# Patient Record
Sex: Male | Born: 1960
Health system: Southern US, Community
[De-identification: ages and names within clinical notes are randomized; demographics above are authoritative.]

## PROBLEM LIST (undated history)

## (undated) DIAGNOSIS — F32A Depression, unspecified: Secondary | ICD-10-CM

## (undated) DIAGNOSIS — R000 Tachycardia, unspecified: Secondary | ICD-10-CM

## (undated) DIAGNOSIS — I219 Acute myocardial infarction, unspecified: Secondary | ICD-10-CM

## (undated) DIAGNOSIS — G4730 Sleep apnea, unspecified: Secondary | ICD-10-CM

## (undated) DIAGNOSIS — I10 Essential (primary) hypertension: Secondary | ICD-10-CM

## (undated) DIAGNOSIS — I509 Heart failure, unspecified: Secondary | ICD-10-CM

## (undated) HISTORY — PX: OTHER SURGICAL HISTORY: SHX169

## (undated) HISTORY — PX: CHOLECYSTECTOMY: SHX55

## (undated) HISTORY — DX: Depression, unspecified: F32.A

## (undated) HISTORY — DX: Acute myocardial infarction, unspecified: I21.9

---

## 1998-09-17 ENCOUNTER — Emergency Department (HOSPITAL_COMMUNITY): Admission: EM | Admit: 1998-09-17 | Discharge: 1998-09-17 | Payer: Self-pay | Admitting: Emergency Medicine

## 2000-02-09 ENCOUNTER — Encounter: Payer: Self-pay | Admitting: Emergency Medicine

## 2000-02-09 ENCOUNTER — Emergency Department (HOSPITAL_COMMUNITY): Admission: EM | Admit: 2000-02-09 | Discharge: 2000-02-09 | Payer: Self-pay | Admitting: Emergency Medicine

## 2000-02-26 ENCOUNTER — Emergency Department (HOSPITAL_COMMUNITY): Admission: EM | Admit: 2000-02-26 | Discharge: 2000-02-27 | Payer: Self-pay | Admitting: Emergency Medicine

## 2000-02-27 ENCOUNTER — Emergency Department (HOSPITAL_COMMUNITY): Admission: EM | Admit: 2000-02-27 | Discharge: 2000-02-27 | Payer: Self-pay | Admitting: Emergency Medicine

## 2000-05-21 ENCOUNTER — Emergency Department (HOSPITAL_COMMUNITY): Admission: EM | Admit: 2000-05-21 | Discharge: 2000-05-21 | Payer: Self-pay

## 2001-05-23 ENCOUNTER — Encounter: Payer: Self-pay | Admitting: Emergency Medicine

## 2001-05-23 ENCOUNTER — Emergency Department (HOSPITAL_COMMUNITY): Admission: EM | Admit: 2001-05-23 | Discharge: 2001-05-23 | Payer: Self-pay | Admitting: Emergency Medicine

## 2002-11-27 ENCOUNTER — Emergency Department (HOSPITAL_COMMUNITY): Admission: EM | Admit: 2002-11-27 | Discharge: 2002-11-27 | Payer: Self-pay | Admitting: Emergency Medicine

## 2002-11-28 ENCOUNTER — Emergency Department (HOSPITAL_COMMUNITY): Admission: EM | Admit: 2002-11-28 | Discharge: 2002-11-28 | Payer: Self-pay | Admitting: Emergency Medicine

## 2003-05-03 ENCOUNTER — Inpatient Hospital Stay (HOSPITAL_COMMUNITY): Admission: EM | Admit: 2003-05-03 | Discharge: 2003-05-03 | Payer: Self-pay | Admitting: Emergency Medicine

## 2003-06-17 ENCOUNTER — Encounter: Admission: RE | Admit: 2003-06-17 | Discharge: 2003-06-17 | Payer: Self-pay | Admitting: Internal Medicine

## 2005-08-05 ENCOUNTER — Emergency Department (HOSPITAL_COMMUNITY): Admission: EM | Admit: 2005-08-05 | Discharge: 2005-08-06 | Payer: Self-pay | Admitting: Emergency Medicine

## 2006-01-10 ENCOUNTER — Ambulatory Visit: Payer: Self-pay | Admitting: Family Medicine

## 2006-01-24 ENCOUNTER — Ambulatory Visit: Payer: Self-pay | Admitting: *Deleted

## 2006-10-17 ENCOUNTER — Encounter (INDEPENDENT_AMBULATORY_CARE_PROVIDER_SITE_OTHER): Payer: Self-pay | Admitting: *Deleted

## 2006-11-15 ENCOUNTER — Emergency Department (HOSPITAL_COMMUNITY): Admission: EM | Admit: 2006-11-15 | Discharge: 2006-11-15 | Payer: Self-pay | Admitting: Emergency Medicine

## 2007-01-25 ENCOUNTER — Inpatient Hospital Stay (HOSPITAL_COMMUNITY): Admission: EM | Admit: 2007-01-25 | Discharge: 2007-01-29 | Payer: Self-pay | Admitting: Emergency Medicine

## 2007-01-25 ENCOUNTER — Encounter (INDEPENDENT_AMBULATORY_CARE_PROVIDER_SITE_OTHER): Payer: Self-pay | Admitting: Surgery

## 2008-10-11 ENCOUNTER — Emergency Department (HOSPITAL_COMMUNITY): Admission: EM | Admit: 2008-10-11 | Discharge: 2008-10-11 | Payer: Self-pay | Admitting: Emergency Medicine

## 2008-10-11 ENCOUNTER — Observation Stay (HOSPITAL_COMMUNITY): Admission: EM | Admit: 2008-10-11 | Discharge: 2008-10-12 | Payer: Self-pay | Admitting: Emergency Medicine

## 2008-10-13 ENCOUNTER — Emergency Department (HOSPITAL_COMMUNITY): Admission: EM | Admit: 2008-10-13 | Discharge: 2008-10-14 | Payer: Self-pay | Admitting: Emergency Medicine

## 2008-10-13 ENCOUNTER — Emergency Department (HOSPITAL_COMMUNITY): Admission: EM | Admit: 2008-10-13 | Discharge: 2008-10-13 | Payer: Self-pay | Admitting: Emergency Medicine

## 2008-10-16 ENCOUNTER — Ambulatory Visit: Payer: Self-pay | Admitting: Family Medicine

## 2008-10-16 ENCOUNTER — Encounter: Payer: Self-pay | Admitting: Family Medicine

## 2008-10-16 DIAGNOSIS — I10 Essential (primary) hypertension: Secondary | ICD-10-CM

## 2008-10-16 DIAGNOSIS — E785 Hyperlipidemia, unspecified: Secondary | ICD-10-CM

## 2008-10-16 DIAGNOSIS — E119 Type 2 diabetes mellitus without complications: Secondary | ICD-10-CM | POA: Insufficient documentation

## 2008-10-16 LAB — CONVERTED CEMR LAB
ALT: 26 units/L (ref 0–53)
AST: 25 units/L (ref 0–37)
Albumin: 4.2 g/dL (ref 3.5–5.2)
Alkaline Phosphatase: 61 units/L (ref 39–117)
BUN: 13 mg/dL (ref 6–23)
CO2: 23 meq/L (ref 19–32)
Calcium: 9.7 mg/dL (ref 8.4–10.5)
Chloride: 99 meq/L (ref 96–112)
Creatinine, Ser: 0.82 mg/dL (ref 0.40–1.50)
Direct LDL: 97 mg/dL
Glucose, Bld: 207 mg/dL — ABNORMAL HIGH (ref 70–99)
Hgb A1c MFr Bld: >14 %
Potassium: 4 meq/L (ref 3.5–5.3)
Sodium: 136 meq/L (ref 135–145)
Total Bilirubin: 0.4 mg/dL (ref 0.3–1.2)
Total Protein: 7.8 g/dL (ref 6.0–8.3)

## 2008-10-30 ENCOUNTER — Encounter: Payer: Self-pay | Admitting: Family Medicine

## 2008-10-30 ENCOUNTER — Ambulatory Visit: Payer: Self-pay | Admitting: Family Medicine

## 2008-10-30 DIAGNOSIS — M79609 Pain in unspecified limb: Secondary | ICD-10-CM | POA: Insufficient documentation

## 2008-10-30 DIAGNOSIS — M25569 Pain in unspecified knee: Secondary | ICD-10-CM | POA: Insufficient documentation

## 2008-12-16 ENCOUNTER — Telehealth: Payer: Self-pay | Admitting: Family Medicine

## 2008-12-17 ENCOUNTER — Telehealth: Payer: Self-pay | Admitting: *Deleted

## 2009-06-17 ENCOUNTER — Encounter: Payer: Self-pay | Admitting: *Deleted

## 2009-06-17 ENCOUNTER — Telehealth (INDEPENDENT_AMBULATORY_CARE_PROVIDER_SITE_OTHER): Payer: Self-pay | Admitting: *Deleted

## 2009-06-23 ENCOUNTER — Telehealth: Payer: Self-pay | Admitting: Family Medicine

## 2009-07-01 ENCOUNTER — Encounter: Payer: Self-pay | Admitting: *Deleted

## 2009-07-23 ENCOUNTER — Encounter: Admission: RE | Admit: 2009-07-23 | Discharge: 2009-07-23 | Payer: Self-pay | Admitting: Family Medicine

## 2009-08-18 ENCOUNTER — Emergency Department (HOSPITAL_COMMUNITY): Admission: EM | Admit: 2009-08-18 | Discharge: 2009-08-18 | Payer: Self-pay | Admitting: Emergency Medicine

## 2009-08-18 ENCOUNTER — Ambulatory Visit (HOSPITAL_COMMUNITY): Admission: RE | Admit: 2009-08-18 | Discharge: 2009-08-18 | Payer: Self-pay | Admitting: Interventional Cardiology

## 2009-08-18 ENCOUNTER — Encounter (INDEPENDENT_AMBULATORY_CARE_PROVIDER_SITE_OTHER): Payer: Self-pay | Admitting: Interventional Cardiology

## 2009-10-25 ENCOUNTER — Telehealth: Payer: Self-pay | Admitting: *Deleted

## 2009-11-03 ENCOUNTER — Ambulatory Visit (HOSPITAL_COMMUNITY): Admission: RE | Admit: 2009-11-03 | Discharge: 2009-11-03 | Payer: Self-pay | Admitting: Interventional Cardiology

## 2009-11-03 ENCOUNTER — Encounter (INDEPENDENT_AMBULATORY_CARE_PROVIDER_SITE_OTHER): Payer: Self-pay | Admitting: Interventional Cardiology

## 2009-11-04 ENCOUNTER — Encounter: Payer: Self-pay | Admitting: Family Medicine

## 2009-11-29 ENCOUNTER — Ambulatory Visit: Payer: Self-pay | Admitting: Family Medicine

## 2009-11-29 DIAGNOSIS — M549 Dorsalgia, unspecified: Secondary | ICD-10-CM | POA: Insufficient documentation

## 2009-12-06 ENCOUNTER — Telehealth: Payer: Self-pay | Admitting: Pharmacist

## 2010-02-09 ENCOUNTER — Telehealth: Payer: Self-pay | Admitting: Family Medicine

## 2010-02-20 ENCOUNTER — Encounter: Payer: Self-pay | Admitting: Internal Medicine

## 2010-03-03 NOTE — Progress Notes (Signed)
Summary: triage   Phone Note Call from Patient Call back at [phone]   Caller: Patient Summary of Call: Pt needs a couple of lantus pens due to he can not afford to get them or even come in for an appointment. Initial call taken by: Clydell Hakim,  Jun 23, 2009 11:01 AM  Follow-up for Phone Call        reviewed the conversations we had about this before. states he will bring $25 to appt Fri. will see Dr. Lanier Prude.   plz discuss dnkas & completing the [facility] application Follow-up by: Golden Circle RN,  Jun 23, 2009 11:08 AM

## 2010-03-03 NOTE — Letter (Signed)
Summary: Probation Letter  [facility]  [address]   [place], [place] [address]   Phone: [phone]  Fax: [phone]    07/01/2009  Cody Nolan [address] [place], [place]  [address]  Dear Cody Nolan,  With the goal of better serving all our patients the [facility] is following each patient's missed appointments.  You have missed at least 3 appointments with our practice.If you cannot keep your appointment, we expect you to call at least 24 hours before your appointment time.  Missing appointments prevents other patients from seeing [place] and makes it difficult to provide you with the best possible medical care.      1.   If you miss one more appointment, we will only give you limited medical services. This means we will not call in medication refills, complete a form, or make a referral for you except when you are here for a scheduled office visit.    2.   If you miss 2 or more appointments in the next year, we will dismiss you from our practice.    Our office staff can be reached at [phone] Monday through Friday from 8:30 a.m.-5:00 p.m. and will be glad to schedule your appointment as necessary.    Thank you.   [facility]  Appended Document: Probation Letter Letter returned unable to forward.

## 2010-03-03 NOTE — Progress Notes (Signed)
Summary: triage   Phone Note Call from Patient Call back at Home Phone [phone]   Caller: Patient Summary of Call: Wondering if he can get some insulin samples. Initial call taken by: Clydell Hakim,  October 25, 2009 8:42 AM  Follow-up for Phone Call        he will pick samples up tomorrow Follow-up by: Golden Circle RN,  October 25, 2009 8:56 AM

## 2010-03-03 NOTE — Progress Notes (Signed)
Summary: triage   Phone Note Call from Patient Call back at [phone]   Caller: Patient Summary of Call: Pt wondering if we have any Lantus pens. He is out and was told to call us if he ran out. Initial call taken by: Clydell Hakim,  Jun 17, 2009 11:02 AM  Follow-up for Phone Call        he has not been here since 10/10. tried to get Jaynee Eagles but is missing the notarized statement that he is living with grandmom.  But, he proceeded to say she was in [place] with his aunt. the grandma does not walk & no one will come the the [place] to [place]arize it. he does not have a plan to resolve this.  told him I will check with pcp & will call him back with her response.   states he has been without his insulin for months but feels it is up now. send to ED? Follow-up by: Golden Circle RN,  Jun 17, 2009 11:16 AM  Additional Follow-up for Phone Call Additional follow up Details #1::        He needs to have an office visit to discuss this.  when I check the appt tab, it shows that he has no showed for 3 appts; so I am unwilling to provide any services over the phone.  he must come in for follow up.  thanks Additional Follow-up by: Asher Muir MD,  Jun 17, 2009 11:49 AM    Additional Follow-up for Phone Call Additional follow up Details #2::    told him he must be seen. cannot come today . wanted am appt with pcp next week. told him he really needed to be seen or do to ED today or tomorrow. he is helping someone m,ove tomorrow & refused an appt. appt for monday at 8:30. aware he will not be seeing his pcp. told him to go to ED if feeling bad  he needs to get the [facility] requirements DONE.   multiple DNKAs. to Dennison Nancy, RN, Chiropodist  spoke with Abundio Miu. He will need to bring at least $25 with him to the appt. I called him right back buit had to leave a VM asking him to call me Follow-up by: Golden Circle RN,  Jun 17, 2009 12:03 PM   Appended Document:  triage told him to bring $25 to visit next week. he said he would work on getting this & hung up

## 2010-03-03 NOTE — Miscellaneous (Signed)
Summary: needs insulin   Clinical Lists Changes states he only got one pen last time he requested them. told him we are dependant on how many samples we get & how many are given away by other mds in clinic. apparantley we only had one to give him. asked if he had another plan for getting his insulin. states he does but needs more samples. told him about the [facility] & probation staus. offered him an appt tomorrow. says his back hurts so bad he cannot get out of bed. took appt next wk with his pcp.Golden Circle RN  November 04, 2009 3:32 PM

## 2010-03-03 NOTE — Progress Notes (Signed)
Summary: needs sample   Phone call to patient. Discussed need to find a way to get a notorized living situaton document for MAP.   Called and provided one pen for next week - until his appt with Dr. Ellin Mayhew.   Phone Note Call from Patient Call back at Home Phone [phone]   Caller: Patient Summary of Call: needs another samples of Lantus of 260 units Initial call taken by: De Nurse,  December 06, 2009 9:05 AM  Follow-up for Phone Call        I discussed with patient at his last appointment that we do not have samples available for him for chronic use.  He has medicaid which covers  it.  Please ask him to pickup his refill from the pharmacy and if he woudl like for me to prescribe the vials instead of the lantus pen I would be glad to do so. Follow-up by: Delbert Harness MD,  December 07, 2009 10:01 AM  Additional Follow-up for Phone Call Additional follow up Details #1::        Spoke with pt and he states that because he doesn't have the [place] access medicaid they will only pay for insulin when rx'ed during cpe visit. He states that medicaid will not cover his pen and that even if he is rx'ed vials it will not make a difference. He also states that he is out of insulin and is in need of badly Additional Follow-up by: Jimmy Footman, CMA,  December 07, 2009 10:44 AM    Additional Follow-up for Phone Call Additional follow up Details #2::    Please let patient know we do not have samples to give him.  Ask him how much his copay is for lantus.  Tell him info to contact Rudell Cobb to discuss if he qualifies for any other assistance.  Let him know if he cannot afford lantus, that we can discuss other insulins if they are less expensive for the patient (office appt). thanks  Additional Follow-up for Phone Call Additional follow up Details #3:: Details for Additional Follow-up Action Taken: spoke with pt and gave him phone number for [facility], he will contact her but he informed me that  he spoke with pete and he told the pt that he should never be without insulin and if he needed some to ask him,. i will send a flag to pete. also he has an appt for the 22nd i think Additional Follow-up by: Loralee Pacas CMA,  December 08, 2009 3:37 PM

## 2010-03-03 NOTE — Miscellaneous (Signed)
Summary: he cancelled the appt   Clinical Lists Changes he called back & said to cancel the appt . he has no money. urged him to try to get with [facility] earlier to get that. told him there are other clinics he can try that may have a sliding scale. he said he would be back here when he got the money.Golden Circle RN  Jun 17, 2009 4:46 PM

## 2010-03-03 NOTE — Assessment & Plan Note (Signed)
Summary: pain & get insulin/Quin[place]/briscoe   Vital Signs:  Patient profile:   50 year old male Weight:      266 pounds Temp:     98.9 degrees F oral Pulse rate:   99 / minute Pulse rhythm:   regular BP sitting:   108 / 81  (right arm) Cuff size:   large  Vitals Entered By: Loralee Pacas CMA (November 29, 2009 9:53 AM) CC: refill meds Is Patient Diabetic? Yes Did you bring your meter with you today? No   Primary Care Shaira Sova:  Cody Harness MD  CC:  refill meds.  History of Present Illness: 50 yo here to meet new MD.  Has not been seen in over a year.  DIABETES Meds:Metformin 500 two times a day, if takes more, gets GI upset, Lantus, glimepiride Taking and tolerating? yes Blood sugars:not checking regularly Hypoglycemic symptoms: no Visual problems: no Monitoring feet: some Numbness/Tingling: no Last eye exam: overdue Last A1c: > 14 a year ago Flu/PNA vaccines? deferred  HYPERTENSION Meds: Taking and tolerating? yes Home BP's: no Chest Pain: no Dyspnea: no  HYPERLIPIDEMIA Meds: simva 40 Taking and tolerating? 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  No LE weakness, radiculpathy, bowel or bladder changes,  numbness tingling, loss of sensation.  managing with tylenol.       Habits & Providers  Alcohol-Tobacco-Diet     Tobacco Status: current     Tobacco Counseling: to quit use of tobacco products     Cigarette Packs/Day: 1.5 per week  Exercise-Depression-Behavior     Does Patient Exercise: no     Exercise Counseling: to improve exercise regimen     Have you felt down or hopeless? no     Have you felt little pleasure in things? no     Depression Counseling: not indicated; screening negative for depression     Seat Belt Use: always  Current Medications (verified): 1)  Metformin Hcl 1000 Mg Tabs (Metformin Hcl) .... Take One Half Tablet Twice A Day 2)   Metoprolol Tartrate 25 Mg Tabs (Metoprolol Tartrate) .Marland Kitchen.. 1 Tab By Mouth Two Times A Day 3)  Simvastatin 40 Mg Tabs (Simvastatin) .Marland Kitchen.. 1 Tab By Mouth At Bedtime For Cholesterol 4)  Ramipril 2.5 Mg Caps (Ramipril) .... Take One Tablet Daily 5)  Aspirin 81 Mg Chew (Aspirin) .Marland Kitchen.. 1 Tab By Mouth Daily 6)  Lantus Solostar 100 Unit/ml Soln (Insulin Glargine) .... 30 Units Subcutaneously in The Morning.  Dispense Qs One Month 7)  Relion Confirm Glucose Monitor W/device Kit (Blood Glucose Monitoring Suppl) 8)  Relion Blood Glucose Test  Strp (Glucose Blood) .... Test Blood Sugar As Directed 9)  Glimepiride 4 Mg Tabs (Glimepiride) .Marland Kitchen.. 1 Tab By Mouth Daily For Diabetes 10)  Diclofenac Potassium 50 Mg Tabs (Diclofenac Potassium) .... Take One Tablet Three Times A Day As Needed Pain 11)  Amitriptyline Hcl 50 Mg Tabs (Amitriptyline Hcl) .... 2 Tabs As Needed Bedtime  Past History:  Past Medical History: diabetes mellitus MI at in 2003; no stent allergic rhinitis htn fracture of L5 in his 33s.  no surgery.  had a TENS unit hld some type of arrhythmia  Social History: Smoking Status:  current Packs/Day:  1.5 per week Does Patient Exercise:  no Seat Belt Use:  always  Review of Systems      See HPI  Physical Exam  General:  Well-developed,overweight,  pleasant and cooperative Neck:  No deformities, masses, or tenderness noted. Lungs:  Normal respiratory effort, chest expands symmetrically. Lungs are clear to auscultation, no crackles or wheezes. Heart:  Normal rate and regular rhythm. S1 and S2 normal without gallop, murmur, click, rub or other extra sounds. Abdomen:  Bowel sounds positive,abdomen soft and non-tender without masses, organomegaly or hernias noted. Msk:  no TTP over spine Extremities:  no edema Neurologic:  alert & oriented X3, cranial nerves II-XII intact, and DTRs symmetrical and normal.   Psych:  Oriented X3, memory intact for recent and remote, and normally interactive.       Impression & Recommendations:  Problem # 1:  DM (ICD-250.00) History of being poorly controlled.  He states he has been out  meds recently due to being out of meds and having difficulty financially.  Discussed with patient that medicaid will cover his medications.  I gave him one Lantus pen, he prefers this to the syringes.  Will refill meds. Patient feels like he woul dbenefit from nutritional education.  He has been to diabetes educationi in the past.  He is educated and would likely gain a great deal from nutrition consult.  Will refer to Dr. Gerilyn Pilgrim.  His updated medication list for this problem includes:    Metformin Hcl 1000 Mg Tabs (Metformin hcl) .Marland Kitchen... Take one half tablet twice a day    Ramipril 2.5 Mg Caps (Ramipril) .Marland Kitchen... Take one tablet daily    Aspirin 81 Mg Chew (Aspirin) .Marland Kitchen... 1 tab by mouth daily    Lantus Solostar 100 Unit/ml Soln (Insulin glargine) .Marland KitchenMarland KitchenMarland KitchenMarland Kitchen 30 units subcutaneously in the morning.  dispense qs one month    Glimepiride 4 Mg Tabs (Glimepiride) .Marland Kitchen... 1 tab by mouth daily for diabetes  Orders: [facility]- Est  Level 4 (99214)Future Orders: A1C-[facility] ([address]) ... 11/11/2010 CBC-[facility] (60454) ... 11/11/2010 Comp Met-[facility] ([id number]) ... 11/26/2010  Labs Reviewed: Creat: 0.82 (10/16/2008)    Reviewed HgBA1c results: >14.0  (10/16/2008)  Problem # 2:  ESSENTIAL HYPERTENSION, BENIGN (ICD-401.1) BP is on the low side but would like to keep low level of ACE-I due to DM and CAD.  He thought he did not tolerate lisinopril well but I'm not sure it was an ACE-I cough.  he would be willing to try another ACE-I so will switch to ramipril before diagnosing ACE-I intolerance.   His updated medication list for this problem includes:    Metoprolol Tartrate 25 Mg Tabs (Metoprolol tartrate) .Marland Kitchen... 1 tab by mouth two times a day    Ramipril 2.5 Mg Caps (Ramipril) .Marland Kitchen... Take one tablet daily  Orders: [facility]- Est  Level 4 (99214)Future Orders: CBC-[facility] ([address]) ... 11/11/2010 Lipid-[facility]  ([id number]) ... 11/11/2010 Comp Met-[facility] ([id number]) ... 11/26/2010  BP today: 108/81 Prior BP: 110/76 (10/30/2008)  Labs Reviewed: K+: 4.0 (10/16/2008) Creat: : 0.82 (10/16/2008)     Problem # 3:  HYPERLIPIDEMIA (ICD-272.4) Will return for fasting lipids.  His updated medication list for this problem includes:    Simvastatin 40 Mg Tabs (Simvastatin) .Marland Kitchen... 1 tab by mouth at bedtime for cholesterol  Orders: [facility]- Est  Level 4 (99214)Future Orders: CBC-[facility] (24401) ... 11/11/2010 Lipid-[facility] ([id number]) ... 11/11/2010 Comp Met-[facility] ([id number]) ... 11/26/2010  Problem # 4:  BACK PAIN (ICD-724.5)  chronic- he asks about referral to a pain clinic- possibly for back injections.  I asked patient to obtain records or some imaging from prior physician and we will discuss referral at the next visit.  His updated medication list for this  problem includes:    Aspirin 81 Mg Chew (Aspirin) .Marland Kitchen... 1 tab by mouth daily    Diclofenac Potassium 50 Mg Tabs (Diclofenac potassium) .Marland Kitchen... Take one tablet three times a day as needed pain  Orders: [facility]- Est  Level 4 (16109)  Complete Medication List: 1)  Metformin Hcl 1000 Mg Tabs (Metformin hcl) .... Take one half tablet twice a day 2)  Metoprolol Tartrate 25 Mg Tabs (Metoprolol tartrate) .Marland Kitchen.. 1 tab by mouth two times a day 3)  Simvastatin 40 Mg Tabs (Simvastatin) .Marland Kitchen.. 1 tab by mouth at bedtime for cholesterol 4)  Ramipril 2.5 Mg Caps (Ramipril) .... Take one tablet daily 5)  Aspirin 81 Mg Chew (Aspirin) .Marland Kitchen.. 1 tab by mouth daily 6)  Lantus Solostar 100 Unit/ml Soln (Insulin glargine) .... 30 units subcutaneously in the morning.  dispense qs one month 7)  Relion Confirm Glucose Monitor W/device Kit (Blood glucose monitoring suppl) 8)  Relion Blood Glucose Test Strp (Glucose blood) .... Test blood sugar as directed 9)  Glimepiride 4 Mg Tabs (Glimepiride) .Marland Kitchen.. 1 tab by mouth daily for diabetes 10)  Diclofenac Potassium 50 Mg Tabs (Diclofenac  potassium) .... Take one tablet three times a day as needed pain 11)  Amitriptyline Hcl 50 Mg Tabs (Amitriptyline hcl) .... 2 tabs as needed bedtime  Patient Instructions: 1)  Nice to meet you 2)  Your medicaid should cover lantus solostar pen.  I will send refil to your pharmacy.  if it does not cover, let me know and I wil prescribe the vials. 3)  Make appt up front for Dr. Gerilyn Pilgrim for nutrition consultation 4)  obtain records of any back imaging and records from your last doctor and bring to next appointment 5)  make appt for fasting labs this week.  We will discuss the results at your next appointment 6)  follow-up in 3-4 weeks Prescriptions: AMITRIPTYLINE HCL 50 MG TABS (AMITRIPTYLINE HCL) 2 tabs as needed bedtime  #60 x 5   Entered and Authorized by:   Cody Harness MD   Signed by:   Cody Harness MD on 12/02/2009   Method used:   Electronically to        [facility] [address] (retail)       [phone]       [place], [place]  [address]       Ph: [phone]       Fax: [phone]   RxID:   [id number] LANTUS SOLOSTAR 100 UNIT/ML SOLN (INSULIN GLARGINE) 30 units subcutaneously in the morning.  Dispense QS one month  #1 x 5   Entered and Authorized by:   Cody Harness MD   Signed by:   Cody Harness MD on 12/02/2009   Method used:   Electronically to        [facility] [address] (retail)       [phone]       [place], [place]  [address]       Ph: [phone]       Fax: [phone]   RxID:   [id number] GLIMEPIRIDE 4 MG TABS (GLIMEPIRIDE) 1 tab by mouth daily for diabetes  #30 x 5   Entered and Authorized by:   Cody Harness MD   Signed by:   Cody Harness MD on 12/02/2009   Method used:   Electronically to        [facility] [address] (retail)       [phone]       [place], [place]  [address]       Ph: [phone]       Fax: [phone]   RxID:    [id number] SIMVASTATIN 40 MG TABS (SIMVASTATIN) 1 tab by mouth at bedtime for cholesterol  #30 x 1   Entered and Authorized by:   Cody Harness MD   Signed by:   Cody Harness MD on 12/02/2009   Method used:   Electronically to        [facility] [address] (retail)       [phone]       [place], [place]  [address]       Ph: [phone]       Fax: [phone]   RxID:   [id number] METOPROLOL TARTRATE 25 MG TABS (METOPROLOL TARTRATE) 1 tab by mouth two times a day  #60 x 1   Entered and Authorized by:   Cody Harness MD   Signed by:   Cody Harness MD on 12/02/2009   Method used:   Electronically to        [facility] [address] (retail)       [phone]       [place], [place]  [address]       Ph: [phone]       Fax: [phone]   RxID:   [id number] METFORMIN HCL 1000 MG TABS (METFORMIN HCL) take one half tablet twice a day  #30 x 5   Entered and Authorized by:   Cody Harness MD   Signed by:   Cody Harness MD on 12/02/2009   Method used:   Electronically to        [facility] [address] (retail)       [phone]       [place], [place]  [address]       Ph: [phone]       Fax: [phone]   RxID:   [id number] DICLOFENAC POTASSIUM 50 MG TABS (DICLOFENAC POTASSIUM) take one tablet three times a day as needed pain  #90 x 2   Entered and Authorized by:   Cody Harness MD   Signed by:   Cody Harness MD on 12/02/2009   Method used:   Electronically to        [facility] [address] (retail)       [phone]       [place], [place]  [address]       Ph: [phone]       Fax: [phone]   RxID:   [id number] RAMIPRIL 2.5 MG CAPS (RAMIPRIL) take one tablet daily  #30 x 5   Entered and Authorized by:   Cody Harness MD   Signed by:   Cody Harness MD on 12/02/2009   Method used:   Electronically to        [facility]  [address] (retail)       [phone]       [place], [place]  [address]       Ph: [phone]       Fax: [phone]   RxID:  [id number]    Orders Added: 1)  A1C-[facility] [83036] 2)  CBC-[facility] [85027] 3)  Lipid-[facility] [80061-22930] 4)  Comp Met-[facility] [80053-22900] 5)  [facility]- Est  Level 4 [30865]     Prevention & Chronic Care Immunizations   Influenza vaccine: Not documented    Tetanus booster: Not documented    Pneumococcal vaccine: Not documented  Other Screening   PSA: Not documented   Smoking status: current  (11/29/2009)  Diabetes Mellitus   HgbA1C: >14.0   (10/16/2008)    Eye exam: Not documented    Foot exam: yes  (10/30/2008)   High risk foot: Not documented   Foot care education: Not documented    Urine microalbumin/creatinine ratio: Not documented  Lipids   Total Cholesterol: Not documented   LDL: Not documented   LDL Direct: 97  (10/16/2008)   HDL: Not documented   Triglycerides: Not documented    SGOT (AST): 25  (10/16/2008)   SGPT (ALT): 26  (10/16/2008) CMP ordered    Alkaline phosphatase: 61  (10/16/2008)   Total bilirubin: 0.4  (10/16/2008)  Hypertension   Last Blood Pressure: 108 / 81  (11/29/2009)   Serum creatinine: 0.82  (10/16/2008)   Serum potassium 4.0  (10/16/2008) CMP ordered   Self-Management Support :    Diabetes self-management support: Not documented    Hypertension self-management support: Not documented    Lipid self-management support: Not documented

## 2010-03-03 NOTE — Progress Notes (Signed)
Summary: REFILL  Medications Added METFORMIN HCL 1000 MG TABS (METFORMIN HCL) one tablet twice a day       Phone Note Refill Request Call back at Home Phone [phone] Message from:  Patient  Refills Requested: Medication #1:  METFORMIN HCL 1000 MG TABS take one half tablet twice a day   Notes: IS NOW TAKING 2 PER DAY Initial call taken by: De Nurse,  February 09, 2010 8:34 AM    New/Updated Medications: METFORMIN HCL 1000 MG TABS (METFORMIN HCL) one tablet twice a day Prescriptions: METFORMIN HCL 1000 MG TABS (METFORMIN HCL) one tablet twice a day  #60 x 5   Entered and Authorized by:   Delbert Harness MD   Signed by:   Delbert Harness MD on 02/09/2010   Method used:   Electronically to        [facility] [address] (retail)       [phone]       [place], [place]  [address]       Ph: [phone]       Fax: [phone]   RxID:   [id number]

## 2010-04-14 ENCOUNTER — Telehealth: Payer: Self-pay | Admitting: Family Medicine

## 2010-04-14 NOTE — Telephone Encounter (Signed)
Called pt but line was busy. Wanted to find out which Rx's he was speaking of so that we can be sure to call in the correct ones. I will try again tomorrow.Loralee Pacas [place]

## 2010-04-14 NOTE — Telephone Encounter (Signed)
Pt asking to speak with RN about getting 2 of his meds on the [facility] $4 list, pt cannot afford the ones prescribed.

## 2010-04-15 NOTE — Telephone Encounter (Signed)
Yes, we can go over all of his medications and choose ones that are affordable for him.  Please have him schedule appointment to discuss.  He did not keep last appointment.

## 2010-04-15 NOTE — Telephone Encounter (Signed)
Cody Nolan, Patient is wanting to know if there is any medicines on the [facility] $4 that be be prescribed in place of Ramipril 2.5 and Simvastain 40.

## 2010-04-15 NOTE — Telephone Encounter (Signed)
LVM for patient to call back. ?

## 2010-04-18 NOTE — Telephone Encounter (Signed)
LVM for patient to call back. ?

## 2010-04-18 NOTE — Telephone Encounter (Signed)
Due to numerous phone calls made to patient and no response I have mailed out a letter to him

## 2010-05-06 LAB — POCT I-STAT, CHEM 8
BUN: 12 mg/dL (ref 6–23)
BUN: 8 mg/dL (ref 6–23)
Calcium, Ion: 1.11 mmol/L — ABNORMAL LOW (ref 1.12–1.32)
Calcium, Ion: 1.18 mmol/L (ref 1.12–1.32)
Chloride: 101 mEq/L (ref 96–112)
Chloride: 99 mEq/L (ref 96–112)
Creatinine, Ser: 0.6 mg/dL (ref 0.4–1.5)
Glucose, Bld: 436 mg/dL — ABNORMAL HIGH (ref 70–99)
Glucose, Bld: 602 mg/dL (ref 70–99)
HCT: 43 % (ref 39.0–52.0)
HCT: 46 % (ref 39.0–52.0)
HCT: 47 % (ref 39.0–52.0)
Hemoglobin: 14.6 g/dL (ref 13.0–17.0)
Hemoglobin: 16 g/dL (ref 13.0–17.0)
Potassium: 3.9 mEq/L (ref 3.5–5.1)
Potassium: 4 mEq/L (ref 3.5–5.1)
Potassium: 4.6 mEq/L (ref 3.5–5.1)
Sodium: 126 mEq/L — ABNORMAL LOW (ref 135–145)
Sodium: 133 mEq/L — ABNORMAL LOW (ref 135–145)
TCO2: 23 mmol/L (ref 0–100)
TCO2: 28 mmol/L (ref 0–100)

## 2010-05-06 LAB — DIFFERENTIAL
Basophils Absolute: 0 10*3/uL (ref 0.0–0.1)
Basophils Relative: 0 % (ref 0–1)
Lymphocytes Relative: 21 % (ref 12–46)
Lymphocytes Relative: 27 % (ref 12–46)
Lymphs Abs: 2.3 10*3/uL (ref 0.7–4.0)
Monocytes Absolute: 0.9 10*3/uL (ref 0.1–1.0)
Neutro Abs: 5.6 10*3/uL (ref 1.7–7.7)
Neutro Abs: 7.5 10*3/uL (ref 1.7–7.7)
Neutrophils Relative %: 59 % (ref 43–77)
Neutrophils Relative %: 68 % (ref 43–77)

## 2010-05-06 LAB — TSH: TSH: 1.934 u[IU]/mL (ref 0.350–4.500)

## 2010-05-06 LAB — CBC
Hemoglobin: 14.1 g/dL (ref 13.0–17.0)
Hemoglobin: 14.2 g/dL (ref 13.0–17.0)
MCHC: 34.1 g/dL (ref 30.0–36.0)
Platelets: 208 10*3/uL (ref 150–400)
RBC: 4.71 MIL/uL (ref 4.22–5.81)
RDW: 13.7 % (ref 11.5–15.5)
RDW: 14.1 % (ref 11.5–15.5)

## 2010-05-06 LAB — BLOOD GAS, ARTERIAL
Acid-Base Excess: 0.6 mmol/L (ref 0.0–2.0)
O2 Saturation: 93.9 %
Patient temperature: 98.6
pCO2 arterial: 41.5 mmHg (ref 35.0–45.0)

## 2010-05-06 LAB — GLUCOSE, CAPILLARY
Glucose-Capillary: 163 mg/dL — ABNORMAL HIGH (ref 70–99)
Glucose-Capillary: 200 mg/dL — ABNORMAL HIGH (ref 70–99)
Glucose-Capillary: 230 mg/dL — ABNORMAL HIGH (ref 70–99)
Glucose-Capillary: 248 mg/dL — ABNORMAL HIGH (ref 70–99)
Glucose-Capillary: 251 mg/dL — ABNORMAL HIGH (ref 70–99)
Glucose-Capillary: 254 mg/dL — ABNORMAL HIGH (ref 70–99)
Glucose-Capillary: 291 mg/dL — ABNORMAL HIGH (ref 70–99)
Glucose-Capillary: 310 mg/dL — ABNORMAL HIGH (ref 70–99)
Glucose-Capillary: 417 mg/dL — ABNORMAL HIGH (ref 70–99)
Glucose-Capillary: 438 mg/dL — ABNORMAL HIGH (ref 70–99)
Glucose-Capillary: 480 mg/dL — ABNORMAL HIGH (ref 70–99)

## 2010-05-06 LAB — URINALYSIS, ROUTINE W REFLEX MICROSCOPIC
Hgb urine dipstick: NEGATIVE
Leukocytes, UA: NEGATIVE
Nitrite: NEGATIVE
Protein, ur: NEGATIVE mg/dL
Specific Gravity, Urine: 1.037 — ABNORMAL HIGH (ref 1.005–1.030)
Urobilinogen, UA: 0.2 mg/dL (ref 0.0–1.0)

## 2010-05-06 LAB — POCT CARDIAC MARKERS
CKMB, poc: 2.2 ng/mL (ref 1.0–8.0)
Myoglobin, poc: 69.2 ng/mL (ref 12–200)
Troponin i, poc: <0.05 ng/mL (ref 0.00–0.09)

## 2010-05-06 LAB — URINE MICROSCOPIC-ADD ON

## 2010-05-06 LAB — CK TOTAL AND CKMB (NOT AT ARMC): Relative Index: 1.9 (ref 0.0–2.5)

## 2010-05-06 LAB — T4, FREE: Free T4: 1.15 ng/dL (ref 0.80–1.80)

## 2010-05-06 LAB — TROPONIN I: Troponin I: 0.01 ng/mL (ref 0.00–0.06)

## 2010-06-14 NOTE — H&P (Signed)
NAME:  Cody Nolan, Cody Nolan NO.:  [id number]   MEDICAL RECORD NO.:  [id number]          PATIENT TYPE:  EMS   LOCATION:  ED                           FACILITY:  [facility]   PHYSICIAN:  Clovis Pu. Cornett, M.D.DATE OF BIRTH:  [birth year]   DATE OF ADMISSION:  01/25/2007  DATE OF DISCHARGE:                              HISTORY & PHYSICAL   CHIEF COMPLAINT:  Right upper quadrant pain.   HISTORY OF PRESENT ILLNESS:  The patient is a 50 year old male with a 12-  hour history of severe sharp right upper quadrant pain.  The pain  started suddenly at 3:00 a.m. this morning and awoke him from sleep.  The pain is sharp in nature at 10/10, radiates to his back and then  towards his right lower quadrant.  Also has pain in his epigastric.  Nothing seems to make the pain go away.  It is associated nausea,  vomiting and chills.  I was asked to see him at the request has  emergency room because of these findings and because his ultrasound  showed gallstones and thickened gallbladder wall worrisome for acute  cholecystitis.  He has a history of hypertension essential and  hypercholesterolemia and questionable history of coronary artery  disease.   PAST SURGICAL HISTORY:  Right knee arthroscopy.   FAMILY HISTORY:  Noncontributory.   PAST MEDICAL HISTORY:  1. Questionable coronary artery disease.  2. Essentially hypertension.  3. Hypercholesterolemia.   ALLERGIES:  PENICILLIN, SULFA, EGG WHITES AND EGGS.   SOCIAL HISTORY:  Denies tobacco or alcohol use.  He is accompanied by  his wife.   MEDICATIONS:  Norvasc and Toprol XL, but he is not currently taking this  medication.   REVIEW OF SYSTEMS:  Negative times 15 except for that as stated above.   PHYSICAL EXAMINATION:  VITAL SIGNS:  Temperature 97, pulse 101, blood  pressure 117/63.  GENERAL APPEARANCE:  Pleasant male in no apparent distress with chills  underneath his coat and blanket.  HEENT:  No evidence of scleral icterus.   Oropharynx is moist.  NECK:  Supple, nontender.  CHEST:  Clear to auscultation.  Chest wall motion is normal.  CARDIOVASCULAR:  Regular rate and rhythm without murmur or gallop.  EXTREMITIES:  Warm, well-perfused.  ABDOMEN:  He is tender to upper quadrant with positive Murphy's sign.  No mass.  No hernia.  EXTREMITIES:  Muscle tone normal range of motion normal.  NEUROLOGICAL:  Glasgow coma scale is 15.  Motor and sensory function are  grossly intact.   DIAGNOSTIC STUDIES:  Reviewed his ultrasound which shows gallstones and  thickened gallbladder wall.  He has a white count 10,900, hemoglobin  13.8, platelet count 274,000.  There is no left shift.  Urinalysis is  unremarkable.  Sodium 139, potassium 3.9, chloride 106, CO2 24, BUN 7,  creatinine 0.29, glucose 135, lipase is 23.  His AST is 19, ALT 20, alk  phos normal at 59.   IMPRESSION:  Acute cholecystitis with past medical history of  hypertension, questionable heart disease and hypercholesterolemia.   PLAN:  Medicine has seen him and cleared him  for surgery.  Will go ahead  and get him scheduled for a laparoscopic cholecystectomy with  cholangiogram for acute cholecystitis.  Risk of bleeding, infection,  common duct injury, injury to other organs to include stomach, small-  bowel, liver were all discussed with the patient today as well as the  need for conversion to an open procedure and the potential need for ERCP  and other procedures on the common bile duct if he has common duct  stones.  He understands and agrees to proceed.      Thomas A. Cornett, M.D.  Electronically Signed     TAC/MEDQ  D:  01/25/2007  T:  01/25/2007  Job:  [id number]

## 2010-06-14 NOTE — Op Note (Signed)
NAME:  Cody Nolan, CARD NO.:  [id number]   MEDICAL RECORD NO.:  [id number]          PATIENT TYPE:  INP   LOCATION:  1317                         FACILITY:  [facility]   PHYSICIAN:  Clovis Pu. Cornett, M.D.DATE OF BIRTH:  Mar 02, 1960   DATE OF PROCEDURE:  01/25/2007  DATE OF DISCHARGE:                               OPERATIVE REPORT   PREOPERATIVE DIAGNOSIS:  Acute cholecystitis.   POSTOPERATIVE DIAGNOSIS:  Acute gangrenous cholecystitis.   PROCEDURE:  Laparoscopic cholecystectomy with intraoperative  cholangiogram.   SURGEON:  Thomas A. Cornett, M.D.   ASSISTANT:  OR staff.   ANESTHESIA:  General endotracheal anesthesia with 0.25% Sensorcaine  local.   ESTIMATED BLOOD LOSS:  50 mL.   DRAINS:  None.   SPECIMEN:  Gallbladder with large gallstones to pathology.   INDICATIONS FOR PROCEDURE:  The patient is a 50 year old male with a one  week history of abdominal pain, nausea, and vomiting.  He was found to  have acute cholecystitis upon examination and ultrasound workup.  Laparoscopic cholecystectomy is recommended for acute cholecystitis.  He  received medical clearance, given his history of hypertension and  hypercholesterolemia.  He was given a moderate risks of adverse cardiac  events given his comorbidities.  Complications of bleeding, infection,  common duct injury, conversion to an open procedure were all discussed  with the patient.  He voiced understanding and agreed to proceed.   DESCRIPTION OF PROCEDURE:  The patient was brought to the operating room  and placed supine.  After induction of general endotracheal anesthesia,  his abdomen was prepped and draped in a sterile fashion.  A 1 cm  supraumbilical incision was made.  Dissection was carried down to his  fascia. His fascia was grasped with Kochers and a small incision was  made in the fascia using a scalpel.  I used my finger to push through  the peritoneal lining in the abdominal cavity and swept  around and found  no intra-abdominal adhesions.  A pursestring suture of 0 Vicryl was  placed, a 12 mm Hassan cannula was placed under direct vision.  Pneumoperitoneum was created to 15 mmHg of CO2 and the laparoscope was  placed.  The gallbladder was acutely inflamed.  A subxiphoid port  consisting of an 11 mL port was placed under direct vision.  Two 5 mm  ports were placed in the right upper quadrant, both under direct vision.   The gallbladder was decompressed with a decompression needle.  I then  grasped the dome and pushed it toward the patient's right shoulder.  A  second grasper was used to grasp the infundibulum of the gallbladder and  pull it toward the right lower quadrant.  The patient was placed in  reversed Trendelenburg and rolled to his left.  Of note, he did have  some transient hypotension during the case which responded to a very low  dose of Neo-Synephrine and IV fluids.  The gallbladder was edematous and  acutely inflamed.  I was able to push the fat away from the neck of the  gallbladder to identify the cystic duct and then dissect  the cystic duct  circumferentially as the only tubular structure entering the  gallbladder.  I was able to make a small incision in the cystic duct  after achieving a critical view.  After opening the cystic duct  partially, through a separate stab wound, a Cook cholangiogram catheter  was introduced and held in place in the cystic duct via clip.  Intraoperative cholangiogram using one 1/2 strength Hypaque dye was  used.  There was free flow of contrast down a tortuous cystic duct into  the common duct with immediate flushing into the duodenum.  Contrast  went up the common hepatic duct and into the bifurcation of right and  left hepatic duct and ductules.  No signs of extravasation injury or a  stone that I could see.  There was minimal dilatation.   At this point, I removed the catheter.  I placed four clips on the  cystic duct stump  and divided it.  The cystic artery identified, it was  controlled between clips and divided.  There was a posterior branch, as  well, which we controlled with clips.  Cautery was used to dissect the  gallbladder from the gallbladder fossa.  This was done all the way until  we encountered the dome.  This side of the gallbladder appeared  gangrenous to me.  There was no evidence of perforation of the  gallbladder, otherwise, or spillage of stones.  The gallbladder was then  placed in an EndoCatch bag.  I inspected the gallbladder bed and the  clips on the cystic duct and cystic artery and branches respectively.  These were hemostatic.  There was some oozing and I placed a small  amount of Surgicel in the gallbladder bed for this.  Hemostasis,  otherwise, was excellent.  Irrigation was used and suctioned out.  I  extracted the gallbladder from the umbilicus in an EndoCatch bag and  passed it off the field.   We then inspected the abdominal cavity and found no injury to bowel or  bladder or other solid organ.  We then extracted our instruments,  allowed the CO2 to escape and closed the port site under direct vision,  prior to doing this, with the pursestring suture of 0 Vicryl that was  already placed.  A 4-0 Monocryl was used to close all skin incisions.  Dermabond was applied as a dressing.  All final counts of sponge, needle  and instruments were found to be correct at this portion of the case.  The patient was then awakened and taken to recovery in satisfactory  condition.      Thomas A. Cornett, M.D.  Electronically Signed     TAC/MEDQ  D:  01/25/2007  T:  01/25/2007  Job:  [id number]

## 2010-06-14 NOTE — Discharge Summary (Signed)
NAME:  TALIS, IWAN NO.:  [id number]   MEDICAL RECORD NO.:  [id number]          PATIENT TYPE:  INP   LOCATION:  1317                         FACILITY:  [facility]   PHYSICIAN:  Clovis Pu. Cornett, M.D.DATE OF BIRTH:  29-Oct-1960   DATE OF ADMISSION:  01/25/2007  DATE OF DISCHARGE:  01/29/2007                               DISCHARGE SUMMARY   ADMITTING DIAGNOSIS:  Acute cholecystitis with gangrenous gallbladder  postop.   DISCHARGE DIAGNOSIS:  Acute cholecystitis with gangrenous gallbladder  postop.   PROCEDURES PERFORMED:  Laparoscopic cholecystectomy with cholangiogram.   BRIEF HISTORY:  The patient is a 50 year old male seen on January 25, 2007 with acute cholecystitis.  He was taken to the operating room for  laparoscopic cholecystectomy.   POSTOP COURSE:  His postop course was complicated by fever and elevated  white count.  This slowly came down on __________  with IV Cipro.  He  had some constipation issues; this resolved by discharge.  Once his  fever was gone and his white count normalized, his diet was advanced.  He was ambulating well.  His pain was controlled with p.o. analgesics.  He was discharged home on January 29, 2007 in improved condition.   DISCHARGE INSTRUCTIONS:  He will follow up in 2-3 weeks.  He will  refrain from lifting and driving for 1-2 weeks.  He will be given a  script for Vicodin ES for pain, 1-2 tablets q.4 p.r.n. pain, Cipro 500  mg p.o. b.i.d. for 7 more days, and MiraLax for constipation issues  which he has.  He will shower and resume a diet as tolerated.   CONDITION AT DISCHARGE:  Improved.      Thomas A. Cornett, M.D.  Electronically Signed     TAC/MEDQ  D:  01/29/2007  T:  01/29/2007  Job:  [id number]

## 2010-06-17 NOTE — Discharge Summary (Signed)
NAME:  Cody Nolan, Cody Nolan                        ACCOUNT NO.:  [id number]   MEDICAL RECORD NO.:  [id number]                   PATIENT TYPE:  INP   LOCATION:  0345                                 FACILITY:  [facility]   PHYSICIAN:  Isla Pence, M.D.             DATE OF BIRTH:  10-May-1960   DATE OF ADMISSION:  05/02/2003  DATE OF DISCHARGE:  05/03/2003                                 DISCHARGE SUMMARY   DISCHARGE DIAGNOSES:  1. Chest pain, atypical in nature and some suggestion of typical.  Ruled out     for myocardial infarction by serial CPK's.  2. Question of sleep apnea.  3. Question of hypothyroidism.  4. History of insomnia.  5. History of gastroesophageal reflux disease.  6. Newly diagnosed hypertension based on his blood pressure readings here     and also EKG showing left ventricular hypertrophy pattern with left     anterior descending.  7. History of left rotator cuff tear versus injury that was being followed     by ortho, but the patient has been lost to followup to them secondary to     cost issues.   DISCHARGE MEDICATIONS:  1. Norvasc 5 mg p.o. daily.  2. Metoprolol or Toprol XL 50 mg p.o. daily.  3. Trazodone 25 mg p.o. q.h.s. p.r.n. sleep.  4. Vicodin 5/500 one to two tabs p.o. q.4-6h. p.r.n. for left shoulder pain.  5. Axid 150 mg p.o. b.i.d. for reflux.  6. Ecotrin 325 mg p.o. daily.  The patient will only be given about 30 tablets on his Vicodin, the rest is  to be obtained by whoever becomes his primary care physician or if he goes  to see an orthopaedic doctor.   ACTIVITY:  As tolerated.   DIET:  Low fat, low salt.   FOLLOWUP:  1. The patient will be having an outpatient Cardiolite to be precepted by     [facility].  I spoke to Dr. Orvan Falconer regarding this patient     since he was on call for unassigned cardiology.  2. The patient will need a primary care physician, and he can either get     this through [facility] or the patient has been  given [facility] Internal     Medicine's main number if he chooses to use [place] as his primary care     physicians.   HOSPITAL COURSE:  This 50 year old African-American gentleman with no  previous past medical history until further questioning was admitted with  severe chest pain while walking.  He described the pain as sharp in nature,  but it was associated with a little bit of shortness of breath, but no  diaphoresis.  Denied nausea or vomiting with the pain.  The patient in  addition, says that it also felt a little later like an elephant sitting on  his chest.  He mostly points to his left lateral wall chest rather  than  substernal.  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  Of significance also, is the fact that he does have  gastroesophageal reflux disease for which he used to take Axid that was  controlling his symptoms, but he is no longer on Axid secondary to cost  issues.  The patient denied any orthopnea, PND, or lower extremity edema.  He does also report some left shoulder rotator cuff problems since an injury  about three or four months ago.  The cardiac risk factors:  He has never  been told of hypertension, but he has gained weight of about 65 pounds over  the past year.  No known hyperlipidemia.  He is, however, a smoker.  In the  initial history, family history of heart disease actually ended up being  negative since it was actually a sister who died at the age of 72 from a  congenital heart disease.  There is no family history otherwise of coronary  artery disease.  There is a history of stroke in the family.  The patient is  not a diabetic.  The patient was brought in.  His initial CPK was negative.  His EKG showed LVH, normal sinus rhythm, with LVH and LAD, but no otherwise  acute MI findings.  All of his pre-cardiac enzymes have been negative.  In  fact,  the maximum was only 186, which was the first CPK.  All of his  troponins have been less than 0.01.  I spoke to Dr. Elsie Lincoln from [facility]  [facility]iology who was on call for unassigned cardiology about getting a  Cardiolite stress test on this gentleman for further risk stratification,  and he recommended the Cardiolite as an outpatient done at [facility] to be  precepted by [facility].   The patient during his stay also gave the history of a possible problem with  obstructive sleep disorder.  He described hypersomnolence, snoring, and  apneic spells that his wife describes he does get.  These symptoms have  actually been going on for the past four or five years.  In addition, the  patient has also noted about a 65 pound weight gain over the past one year.  The patient has been advised that this will need to be worked up with a  sleep study, however, he will need to get a primary care physician to  initiate some of these things.  The patient understands this and states he  is very reliable.   He was diagnosed with hypertension here by Dr. ______________based on his  blood pressure that he has had here and also based on the EKG findings that  were consistent with LVH.  He was started on Toprol XL and Norvasc.  The  patient will be given prescriptions for these.   He also described problems with the weight gain as mentioned earlier, and he  notes that there is an extensive family history of hypothyroidism.  Therefore, a TSH level was also ordered and it is pending at the time of  this dictation.   The patient also describes insomnia which I told him it could be related to  if he has sleep apnea.  Therefore, at this time I am just giving him a  prescription for trazodone.   The patient also describes a long-standing history of gastroesophageal  reflux disease for which  he used to take Axid that was controlling his symptoms.  He asked if I could prescribe him this Axid again.   Therefore,  the patient will be sent out with this.   He notes that he has had problems with his left rotator cuff, and he was  supposed to have gone back to seeing orthopaedics, however, due to financial  reasons he has not done so, but does note that Vicodin by Dr.  ______________had put him on here was helping him.  I will go ahead and give  him 30 tablets of these Vicodin, but the rest will have to come through  whoever becomes his primary care physician, and I have explained this to the  patient.  The patient is very reliable and is agreeable with this plan.   As part of his risk stratification for his chest complaints, the patient did  have fasting lipid panel done and this is also pending at the time of  discharge and this dictation.  Whoever becomes his primary care physician  will need to follow up on all of these labs that are still pending which is  primarily the TSH and the fasting lipid panel.  His initial BMP at the time  of admission showed a sodium of 137, potassium of  3.6, chloride of 104, CO2 of 28, glucose of 101.  This was done late at  night.  BUN and creatinine were 7 and 0.8.  LFTs were normal.  His white  count was essentially normal with a total white count of 7.8, hemoglobin of  14.1, and hematocrit of 42.1, platelet count normal at 320,000.  His  differential was normal.  His chest x-ray on admission showed shallow  inflation without evidence for acute pulmonary abnormalities.   The patient is being discharged to home in stable condition.  He and his  wife are comfortable with that approach.  In fact, he was relieved to find  out that he did not have to stay any further.  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  The patient will be given the information on his Cardiolite  stress test date.  I have also asked the case manger to come and see him  prior to discharge to see if any offer of help for medications can be given,  and also for the case  manager to give him information on [facility].                                               Isla Pence, M.D.    RRV/MEDQ  D:  05/03/2003  T:  05/04/2003  Job:  [id number]   cc:   Madaline Savage, M.D.  [address], [address]  [place]  [place] [address]  Fax: [phone]

## 2010-06-17 NOTE — Discharge Summary (Signed)
NAME:  Cody Nolan, Cody Nolan                        ACCOUNT NO.:  [id number]   MEDICAL RECORD NO.:  [id number]                   PATIENT TYPE:  INP   LOCATION:  0345                                 FACILITY:  [facility]   PHYSICIAN:  Isla Pence, M.D.             DATE OF BIRTH:  12/05/1960   DATE OF ADMISSION:  05/02/2003  DATE OF DISCHARGE:  05/03/2003                                 DISCHARGE SUMMARY   DISCHARGE DIAGNOSES:  1. Atypical chest pain with some typical nature to it.  The patient ruled     out for myocardial infarction by serial CPK's.  2. Hypertension diagnosed this admission based on blood pressure, but also     EKG findings suggestive of left ventricular hypertrophy with left     anterior descending.  3. History of left rotator cuff tear or injury who was being followed by     ortho, but the patient has been lost to followup to them secondary to him     losing insurance.  4. History of gastroesophageal reflux.  5. Insomnia.  6. Question of hypothyroidism.  7. Question of sleep apnea.  8. 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   DISCHARGE MEDICATIONS:  1. Norvasc 5 mg p.o. daily.  2. Toprol XL 50 mg p.o. daily.  3. Trazodone 25 mg p.o. q.h.s.  4. Vicodin 5/500 one to two tablets p.o. q.4-6h. p.r.n. for his shoulder     pain.  5. Axid 150 mg p.o. b.i.d.  6. Aspirin/Ecotrin 325 mg p.o. daily.   ACTIVITY:  As tolerated.   DIET:  Low fat, low salt.   FOLLOWUP:  1. He is to have an outpatient Cardiolite at [facility] that will be precepted by     [facility].  I have spoken to Dr. Orvan Falconer who was on call     for unassigned cardiology today.  2. The patient will also need a primary care physician.  Case manager will     be seeing him regarding information on [facility] and to help him with     some medications that we can give him today.  3. In addition, the patient will be given the number for [facility]     Medicine.  The main  number for him to try and see if he would want to see     one of our physicians.                                               Isla Pence, M.D.    RRV/MEDQ  D:  05/03/2003  T:  05/04/2003  Job:  [id number]

## 2010-10-18 ENCOUNTER — Emergency Department (HOSPITAL_COMMUNITY)
Admission: EM | Admit: 2010-10-18 | Discharge: 2010-10-18 | Disposition: A | Discharge status: Home / Self Care | Payer: Self-pay | Attending: Emergency Medicine | Admitting: Emergency Medicine

## 2010-10-18 DIAGNOSIS — M549 Dorsalgia, unspecified: Secondary | ICD-10-CM | POA: Insufficient documentation

## 2010-10-18 DIAGNOSIS — R300 Dysuria: Secondary | ICD-10-CM | POA: Insufficient documentation

## 2010-10-18 DIAGNOSIS — Z794 Long term (current) use of insulin: Secondary | ICD-10-CM | POA: Insufficient documentation

## 2010-10-18 DIAGNOSIS — E119 Type 2 diabetes mellitus without complications: Secondary | ICD-10-CM | POA: Insufficient documentation

## 2010-10-18 DIAGNOSIS — I10 Essential (primary) hypertension: Secondary | ICD-10-CM | POA: Insufficient documentation

## 2010-10-18 DIAGNOSIS — N390 Urinary tract infection, site not specified: Secondary | ICD-10-CM | POA: Insufficient documentation

## 2010-10-18 LAB — URINALYSIS, ROUTINE W REFLEX MICROSCOPIC
Bilirubin Urine: NEGATIVE
Protein, ur: NEGATIVE mg/dL
Urobilinogen, UA: 1 mg/dL (ref 0.0–1.0)

## 2010-10-18 LAB — URINE MICROSCOPIC-ADD ON

## 2010-10-18 LAB — GLUCOSE, CAPILLARY: Glucose-Capillary: 111 mg/dL — ABNORMAL HIGH (ref 70–99)

## 2010-10-19 LAB — URINE CULTURE
Colony Count: NO GROWTH
Culture  Setup Time: 201209181431
Culture: NO GROWTH

## 2010-11-04 LAB — CBC
HCT: 35.1 — ABNORMAL LOW
HCT: 35.4 — ABNORMAL LOW
HCT: 36.7 — ABNORMAL LOW
HCT: 40.7
Hemoglobin: 11.8 — ABNORMAL LOW
Hemoglobin: 12 — ABNORMAL LOW
Hemoglobin: 12.3 — ABNORMAL LOW
Hemoglobin: 13.8
Hemoglobin: 14
MCHC: 33.6
MCHC: 33.6
MCHC: 34
MCV: 86.7
MCV: 88.1
MCV: 88.4
Platelets: 214
Platelets: 219
RBC: 3.97 — ABNORMAL LOW
RBC: 4.67
RBC: 4.67
RDW: 13.8
RDW: 14
WBC: 10.9 — ABNORMAL HIGH
WBC: 15.6 — ABNORMAL HIGH
WBC: 9.3

## 2010-11-04 LAB — COMPREHENSIVE METABOLIC PANEL
ALT: 20
ALT: 25
AST: 18
Albumin: 3 — ABNORMAL LOW
Alkaline Phosphatase: 59
BUN: 3 — ABNORMAL LOW
BUN: 4 — ABNORMAL LOW
CO2: 24
CO2: 30
CO2: 32
Calcium: 8 — ABNORMAL LOW
Calcium: 8.5
Calcium: 9.2
Chloride: 102
Chloride: 106
Creatinine, Ser: 0.83
Creatinine, Ser: 0.9
Creatinine, Ser: 0.95
GFR calc Af Amer: >60
GFR calc non Af Amer: >60
GFR calc non Af Amer: >60
GFR calc non Af Amer: >60
Glucose, Bld: 115 — ABNORMAL HIGH
Glucose, Bld: 118 — ABNORMAL HIGH
Glucose, Bld: 123 — ABNORMAL HIGH
Glucose, Bld: 135 — ABNORMAL HIGH
Potassium: 3.4 — ABNORMAL LOW
Potassium: 3.6
Sodium: 139
Sodium: 140
Total Bilirubin: 0.7
Total Bilirubin: 0.8
Total Bilirubin: 0.9
Total Protein: 6.8

## 2010-11-04 LAB — URINALYSIS, ROUTINE W REFLEX MICROSCOPIC
Ketones, ur: NEGATIVE
Leukocytes, UA: NEGATIVE
Nitrite: NEGATIVE
Protein, ur: 30 — AB
Urobilinogen, UA: 1

## 2010-11-04 LAB — PHOSPHORUS: Phosphorus: 3.6

## 2010-11-04 LAB — LIPASE, BLOOD: Lipase: 23

## 2010-11-04 LAB — DIFFERENTIAL
Basophils Absolute: 0.1
Eosinophils Absolute: 0.4
Lymphs Abs: 3.3
Neutrophils Relative %: 53

## 2010-11-04 LAB — MAGNESIUM: Magnesium: 2.1

## 2010-11-04 LAB — POCT CARDIAC MARKERS
Operator id: 4708
Troponin i, poc: <0.05

## 2010-11-09 LAB — URINE MICROSCOPIC-ADD ON

## 2010-11-09 LAB — URINALYSIS, ROUTINE W REFLEX MICROSCOPIC
Bilirubin Urine: NEGATIVE
Glucose, UA: NEGATIVE
Ketones, ur: NEGATIVE
Leukocytes, UA: NEGATIVE
Nitrite: NEGATIVE
Protein, ur: NEGATIVE
Specific Gravity, Urine: 1.03
Urobilinogen, UA: 1
pH: 6.5

## 2011-03-07 ENCOUNTER — Other Ambulatory Visit: Payer: Self-pay

## 2011-03-07 ENCOUNTER — Emergency Department (HOSPITAL_COMMUNITY): Payer: Medicaid Other

## 2011-03-07 ENCOUNTER — Encounter (HOSPITAL_COMMUNITY): Payer: Self-pay | Admitting: *Deleted

## 2011-03-07 ENCOUNTER — Emergency Department (HOSPITAL_COMMUNITY)
Admission: EM | Admit: 2011-03-07 | Discharge: 2011-03-07 | Disposition: A | Discharge status: Home / Self Care | Payer: Medicaid Other | Attending: Emergency Medicine | Admitting: Emergency Medicine

## 2011-03-07 DIAGNOSIS — R05 Cough: Secondary | ICD-10-CM | POA: Insufficient documentation

## 2011-03-07 DIAGNOSIS — E119 Type 2 diabetes mellitus without complications: Secondary | ICD-10-CM | POA: Insufficient documentation

## 2011-03-07 DIAGNOSIS — Z794 Long term (current) use of insulin: Secondary | ICD-10-CM | POA: Insufficient documentation

## 2011-03-07 DIAGNOSIS — R5381 Other malaise: Secondary | ICD-10-CM | POA: Insufficient documentation

## 2011-03-07 DIAGNOSIS — IMO0001 Reserved for inherently not codable concepts without codable children: Secondary | ICD-10-CM | POA: Insufficient documentation

## 2011-03-07 DIAGNOSIS — Z79899 Other long term (current) drug therapy: Secondary | ICD-10-CM | POA: Insufficient documentation

## 2011-03-07 DIAGNOSIS — R070 Pain in throat: Secondary | ICD-10-CM | POA: Insufficient documentation

## 2011-03-07 DIAGNOSIS — R059 Cough, unspecified: Secondary | ICD-10-CM | POA: Insufficient documentation

## 2011-03-07 DIAGNOSIS — R6889 Other general symptoms and signs: Secondary | ICD-10-CM | POA: Insufficient documentation

## 2011-03-07 DIAGNOSIS — J3489 Other specified disorders of nose and nasal sinuses: Secondary | ICD-10-CM | POA: Insufficient documentation

## 2011-03-07 DIAGNOSIS — R110 Nausea: Secondary | ICD-10-CM | POA: Insufficient documentation

## 2011-03-07 DIAGNOSIS — R197 Diarrhea, unspecified: Secondary | ICD-10-CM | POA: Insufficient documentation

## 2011-03-07 DIAGNOSIS — R509 Fever, unspecified: Secondary | ICD-10-CM | POA: Insufficient documentation

## 2011-03-07 DIAGNOSIS — I10 Essential (primary) hypertension: Secondary | ICD-10-CM | POA: Insufficient documentation

## 2011-03-07 HISTORY — DX: Essential (primary) hypertension: I10

## 2011-03-07 LAB — POCT I-STAT, CHEM 8
Calcium, Ion: 1.12 mmol/L (ref 1.12–1.32)
HCT: 42 % (ref 39.0–52.0)
TCO2: 28 mmol/L (ref 0–100)

## 2011-03-07 LAB — GLUCOSE, CAPILLARY: Glucose-Capillary: 170 mg/dL — ABNORMAL HIGH (ref 70–99)

## 2011-03-07 MED ORDER — SODIUM CHLORIDE 0.9 % IV BOLUS (SEPSIS)
1000.0000 mL | Freq: Once | INTRAVENOUS | Status: AC
  Administered 2011-03-07: 1000 mL via INTRAVENOUS

## 2011-03-07 MED ORDER — ONDANSETRON HCL 4 MG/2ML IJ SOLN: 4.0000 mg | Freq: Once | INTRAMUSCULAR | Status: DC

## 2011-03-07 MED ORDER — IBUPROFEN 800 MG PO TABS: 800.0000 mg | ORAL_TABLET | Freq: Three times a day (TID) | ORAL | Status: DC

## 2011-03-07 MED ORDER — ONDANSETRON 8 MG PO TBDP: 8.0000 mg | ORAL_TABLET | Freq: Three times a day (TID) | ORAL | Status: AC | PRN

## 2011-03-07 MED ORDER — ALBUTEROL SULFATE HFA 108 (90 BASE) MCG/ACT IN AERS
2.0000 | INHALATION_SPRAY | Freq: Once | RESPIRATORY_TRACT | Status: AC
  Administered 2011-03-07: 2 via RESPIRATORY_TRACT
  Filled 2011-03-07: qty 6.7

## 2011-03-07 NOTE — ED Notes (Signed)
Patient is resting comfortably. 

## 2011-03-07 NOTE — ED Notes (Signed)
Oral temp retaken 99.6---C/o generalized achiness, sore throat, head congestion, cough for the past 3-4 days.  States he normally has tachycardia with HR around 110 but, has been unable to take any of his meds for the past 3-4 days--Now has developed diarrhea--numerous times since yesterday

## 2011-03-07 NOTE — ED Notes (Signed)
Patient denies pain and is resting comfortably.  

## 2011-03-07 NOTE — ED Provider Notes (Signed)
History     CSN: [id number]  Arrival date & time 03/07/11  0530   First MD Initiated Contact with Patient 03/07/11 0557     7:05 AM HPI Patient reports 5 days ago began having influenza-like symptoms. Reports symptoms include nasal congestion, sore throat, cough, fever or, myalgias. States he was waiting for symptoms to improve and suddenly began having a associated nausea, and severe diarrhea. Reports he is a diabetic and he is concerned that he is lost to manage fluids. States he has multiple episodes daily and has lost count. Reports feeling increased fatigue. Denies abdominal pain, chest pain, shortness of breath, urinary symptoms, back pain. Patient is a 51 y.o. male presenting with flu symptoms. The history is provided by the patient.  Influenza This is a new problem. The current episode started in the past 7 days. The problem occurs constantly. The problem has been unchanged. Associated symptoms include chills, congestion, coughing, fatigue, a fever, myalgias, nausea and a sore throat. Pertinent negatives include no abdominal pain, chest pain, headaches, joint swelling, numbness, rash, urinary symptoms, vomiting or weakness. He has tried nothing for the symptoms.    Past Medical History  Diagnosis Date  . Diabetes mellitus   . Hypertension     Past Surgical History  Procedure Date  . Cholecystectomy   . Knee 3 knee surgeries    History reviewed. No pertinent family history.  History  Substance Use Topics  . Smoking status: Current Everyday Smoker -- 0.7 packs/day for 10 years    Types: Cigarettes  . Smokeless tobacco: Never Used  . Alcohol Use: Yes     2 cans of beer a month      Review of Systems  Constitutional: Positive for fever, chills and fatigue.  HENT: Positive for congestion, sore throat and rhinorrhea. Negative for sinus pressure.   Respiratory: Positive for cough. Negative for shortness of breath.   Cardiovascular: Negative for chest pain.    Gastrointestinal: Positive for nausea and diarrhea. Negative for vomiting, abdominal pain, constipation, blood in stool and rectal pain.  Genitourinary: Negative for dysuria, hematuria, flank pain, discharge, penile pain and testicular pain.  Musculoskeletal: Positive for myalgias. Negative for back pain and joint swelling.  Skin: Negative for rash.  Neurological: Negative for dizziness, weakness, numbness and headaches.  All other systems reviewed and are negative.    Allergies  Review of patient's allergies indicates no known allergies.  Home Medications   Current Outpatient Rx  Name Route Sig Dispense Refill  . GLIMEPIRIDE 4 MG PO TABS Oral Take 4 mg by mouth daily.      . INSULIN GLARGINE 100 UNIT/ML [place] SOLN Subcutaneous Inject 30 Units into the skin every morning.      Marland Kitchen METFORMIN HCL 1000 MG PO TABS Oral Take 1,000 mg by mouth 2 (two) times daily with meals.      Marland Kitchen METOPROLOL TARTRATE 25 MG PO TABS Oral Take 25 mg by mouth 2 (two) times daily.      Marland Kitchen AMITRIPTYLINE HCL 50 MG PO TABS Oral Take 100 mg by mouth at bedtime as needed.      . ASPIRIN 81 MG PO CHEW Oral Chew 81 mg by mouth daily.      Marland Kitchen RAMIPRIL 2.5 MG PO CAPS Oral Take 2.5 mg by mouth daily.      Marland Kitchen SIMVASTATIN 40 MG PO TABS Oral Take 40 mg by mouth at bedtime.        BP 104/74  Pulse 103  Temp(Src)  98 F (36.7 C) (Oral)  Resp 18  Ht 5' 7.5" (1.715 m)  Wt 250 lb (113.399 kg)  BMI 38.58 kg/m2  SpO2 94%  Physical Exam  Vitals reviewed. Constitutional: He is oriented to person, place, and time. He appears well-developed and well-nourished. No distress.  HENT:  Head: Normocephalic and atraumatic.  Right Ear: External ear normal.  Left Ear: External ear normal.  Nose: Nose normal.  Mouth/Throat: Oropharynx is clear and moist. No oropharyngeal exudate.  Eyes: Conjunctivae are normal. Pupils are equal, round, and reactive to light.  Neck: Normal range of motion. Neck supple.  Cardiovascular: Normal rate,  regular rhythm and normal heart sounds.  Exam reveals no gallop and no friction rub.   No murmur heard. Pulmonary/Chest: Effort normal and breath sounds normal. No respiratory distress. He has no wheezes. He has no rales. He exhibits no tenderness.  Abdominal: Soft. Bowel sounds are normal. He exhibits no distension and no mass. There is no tenderness. There is no rebound and no guarding.  Lymphadenopathy:    He has no cervical adenopathy.  Neurological: He is alert and oriented to person, place, and time.  Skin: Skin is warm and dry. No rash noted. No erythema. No pallor.  Psychiatric: He has a normal mood and affect. His behavior is normal.    ED Course  Procedures  Results for orders placed during the hospital encounter of 03/07/11  GLUCOSE, CAPILLARY      Component Value Range   Glucose-Capillary 170 (*) 70 - 99 (mg/dL)  POCT I-STAT, CHEM 8      Component Value Range   Sodium 144  135 - 145 (mEq/L)   Potassium 3.3 (*) 3.5 - 5.1 (mEq/L)   Chloride 106  96 - 112 (mEq/L)   BUN 11  6 - 23 (mg/dL)   Creatinine, Ser 0.98  0.50 - 1.35 (mg/dL)   Glucose, Bld 119 (*) 70 - 99 (mg/dL)   Calcium, Ion 1.47  8.29 - 1.32 (mmol/L)   TCO2 28  0 - 100 (mmol/L)   Hemoglobin 14.3  13.0 - 17.0 (g/dL)   HCT 56.2  13.0 - 86.5 (%)   Dg Chest 2 View  03/07/2011  *RADIOLOGY REPORT*  Clinical Data: Cough and congestion  CHEST - 2 VIEW  Comparison: Chest radiograph 07/23/2009  Findings: Normal mediastinum and heart silhouette.  There are bronchitic markings centrally which are slightly increased.  No focal consolidation.  No pneumothorax. No acute osseous abnormality.  IMPRESSION: Increased bronchitic markings centrally could represent bronchiolitis or bronchitis.  Original Report Authenticated By: Genevive Bi, M.D.     MDM   Patient reports significant improvement after fluids. Will discharge home with antiemetics and advised anti-inflammatory medication to help relieve body aches. Advised followup  with primary care physician for worsening symptoms. BUN and creatinine within normal limits no significant signs on i-STAT for dehydration chest x-ray shows mild bronchitic changes will discharge home with albuterol inhaler.     Thomasene Lot, PA-C 03/07/11 7846   ED ECG REPORT   Date: 03/07/2011  EKG Time: 9:19 AM  Rate: 98  Rhythm: Sinus rhythm  Axis: nml  Intervals:none  ST&T Change: T-wave inversions  Narrative Interpretation: T-wave inversions are a new finding since 10/11/2008 EKG. Patient is not currently complaining of chest pain or shortness of breath. Symptoms are consistent with a viral infection.             Thomasene Lot, PA-C 03/07/11 [phone]

## 2011-03-07 NOTE — ED Notes (Signed)
Pt states that he started developing a fever this past Friday, sore throat, generalized body ache. States that he developed diarrhea yesterday in addition to the flu like symptoms. Pt took OTC meds with no relief. Pt has type 2 diabetes and has not taken his Glucophage because of the diarrhea.

## 2011-03-07 NOTE — ED Provider Notes (Signed)
Medical screening examination/treatment/procedure(s) were performed by non-physician practitioner and as supervising physician I was immediately available for consultation/collaboration.   Lyanne Co, MD 03/07/11 1011

## 2011-03-07 NOTE — ED Notes (Signed)
Vital signs stable. 

## 2011-03-16 ENCOUNTER — Emergency Department (HOSPITAL_COMMUNITY): Payer: Medicaid Other

## 2011-03-16 ENCOUNTER — Encounter (HOSPITAL_COMMUNITY): Payer: Self-pay | Admitting: *Deleted

## 2011-03-16 ENCOUNTER — Inpatient Hospital Stay (HOSPITAL_COMMUNITY)
Admission: EM | Admit: 2011-03-16 | Discharge: 2011-03-19 | DRG: 195 | Disposition: A | Discharge status: Home / Self Care | Payer: Medicaid Other | Attending: Internal Medicine | Admitting: Internal Medicine

## 2011-03-16 ENCOUNTER — Other Ambulatory Visit: Payer: Self-pay

## 2011-03-16 DIAGNOSIS — I10 Essential (primary) hypertension: Secondary | ICD-10-CM

## 2011-03-16 DIAGNOSIS — M25569 Pain in unspecified knee: Secondary | ICD-10-CM

## 2011-03-16 DIAGNOSIS — G4730 Sleep apnea, unspecified: Secondary | ICD-10-CM | POA: Diagnosis present

## 2011-03-16 DIAGNOSIS — R079 Chest pain, unspecified: Secondary | ICD-10-CM | POA: Diagnosis present

## 2011-03-16 DIAGNOSIS — Z91199 Patient's noncompliance with other medical treatment and regimen due to unspecified reason: Secondary | ICD-10-CM

## 2011-03-16 DIAGNOSIS — F17200 Nicotine dependence, unspecified, uncomplicated: Secondary | ICD-10-CM | POA: Diagnosis present

## 2011-03-16 DIAGNOSIS — R9431 Abnormal electrocardiogram [ECG] [EKG]: Secondary | ICD-10-CM

## 2011-03-16 DIAGNOSIS — R062 Wheezing: Secondary | ICD-10-CM | POA: Diagnosis present

## 2011-03-16 DIAGNOSIS — M79609 Pain in unspecified limb: Secondary | ICD-10-CM

## 2011-03-16 DIAGNOSIS — R0602 Shortness of breath: Secondary | ICD-10-CM | POA: Diagnosis present

## 2011-03-16 DIAGNOSIS — R739 Hyperglycemia, unspecified: Secondary | ICD-10-CM

## 2011-03-16 DIAGNOSIS — Z9119 Patient's noncompliance with other medical treatment and regimen: Secondary | ICD-10-CM

## 2011-03-16 DIAGNOSIS — M549 Dorsalgia, unspecified: Secondary | ICD-10-CM

## 2011-03-16 DIAGNOSIS — E785 Hyperlipidemia, unspecified: Secondary | ICD-10-CM

## 2011-03-16 DIAGNOSIS — R5381 Other malaise: Secondary | ICD-10-CM | POA: Diagnosis present

## 2011-03-16 DIAGNOSIS — J189 Pneumonia, unspecified organism: Principal | ICD-10-CM

## 2011-03-16 DIAGNOSIS — E119 Type 2 diabetes mellitus without complications: Secondary | ICD-10-CM

## 2011-03-16 HISTORY — DX: Sleep apnea, unspecified: G47.30

## 2011-03-16 HISTORY — DX: Tachycardia, unspecified: R00.0

## 2011-03-16 LAB — CBC
MCH: 29.1 pg (ref 26.0–34.0)
MCV: 88.4 fL (ref 78.0–100.0)
Platelets: 303 10*3/uL (ref 150–400)
RDW: 14.4 % (ref 11.5–15.5)

## 2011-03-16 LAB — COMPREHENSIVE METABOLIC PANEL
ALT: 18 U/L (ref 0–53)
Calcium: 9.8 mg/dL (ref 8.4–10.5)
Creatinine, Ser: 0.79 mg/dL (ref 0.50–1.35)
GFR calc Af Amer: >90 mL/min (ref >90)
Glucose, Bld: 174 mg/dL — ABNORMAL HIGH (ref 70–99)
Sodium: 139 mEq/L (ref 135–145)
Total Protein: 8.7 g/dL — ABNORMAL HIGH (ref 6.0–8.3)

## 2011-03-16 LAB — URINALYSIS, ROUTINE W REFLEX MICROSCOPIC
Bilirubin Urine: NEGATIVE
Hgb urine dipstick: NEGATIVE
Nitrite: NEGATIVE
Protein, ur: 30 mg/dL — AB
Specific Gravity, Urine: 1.038 — ABNORMAL HIGH (ref 1.005–1.030)
Urobilinogen, UA: 0.2 mg/dL (ref 0.0–1.0)

## 2011-03-16 LAB — DIFFERENTIAL
Eosinophils Absolute: 0.3 10*3/uL (ref 0.0–0.7)
Eosinophils Relative: 2 % (ref 0–5)
Lymphs Abs: 3.3 10*3/uL (ref 0.7–4.0)
Monocytes Absolute: 1.3 10*3/uL — ABNORMAL HIGH (ref 0.1–1.0)

## 2011-03-16 LAB — TROPONIN I: Troponin I: <0.3 ng/mL (ref <0.30)

## 2011-03-16 LAB — CK TOTAL AND CKMB (NOT AT ARMC)
CK, MB: 2.5 ng/mL (ref 0.3–4.0)
Relative Index: 2.3 (ref 0.0–2.5)
Total CK: 110 U/L (ref 7–232)

## 2011-03-16 LAB — URINE MICROSCOPIC-ADD ON

## 2011-03-16 LAB — GLUCOSE, CAPILLARY: Glucose-Capillary: 207 mg/dL — ABNORMAL HIGH (ref 70–99)

## 2011-03-16 MED ORDER — INSULIN GLARGINE 100 UNIT/ML ~~LOC~~ SOLN
30.0000 [IU] | SUBCUTANEOUS | Status: DC
  Administered 2011-03-17 – 2011-03-19 (×3): 30 [IU] via SUBCUTANEOUS
  Filled 2011-03-16: qty 3

## 2011-03-16 MED ORDER — METOPROLOL TARTRATE 25 MG PO TABS
25.0000 mg | ORAL_TABLET | Freq: Two times a day (BID) | ORAL | Status: DC
  Administered 2011-03-17 – 2011-03-18 (×5): 25 mg via ORAL
  Filled 2011-03-16 (×7): qty 1

## 2011-03-16 MED ORDER — METFORMIN HCL 500 MG PO TABS
500.0000 mg | ORAL_TABLET | Freq: Two times a day (BID) | ORAL | Status: DC
  Administered 2011-03-17 – 2011-03-18 (×3): 500 mg via ORAL
  Filled 2011-03-16 (×6): qty 1

## 2011-03-16 MED ORDER — SODIUM CHLORIDE 0.9 % IV BOLUS (SEPSIS)
1000.0000 mL | Freq: Once | INTRAVENOUS | Status: AC
  Administered 2011-03-16: 1000 mL via INTRAVENOUS

## 2011-03-16 MED ORDER — DEXTROSE 5 % IV SOLN
1.0000 g | Freq: Once | INTRAVENOUS | Status: AC
  Administered 2011-03-16: 1 g via INTRAVENOUS
  Filled 2011-03-16: qty 10

## 2011-03-16 MED ORDER — GLIMEPIRIDE 4 MG PO TABS
4.0000 mg | ORAL_TABLET | Freq: Every day | ORAL | Status: DC
  Administered 2011-03-17 – 2011-03-18 (×2): 4 mg via ORAL
  Filled 2011-03-16 (×3): qty 1

## 2011-03-16 MED ORDER — DEXTROSE 5 % IV SOLN
500.0000 mg | Freq: Once | INTRAVENOUS | Status: AC
  Administered 2011-03-16: 500 mg via INTRAVENOUS
  Filled 2011-03-16: qty 500

## 2011-03-16 NOTE — ED Provider Notes (Signed)
History     CSN: [id number]  Arrival date & time 03/16/11  1638   First MD Initiated Contact with Patient 03/16/11 1844      Chief Complaint  Patient presents with  . Weakness  . Fatigue    (Consider location/radiation/quality/duration/timing/severity/associated sxs/prior treatment) HPI Patient with history of stomach virus.  Felt better and went back to work Monday but very fatigued and tired.  Patient states that he keeps falling asleep at his desk at home. He has not been taking his medications during the past week. He has continued to have a cough. He denies any chest pain or shortness of breath. He is lightheaded when he stands up. Past Medical History  Diagnosis Date  . Diabetes mellitus   . Hypertension   . Tachycardia     Past Surgical History  Procedure Date  . Cholecystectomy   . Knee 3 knee surgeries    No family history on file.  History  Substance Use Topics  . Smoking status: Current Everyday Smoker -- 0.7 packs/day for 10 years    Types: Cigarettes  . Smokeless tobacco: Never Used  . Alcohol Use: Yes     2 cans of beer a month      Review of Systems  All other systems reviewed and are negative.    Allergies  Eggs or egg-derived products and Penicillins  Home Medications   Current Outpatient Rx  Name Route Sig Dispense Refill  . AMITRIPTYLINE HCL 50 MG PO TABS Oral Take 100 mg by mouth at bedtime as needed.      . ASPIRIN 81 MG PO CHEW Oral Chew 81 mg by mouth daily.      Marland Kitchen GLIMEPIRIDE 4 MG PO TABS Oral Take 4 mg by mouth daily.      . IBUPROFEN 800 MG PO TABS Oral Take 1 tablet (800 mg total) by mouth 3 (three) times daily. 21 tablet 0  . INSULIN GLARGINE 100 UNIT/ML [place] SOLN Subcutaneous Inject 30 Units into the skin every morning.      Marland Kitchen METFORMIN HCL 1000 MG PO TABS Oral Take 1,000 mg by mouth 2 (two) times daily with meals.      Marland Kitchen METOPROLOL TARTRATE 25 MG PO TABS Oral Take 25 mg by mouth 2 (two) times daily.      Marland Kitchen RAMIPRIL 2.5 MG PO  CAPS Oral Take 2.5 mg by mouth daily.      Marland Kitchen SIMVASTATIN 40 MG PO TABS Oral Take 40 mg by mouth at bedtime.        BP 124/74  Pulse 123  Temp(Src) 99.6 F (37.6 C) (Oral)  Resp 18  SpO2 98%  Physical Exam  Nursing note and vitals reviewed. Constitutional: He is oriented to person, place, and time. He appears well-developed and well-nourished.       Morbidly obese  HENT:  Head: Normocephalic and atraumatic.  Right Ear: External ear normal.  Left Ear: External ear normal.  Mouth/Throat: Oropharynx is clear and moist.  Eyes: Pupils are equal, round, and reactive to light.  Neck: Normal range of motion.  Cardiovascular: Tachycardia present.   Pulmonary/Chest: Effort normal and breath sounds normal.  Abdominal: Soft. Bowel sounds are normal.  Musculoskeletal: Normal range of motion.  Neurological: He is alert and oriented to person, place, and time.  Skin: Skin is warm and dry.  Psychiatric: He has a normal mood and affect.    ED Course  Procedures (including critical care time)  Labs Reviewed  GLUCOSE, CAPILLARY -  Abnormal; Notable for the following:    Glucose-Capillary 207 (*)    All other components within normal limits   No results found.   No diagnosis found. Results for orders placed during the hospital encounter of 03/16/11  GLUCOSE, CAPILLARY      Component Value Range   Glucose-Capillary 207 (*) 70 - 99 (mg/dL)   Comment 1 Notify RN     Comment 2 Documented in Chart    CBC      Component Value Range   WBC 15.7 (*) 4.0 - 10.5 (K/uL)   RBC 5.16  4.22 - 5.81 (MIL/uL)   Hemoglobin 15.0  13.0 - 17.0 (g/dL)   HCT 65.7  84.6 - 96.2 (%)   MCV 88.4  78.0 - 100.0 (fL)   MCH 29.1  26.0 - 34.0 (pg)   MCHC 32.9  30.0 - 36.0 (g/dL)   RDW 95.2  84.1 - 32.4 (%)   Platelets 303  150 - 400 (K/uL)  DIFFERENTIAL      Component Value Range   Neutrophils Relative 68  43 - 77 (%)   Neutro Abs 10.8 (*) 1.7 - 7.7 (K/uL)   Lymphocytes Relative 21  12 - 46 (%)   Lymphs Abs  3.3  0.7 - 4.0 (K/uL)   Monocytes Relative 9  3 - 12 (%)   Monocytes Absolute 1.3 (*) 0.1 - 1.0 (K/uL)   Eosinophils Relative 2  0 - 5 (%)   Eosinophils Absolute 0.3  0.0 - 0.7 (K/uL)   Basophils Relative 0  0 - 1 (%)   Basophils Absolute 0.1  0.0 - 0.1 (K/uL)  COMPREHENSIVE METABOLIC PANEL      Component Value Range   Sodium 139  135 - 145 (mEq/L)   Potassium 3.9  3.5 - 5.1 (mEq/L)   Chloride 99  96 - 112 (mEq/L)   CO2 28  19 - 32 (mEq/L)   Glucose, Bld 174 (*) 70 - 99 (mg/dL)   BUN 7  6 - 23 (mg/dL)   Creatinine, Ser 4.01  0.50 - 1.35 (mg/dL)   Calcium 9.8  8.4 - 02.7 (mg/dL)   Total Protein 8.7 (*) 6.0 - 8.3 (g/dL)   Albumin 3.4 (*) 3.5 - 5.2 (g/dL)   AST 19  0 - 37 (U/L)   ALT 18  0 - 53 (U/L)   Alkaline Phosphatase 74  39 - 117 (U/L)   Total Bilirubin 0.3  0.3 - 1.2 (mg/dL)   GFR calc non Af Amer >90  >90 (mL/min)   GFR calc Af Amer >90  >90 (mL/min)  CK TOTAL AND CKMB      Component Value Range   Total CK 110  7 - 232 (U/L)   CK, MB 2.5  0.3 - 4.0 (ng/mL)   Relative Index 2.3  0.0 - 2.5   TROPONIN I      Component Value Range   Troponin I <0.30  <0.30 (ng/mL)  URINALYSIS, ROUTINE W REFLEX MICROSCOPIC      Component Value Range   Color, Urine AMBER (*) YELLOW    APPearance CLEAR  CLEAR    Specific Gravity, Urine 1.038 (*) 1.005 - 1.030    pH 6.0  5.0 - 8.0    Glucose, UA NEGATIVE  NEGATIVE (mg/dL)   Hgb urine dipstick NEGATIVE  NEGATIVE    Bilirubin Urine NEGATIVE  NEGATIVE    Ketones, ur TRACE (*) NEGATIVE (mg/dL)   Protein, ur 30 (*) NEGATIVE (mg/dL)   Urobilinogen, UA 0.2  0.0 - 1.0 (mg/dL)   Nitrite NEGATIVE  NEGATIVE    Leukocytes, UA NEGATIVE  NEGATIVE   URINE MICROSCOPIC-ADD ON      Component Value Range   Squamous Epithelial / LPF RARE  RARE    WBC, UA 3-6  <3 (WBC/hpf)   RBC / HPF 0-2  <3 (RBC/hpf)   Bacteria, UA MANY (*) RARE    Urine-Other MUCOUS PRESENT       Date: 03/16/2011  Rate: 113  Rhythm: sinus tachycardia  QRS Axis: normal   Intervals: normal  ST/T Wave abnormalities: diffuse st depression and t wave inversion  Conduction Disutrbances:none  Narrative Interpretation:   Old EKG Reviewed: none available   MDM   Patient with community-acquired pneumonia. He is to Rocephin and Zithromax. He is tachycardic and has a leukocytosis. He does have diabetes with elevated blood sugar. He also has an abnormal EKG with diffuse ST depression and T-wave inversion. I discussed with Dr. Joneen Roach and he will be admitted to their service.       Hilario Quarry, MD 03/16/11 [phone]

## 2011-03-16 NOTE — H&P (Signed)
Cody Nolan is an 51 y.o. male.   Chief Complaint: Weakness , fatigue and productive cough  HPI: Last week 03/07/11 he came to [facility] room for flu like s/s v/s stomach virus unable to take po intake for 3-4 days. Reports  Fever, chills nasal conjestion and myalgias at that time treated and released. Sunday he became progreesively weaker still tried to go to work on Monday  employeer sent him  Home after that he has been in bed ever since. Today came to ED brought in by car by wife for increase fatigue weakness and genralized not feeling well. Patient reports having a  Productive cough with red/yellow sputum sore throat fever chills and myalgia .  Past Medical History  Diagnosis Date  . Diabetes mellitus   . Hypertension   . Tachycardia   . Sleep apnea     Past Surgical History  Procedure Date  . Cholecystectomy   . Knee 3 knee surgeries    History reviewed. No pertinent family history. Social History:  reports that he has been smoking Cigarettes.  He has a 7.5 pack-year smoking history. He has never used smokeless tobacco. He reports that he drinks alcohol. He reports that he does not use illicit drugs.  Allergies:  Allergies  Allergen Reactions  . Eggs Or Egg-Derived Products   . Penicillins     Unknown reaction-childhood  . Sulfa Antibiotics     Medications Prior to Admission  Medication Dose Route Frequency Provider Last Rate Last Dose  . azithromycin (ZITHROMAX) 500 mg in dextrose 5 % 250 mL IVPB  500 mg Intravenous Once Hilario Quarry, MD   500 mg at 03/16/11 2250  . cefTRIAXone (ROCEPHIN) 1 g in dextrose 5 % 50 mL IVPB  1 g Intravenous Once Hilario Quarry, MD   1 g at 03/16/11 2249  . glimepiride (AMARYL) tablet 4 mg  4 mg Oral Daily Hilario Quarry, MD      . insulin glargine (LANTUS) injection 30 Units  30 Units Subcutaneous [id number] Hilario Quarry, MD      . metFORMIN (GLUCOPHAGE) tablet 500 mg  500 mg Oral BID Hilario Quarry, MD      . metoprolol tartrate (LOPRESSOR)  tablet 25 mg  25 mg Oral BID Hilario Quarry, MD      . sodium chloride 0.9 % bolus 1,000 mL  1,000 mL Intravenous Once Hilario Quarry, MD   1,000 mL at 03/16/11 1935  . sodium chloride 0.9 % bolus 1,000 mL  1,000 mL Intravenous Once Hilario Quarry, MD   1,000 mL at 03/16/11 2240   Medications Prior to Admission  Medication Sig Dispense Refill  . glimepiride (AMARYL) 4 MG tablet Take 4 mg by mouth daily.       . insulin glargine (LANTUS SOLOSTAR) 100 UNIT/ML injection Inject 30 Units into the skin every morning.       . metoprolol (LOPRESSOR) 25 MG tablet Take 25 mg by mouth 2 (two) times daily.        . metFORMIN (GLUCOPHAGE) 1000 MG tablet Take 1,000 mg by mouth daily.         Results for orders placed during the hospital encounter of 03/16/11 (from the past 48 hour(s))  GLUCOSE, CAPILLARY     Status: Abnormal   Collection Time   03/16/11  6:30 PM      Component Value Range Comment   Glucose-Capillary 207 (*) 70 - 99 (mg/dL)    Comment 1  Notify RN      Comment 2 Documented in Chart     CBC     Status: Abnormal   Collection Time   03/16/11  7:55 PM      Component Value Range Comment   WBC 15.7 (*) 4.0 - 10.5 (K/uL)    RBC 5.16  4.22 - 5.81 (MIL/uL)    Hemoglobin 15.0  13.0 - 17.0 (g/dL)    HCT 40.9  81.1 - 91.4 (%)    MCV 88.4  78.0 - 100.0 (fL)    MCH 29.1  26.0 - 34.0 (pg)    MCHC 32.9  30.0 - 36.0 (g/dL)    RDW 78.2  95.6 - 21.3 (%)    Platelets 303  150 - 400 (K/uL)   DIFFERENTIAL     Status: Abnormal   Collection Time   03/16/11  7:55 PM      Component Value Range Comment   Neutrophils Relative 68  43 - 77 (%)    Neutro Abs 10.8 (*) 1.7 - 7.7 (K/uL)    Lymphocytes Relative 21  12 - 46 (%)    Lymphs Abs 3.3  0.7 - 4.0 (K/uL)    Monocytes Relative 9  3 - 12 (%)    Monocytes Absolute 1.3 (*) 0.1 - 1.0 (K/uL)    Eosinophils Relative 2  0 - 5 (%)    Eosinophils Absolute 0.3  0.0 - 0.7 (K/uL)    Basophils Relative 0  0 - 1 (%)    Basophils Absolute 0.1  0.0 - 0.1 (K/uL)     COMPREHENSIVE METABOLIC PANEL     Status: Abnormal   Collection Time   03/16/11  7:55 PM      Component Value Range Comment   Sodium 139  135 - 145 (mEq/L)    Potassium 3.9  3.5 - 5.1 (mEq/L)    Chloride 99  96 - 112 (mEq/L)    CO2 28  19 - 32 (mEq/L)    Glucose, Bld 174 (*) 70 - 99 (mg/dL)    BUN 7  6 - 23 (mg/dL)    Creatinine, Ser 0.86  0.50 - 1.35 (mg/dL)    Calcium 9.8  8.4 - 10.5 (mg/dL)    Total Protein 8.7 (*) 6.0 - 8.3 (g/dL)    Albumin 3.4 (*) 3.5 - 5.2 (g/dL)    AST 19  0 - 37 (U/L)    ALT 18  0 - 53 (U/L)    Alkaline Phosphatase 74  39 - 117 (U/L)    Total Bilirubin 0.3  0.3 - 1.2 (mg/dL)    GFR calc non Af Amer >90  >90 (mL/min)    GFR calc Af Amer >90  >90 (mL/min)   CK TOTAL AND CKMB     Status: Normal   Collection Time   03/16/11  7:55 PM      Component Value Range Comment   Total CK 110  7 - 232 (U/L)    CK, MB 2.5  0.3 - 4.0 (ng/mL)    Relative Index 2.3  0.0 - 2.5    TROPONIN I     Status: Normal   Collection Time   03/16/11  7:55 PM      Component Value Range Comment   Troponin I <0.30  <0.30 (ng/mL)   URINALYSIS, ROUTINE W REFLEX MICROSCOPIC     Status: Abnormal   Collection Time   03/16/11  9:32 PM      Component Value Range Comment  Color, Urine AMBER (*) YELLOW  BIOCHEMICALS MAY BE AFFECTED BY COLOR   APPearance CLEAR  CLEAR     Specific Gravity, Urine 1.038 (*) 1.005 - 1.030     pH 6.0  5.0 - 8.0     Glucose, UA NEGATIVE  NEGATIVE (mg/dL)    Hgb urine dipstick NEGATIVE  NEGATIVE     Bilirubin Urine NEGATIVE  NEGATIVE     Ketones, ur TRACE (*) NEGATIVE (mg/dL)    Protein, ur 30 (*) NEGATIVE (mg/dL)    Urobilinogen, UA 0.2  0.0 - 1.0 (mg/dL)    Nitrite NEGATIVE  NEGATIVE     Leukocytes, UA NEGATIVE  NEGATIVE    URINE MICROSCOPIC-ADD ON     Status: Abnormal   Collection Time   03/16/11  9:32 PM      Component Value Range Comment   Squamous Epithelial / LPF RARE  RARE     WBC, UA 3-6  <3 (WBC/hpf)    RBC / HPF 0-2  <3 (RBC/hpf)    Bacteria, UA  MANY (*) RARE     Urine-Other MUCOUS PRESENT      Dg Chest 2 View  03/16/2011  *RADIOLOGY REPORT*  Clinical Data: Cough, weakness, and shortness of breath.  History of hypertension.  CHEST - 2 VIEW  Comparison: 03/07/2011  Findings: Shallow inspiration.  Heart size and pulmonary vascularity are normal for technique. Linear atelectasis or consolidation in the left mid lung.  No blunting of costophrenic angles.  No pneumothorax. Degenerative changes in the thoracic spine.  IMPRESSION: Linear atelectasis or consolidation in the left mid lung. Pneumonia is not excluded.  Original Report Authenticated By: Marlon Pel, M.D.    Review of Systems  Constitutional: Positive for fever, chills and malaise/fatigue.  HENT: Positive for congestion. Negative for hearing loss.   Eyes: Negative.   Respiratory: Positive for cough, sputum production, shortness of breath and wheezing.   Cardiovascular: Negative.   Gastrointestinal: Negative.   Genitourinary: Negative.   Neurological: Positive for weakness. Negative for headaches.  Endo/Heme/Allergies: Negative.   Psychiatric/Behavioral: Negative.     Blood pressure 116/61, pulse 107, temperature 97.8 F (36.6 C), temperature source Oral, resp. rate 20, SpO2 100.00%. Physical Exam  Constitutional: He is oriented to person, place, and time. He appears well-developed and well-nourished.  HENT:  Head: Normocephalic.  Eyes: Pupils are equal, round, and reactive to light.  Neck: Normal range of motion. Neck supple.  Cardiovascular: Normal rate and regular rhythm.   GI: Soft.  Musculoskeletal: Normal range of motion.  Neurological: He is alert and oriented to person, place, and time. A cranial nerve deficit is present.  Skin: Skin is warm and dry.  Psychiatric: He has a normal mood and affect. His behavior is normal.     Assessment/Plan Community Acquired Pneumonia  IVF and ABT's  Sleep apnea non compliant unable to afford cpap  Hypertension non  compliant doesn't take medications consistently Diabetes -past due for eye exam takes medication as prescribed cont home dosage  Tobacco abuse 3/4 packs a day counseled on smoking cessation  Wheezing solumedrol and breathing treatments  Yvette Roark 03/16/2011, 11:09 PM

## 2011-03-16 NOTE — ED Notes (Signed)
Pt reports being seen for flu and stomach virus last week. Has been off meds (HTN and diabetes) since being sick. C/o extreme fatigue. Falling asleep at work, and sleeping most of the time at home. Pt denies any other c/o.

## 2011-03-17 ENCOUNTER — Encounter (HOSPITAL_COMMUNITY): Payer: Self-pay | Admitting: Primary Care

## 2011-03-17 LAB — GLUCOSE, CAPILLARY
Glucose-Capillary: 159 mg/dL — ABNORMAL HIGH (ref 70–99)
Glucose-Capillary: 218 mg/dL — ABNORMAL HIGH (ref 70–99)

## 2011-03-17 LAB — CREATININE, SERUM: GFR calc Af Amer: >90 mL/min (ref >90)

## 2011-03-17 LAB — CBC
HCT: 43.7 % (ref 39.0–52.0)
Hemoglobin: 14.6 g/dL (ref 13.0–17.0)
MCH: 29.7 pg (ref 26.0–34.0)
MCHC: 33.4 g/dL (ref 30.0–36.0)
RDW: 14.2 % (ref 11.5–15.5)

## 2011-03-17 LAB — CARDIAC PANEL(CRET KIN+CKTOT+MB+TROPI)
CK, MB: 2.4 ng/mL (ref 0.3–4.0)
Relative Index: 2.4 (ref 0.0–2.5)
Total CK: 106 U/L (ref 7–232)

## 2011-03-17 LAB — BASIC METABOLIC PANEL
Calcium: 9.1 mg/dL (ref 8.4–10.5)
Creatinine, Ser: 0.64 mg/dL (ref 0.50–1.35)
GFR calc non Af Amer: >90 mL/min (ref >90)
Glucose, Bld: 214 mg/dL — ABNORMAL HIGH (ref 70–99)
Sodium: 137 mEq/L (ref 135–145)

## 2011-03-17 LAB — HEMOGLOBIN A1C
Hgb A1c MFr Bld: 7.4 % — ABNORMAL HIGH (ref <5.7)
Mean Plasma Glucose: 166 mg/dL — ABNORMAL HIGH (ref <117)

## 2011-03-17 MED ORDER — BENZONATATE 100 MG PO CAPS
100.0000 mg | ORAL_CAPSULE | Freq: Three times a day (TID) | ORAL | Status: DC | PRN
Start: 2011-03-17 — End: 2011-03-19
  Filled 2011-03-17: qty 1

## 2011-03-17 MED ORDER — IPRATROPIUM BROMIDE 0.02 % IN SOLN
0.5000 mg | RESPIRATORY_TRACT | Status: DC | PRN
  Administered 2011-03-17 – 2011-03-18 (×4): 0.5 mg via RESPIRATORY_TRACT
  Filled 2011-03-17 (×4): qty 2.5

## 2011-03-17 MED ORDER — INSULIN ASPART 100 UNIT/ML ~~LOC~~ SOLN
0.0000 [IU] | Freq: Every day | SUBCUTANEOUS | Status: DC
  Administered 2011-03-17: 2 [IU] via SUBCUTANEOUS

## 2011-03-17 MED ORDER — ONDANSETRON HCL 4 MG/2ML IJ SOLN
4.0000 mg | Freq: Four times a day (QID) | INTRAMUSCULAR | Status: DC | PRN
  Administered 2011-03-18: 4 mg via INTRAVENOUS
  Filled 2011-03-17: qty 2

## 2011-03-17 MED ORDER — ALPRAZOLAM 1 MG PO TABS
1.0000 mg | ORAL_TABLET | Freq: Every evening | ORAL | Status: DC | PRN
  Administered 2011-03-17 – 2011-03-18 (×2): 1 mg via ORAL
  Filled 2011-03-17 (×2): qty 1

## 2011-03-17 MED ORDER — ONDANSETRON HCL 4 MG PO TABS
4.0000 mg | ORAL_TABLET | Freq: Four times a day (QID) | ORAL | Status: DC | PRN
  Filled 2011-03-17: qty 1

## 2011-03-17 MED ORDER — DEXTROSE 5 % IV SOLN
500.0000 mg | INTRAVENOUS | Status: DC
  Administered 2011-03-18 – 2011-03-19 (×2): 500 mg via INTRAVENOUS
  Filled 2011-03-17 (×5): qty 500

## 2011-03-17 MED ORDER — SODIUM CHLORIDE 0.9 % IJ SOLN: 3.0000 mL | INTRAMUSCULAR | Status: DC | PRN

## 2011-03-17 MED ORDER — HYDROCODONE-ACETAMINOPHEN 10-325 MG PO TABS
1.0000 | ORAL_TABLET | Freq: Once | ORAL | Status: AC
  Administered 2011-03-17: 1 via ORAL
  Filled 2011-03-17: qty 1

## 2011-03-17 MED ORDER — ALBUTEROL SULFATE (5 MG/ML) 0.5% IN NEBU
2.5000 mg | INHALATION_SOLUTION | RESPIRATORY_TRACT | Status: DC | PRN
  Administered 2011-03-17 – 2011-03-18 (×4): 2.5 mg via RESPIRATORY_TRACT
  Filled 2011-03-17 (×4): qty 0.5

## 2011-03-17 MED ORDER — CYCLOBENZAPRINE HCL 10 MG PO TABS
10.0000 mg | ORAL_TABLET | Freq: Once | ORAL | Status: AC
  Administered 2011-03-17: 10 mg via ORAL
  Filled 2011-03-17: qty 1

## 2011-03-17 MED ORDER — ENOXAPARIN SODIUM 40 MG/0.4ML ~~LOC~~ SOLN
40.0000 mg | SUBCUTANEOUS | Status: DC
  Administered 2011-03-18: 40 mg via SUBCUTANEOUS
  Filled 2011-03-17 (×3): qty 0.4

## 2011-03-17 MED ORDER — NICOTINE 14 MG/24HR TD PT24
14.0000 mg | MEDICATED_PATCH | Freq: Every day | TRANSDERMAL | Status: DC
  Administered 2011-03-17 – 2011-03-19 (×3): 14 mg via TRANSDERMAL
  Filled 2011-03-17 (×3): qty 1

## 2011-03-17 MED ORDER — DOCUSATE SODIUM 100 MG PO CAPS
100.0000 mg | ORAL_CAPSULE | Freq: Two times a day (BID) | ORAL | Status: DC
  Administered 2011-03-18 (×2): 100 mg via ORAL
  Filled 2011-03-17 (×6): qty 1

## 2011-03-17 MED ORDER — METHYLPREDNISOLONE SODIUM SUCC 40 MG IJ SOLR
40.0000 mg | Freq: Once | INTRAMUSCULAR | Status: AC
  Administered 2011-03-17: 40 mg via INTRAVENOUS
  Filled 2011-03-17: qty 1

## 2011-03-17 MED ORDER — DEXTROSE 5 % IV SOLN
1.0000 g | INTRAVENOUS | Status: DC
  Administered 2011-03-17 – 2011-03-19 (×3): 1 g via INTRAVENOUS
  Filled 2011-03-17 (×4): qty 10

## 2011-03-17 MED ORDER — INSULIN ASPART 100 UNIT/ML ~~LOC~~ SOLN
0.0000 [IU] | Freq: Three times a day (TID) | SUBCUTANEOUS | Status: DC
  Administered 2011-03-17: 4 [IU] via SUBCUTANEOUS
  Administered 2011-03-17: 7 [IU] via SUBCUTANEOUS
  Administered 2011-03-17 – 2011-03-18 (×2): 4 [IU] via SUBCUTANEOUS
  Administered 2011-03-18: 3 [IU] via SUBCUTANEOUS
  Administered 2011-03-18: 4 [IU] via SUBCUTANEOUS
  Administered 2011-03-19: 3 [IU] via SUBCUTANEOUS
  Filled 2011-03-17: qty 3
  Filled 2011-03-17 (×2): qty 1

## 2011-03-17 MED ORDER — SODIUM CHLORIDE 0.9 % IV SOLN
INTRAVENOUS | Status: DC
  Administered 2011-03-17 – 2011-03-18 (×3): via INTRAVENOUS

## 2011-03-17 NOTE — Progress Notes (Signed)
ED CM noted pcp as [facility] and self pay listed for pt CM spoke with pt who confirms [facility] is not the pcp States he has seen Dr Britt Bottom blount but it has been months. Discussed other self pay local providers.  Pt agrees to a referral to [facility] to screen for possible [facility] or other community pcp and resources.  Referral completed

## 2011-03-17 NOTE — ED Notes (Signed)
Pt eating. Will get labs when finished.

## 2011-03-17 NOTE — ED Notes (Signed)
Pt refused cardiac panel despite explanation of purpose.  Pt also requests pain medication for back pain.  Will call MD.

## 2011-03-17 NOTE — H&P (Addendum)
Patient seen and evaluated. I agree with nurse practitioner Gwinda Passe physical examination and assessment and plan.   Patient vitals are stable General: Alert and oriented times three, obese, no acute distress  Eyes: PERRLA, pink conjunctiva, no scleral icterus  ENT: Moist oral mucosa, neck supple, no thyromegaly  Lungs: clear to ascultation, mild wheeze, no crackles, no use of accessory muscles  Cardiovascular: regular rate and rhythm, no regurgitation, no gallops, no murmurs. No carotid bruits, no JVD  Abdomen: soft, positive BS, nontender, non-distended, no organomegaly, not an acute abdomen  GU: not examined  Neuro: CN II - XII grossly intact, sensation intact  Musculoskeletal: strength 5/5 all extremities, no clubbing, cyanosis or edema  Skin: no rash, no subcutaneous crepitation, no decubitus  Psych: appropriate patient  Patient has pneumonia. Sputum cultures and blood cultures are ordered. Patient on antibiotics. Nicotine patch ordered. DuoNeb as needed and an oxygen. CPAP ordered. Patient on ADA diet and sliding scale insulin.

## 2011-03-17 NOTE — Progress Notes (Signed)
Pt seen and examined  Reviewed h and p Will r/o ACS  Cardiac enzymes RN reports dark urine  Will check CK Anticipate DC tomorrow

## 2011-03-17 NOTE — ED Notes (Signed)
Patient vitals better. Patient states he feels like he can go on.

## 2011-03-17 NOTE — ED Notes (Signed)
Informed md about the antibiotics being given a second time last night and decreased urine output.   No additional orders.  Just to follow up the am labs

## 2011-03-18 ENCOUNTER — Inpatient Hospital Stay (HOSPITAL_COMMUNITY): Payer: Medicaid Other

## 2011-03-18 LAB — BASIC METABOLIC PANEL
BUN: 9 mg/dL (ref 6–23)
Calcium: 9.2 mg/dL (ref 8.4–10.5)
GFR calc Af Amer: >90 mL/min (ref >90)
GFR calc non Af Amer: >90 mL/min (ref >90)
Glucose, Bld: 161 mg/dL — ABNORMAL HIGH (ref 70–99)
Potassium: 3.5 mEq/L (ref 3.5–5.1)
Sodium: 139 mEq/L (ref 135–145)

## 2011-03-18 LAB — CBC
Hemoglobin: 13.8 g/dL (ref 13.0–17.0)
MCH: 28.8 pg (ref 26.0–34.0)
MCHC: 32.4 g/dL (ref 30.0–36.0)
Platelets: 295 10*3/uL (ref 150–400)
RBC: 4.79 MIL/uL (ref 4.22–5.81)

## 2011-03-18 LAB — GLUCOSE, CAPILLARY
Glucose-Capillary: 149 mg/dL — ABNORMAL HIGH (ref 70–99)
Glucose-Capillary: 172 mg/dL — ABNORMAL HIGH (ref 70–99)
Glucose-Capillary: 196 mg/dL — ABNORMAL HIGH (ref 70–99)

## 2011-03-18 MED ORDER — IOHEXOL 300 MG/ML  SOLN
100.0000 mL | Freq: Once | INTRAMUSCULAR | Status: AC | PRN
  Administered 2011-03-18: 100 mL via INTRAVENOUS

## 2011-03-18 MED ORDER — CYCLOBENZAPRINE HCL 10 MG PO TABS
10.0000 mg | ORAL_TABLET | Freq: Three times a day (TID) | ORAL | Status: DC | PRN
  Administered 2011-03-18: 10 mg via ORAL
  Filled 2011-03-18 (×2): qty 1

## 2011-03-18 MED ORDER — ALBUTEROL SULFATE (5 MG/ML) 0.5% IN NEBU: 2.5000 mg | INHALATION_SOLUTION | Freq: Four times a day (QID) | RESPIRATORY_TRACT | Status: DC | PRN

## 2011-03-18 MED ORDER — IPRATROPIUM BROMIDE 0.02 % IN SOLN: 0.5000 mg | Freq: Four times a day (QID) | RESPIRATORY_TRACT | Status: DC | PRN

## 2011-03-18 MED ORDER — IPRATROPIUM-ALBUTEROL 18-103 MCG/ACT IN AERO
2.0000 | INHALATION_SPRAY | Freq: Three times a day (TID) | RESPIRATORY_TRACT | Status: DC
  Filled 2011-03-18 (×2): qty 14.7

## 2011-03-18 MED ORDER — HYDROCODONE-ACETAMINOPHEN 10-325 MG PO TABS
1.0000 | ORAL_TABLET | Freq: Three times a day (TID) | ORAL | Status: DC | PRN
  Administered 2011-03-18: 1 via ORAL
  Filled 2011-03-18: qty 1

## 2011-03-18 NOTE — Progress Notes (Signed)
Patient refuses to use CPAP tonight

## 2011-03-18 NOTE — Progress Notes (Signed)
Subjective: Complaining of persistent shortness of breath and chest pain  Objective: Vital signs in last 24 hours: Filed Vitals:   03/17/11 2229 03/18/11 0645 03/18/11 0752 03/18/11 1127  BP:  120/78    Pulse:  98    Temp:  98.6 F (37 C)    TempSrc:  Oral    Resp:  20    Height:    5\' 7"  (1.702 m)  Weight:  121.4 kg (267 lb 10.2 oz)  120.203 kg (265 lb)  SpO2: 98% 98% 99%     Intake/Output Summary (Last 24 hours) at 03/18/11 1415 Last data filed at 03/18/11 0600  Gross per 24 hour  Intake 2148.75 ml  Output      0 ml  Net 2148.75 ml    Weight change:    General: Alert and oriented times three, obese, no acute distress  Eyes: PERRLA, pink conjunctiva, no scleral icterus  ENT: Moist oral mucosa, neck supple, no thyromegaly  Lungs: clear to ascultation, mild wheeze, no crackles, no use of accessory muscles  Cardiovascular: regular rate and rhythm, no regurgitation, no gallops, no murmurs. No carotid bruits, no JVD  Abdomen: soft, positive BS, nontender, non-distended, no organomegaly, not an acute abdomen  GU: not examined  Neuro: CN II - XII grossly intact, sensation intact  Musculoskeletal: strength 5/5 all extremities, no clubbing, cyanosis or edema  Skin: no rash, no subcutaneous crepitation, no decubitus  Psych: appropriate patient  Lab Results: Results for orders placed during the hospital encounter of 03/16/11 (from the past 24 hour(s))  CBC     Status: Abnormal   Collection Time   03/17/11  3:12 PM      Component Value Range   WBC 19.9 (*) 4.0 - 10.5 (K/uL)   RBC 4.92  4.22 - 5.81 (MIL/uL)   Hemoglobin 14.6  13.0 - 17.0 (g/dL)   HCT 45.4  09.8 - 11.9 (%)   MCV 88.8  78.0 - 100.0 (fL)   MCH 29.7  26.0 - 34.0 (pg)   MCHC 33.4  30.0 - 36.0 (g/dL)   RDW 14.7  82.9 - 56.2 (%)   Platelets 324  150 - 400 (K/uL)  CREATININE, SERUM     Status: Normal   Collection Time   03/17/11  3:12 PM      Component Value Range   Creatinine, Ser 0.70  0.50 - 1.35 (mg/dL)   GFR calc non Af Amer >90  >90 (mL/min)   GFR calc Af Amer >90  >90 (mL/min)  GLUCOSE, CAPILLARY     Status: Abnormal   Collection Time   03/17/11  6:19 PM      Component Value Range   Glucose-Capillary 178 (*) 70 - 99 (mg/dL)   Comment 1 Notify RN    GLUCOSE, CAPILLARY     Status: Abnormal   Collection Time   03/17/11  9:43 PM      Component Value Range   Glucose-Capillary 218 (*) 70 - 99 (mg/dL)  GLUCOSE, CAPILLARY     Status: Abnormal   Collection Time   03/18/11  7:16 AM      Component Value Range   Glucose-Capillary 149 (*) 70 - 99 (mg/dL)  GLUCOSE, CAPILLARY     Status: Abnormal   Collection Time   03/18/11 11:09 AM      Component Value Range   Glucose-Capillary 172 (*) 70 - 99 (mg/dL)   Comment 1 Notify RN       Micro: Recent Results (from the past  240 hour(s))  CULTURE, BLOOD (ROUTINE X 2)     Status: Normal (Preliminary result)   Collection Time   03/17/11  1:00 AM      Component Value Range Status Comment   Specimen Description BLOOD LEFT HAND   Final    Special Requests BOTTLES DRAWN AEROBIC AND ANAEROBIC 3CC   Final    Culture  Setup Time [id number]   Final    Culture     Final    Value:        BLOOD CULTURE RECEIVED NO GROWTH TO DATE CULTURE WILL BE HELD FOR 5 DAYS BEFORE ISSUING A FINAL NEGATIVE REPORT   Report Status PENDING   Incomplete   CULTURE, BLOOD (ROUTINE X 2)     Status: Normal (Preliminary result)   Collection Time   03/17/11  1:03 AM      Component Value Range Status Comment   Specimen Description BLOOD LEFT ANTECUBITAL   Final    Special Requests BOTTLES DRAWN AEROBIC AND ANAEROBIC 3CC   Final    Culture  Setup Time [id number]   Final    Culture     Final    Value:        BLOOD CULTURE RECEIVED NO GROWTH TO DATE CULTURE WILL BE HELD FOR 5 DAYS BEFORE ISSUING A FINAL NEGATIVE REPORT   Report Status PENDING   Incomplete     Studies/Results: Dg Chest 2 View  03/16/2011  *RADIOLOGY REPORT*  Clinical Data: Cough, weakness, and shortness of  breath.  History of hypertension.  CHEST - 2 VIEW  Comparison: 03/07/2011  Findings: Shallow inspiration.  Heart size and pulmonary vascularity are normal for technique. Linear atelectasis or consolidation in the left mid lung.  No blunting of costophrenic angles.  No pneumothorax. Degenerative changes in the thoracic spine.  IMPRESSION: Linear atelectasis or consolidation in the left mid lung. Pneumonia is not excluded.  Original Report Authenticated By: Marlon Pel, M.D.    Medications:  Scheduled Meds:   . azithromycin  500 mg Intravenous Q24H  . cefTRIAXone (ROCEPHIN)  IV  1 g Intravenous Q24H  . cyclobenzaprine  10 mg Oral Once  . docusate sodium  100 mg Oral BID  . enoxaparin  40 mg Subcutaneous Q24H  . glimepiride  4 mg Oral Daily  . HYDROcodone-acetaminophen  1 tablet Oral Once  . insulin aspart  0-20 Units Subcutaneous TID WC  . insulin aspart  0-5 Units Subcutaneous QHS  . insulin glargine  30 Units Subcutaneous BH-q7a  . metFORMIN  500 mg Oral BID  . metoprolol tartrate  25 mg Oral BID  . nicotine  14 mg Transdermal Daily   Continuous Infusions:   . sodium chloride 75 mL/hr at 03/18/11 1255   PRN Meds:.albuterol, ALPRAZolam, benzonatate, ipratropium, ondansetron (ZOFRAN) IV, ondansetron, sodium chloride   Assessment: Active Problems: #1 DM A1c 7.4 currently stable on Lantus and sliding scale insulin DC metformin  #2 HYPERLIPIDEMIA stable  #3 Essential hypertension, benign stable #4 CAP (community acquired pneumonia) continue antibiotics because the patient is complaining of chest pain we'll do a CT scan to rule out a PE       LOS: 2 days   [facility] 03/18/2011, 2:15 PM

## 2011-03-19 MED ORDER — IPRATROPIUM BROMIDE 0.02 % IN SOLN: 0.5000 mg | Freq: Four times a day (QID) | RESPIRATORY_TRACT | Status: DC | PRN

## 2011-03-19 MED ORDER — METOPROLOL TARTRATE 12.5 MG HALF TABLET
12.5000 mg | ORAL_TABLET | Freq: Two times a day (BID) | ORAL | Status: DC
  Administered 2011-03-19: 12.5 mg via ORAL
  Filled 2011-03-19 (×2): qty 1

## 2011-03-19 MED ORDER — ALBUTEROL SULFATE (5 MG/ML) 0.5% IN NEBU: 2.5000 mg | INHALATION_SOLUTION | Freq: Four times a day (QID) | RESPIRATORY_TRACT | Status: DC | PRN

## 2011-03-19 MED ORDER — ALPRAZOLAM 1 MG PO TABS: 1.0000 mg | ORAL_TABLET | Freq: Every evening | ORAL | Status: DC | PRN

## 2011-03-19 MED ORDER — HYDROCODONE-ACETAMINOPHEN 2.5-500 MG PO TABS: 1.0000 | ORAL_TABLET | Freq: Two times a day (BID) | ORAL | Status: DC | PRN

## 2011-03-19 MED ORDER — METOPROLOL TARTRATE 25 MG PO TABS: 25.0000 mg | ORAL_TABLET | ORAL | Status: DC

## 2011-03-19 MED ORDER — MOXIFLOXACIN HCL 400 MG PO TABS: 400.0000 mg | ORAL_TABLET | Freq: Every day | ORAL | Status: AC

## 2011-03-19 NOTE — Progress Notes (Addendum)
Pt reported to have 2.18 pause. . Pt asymptomatic, no c/o distress vitals stable. MD notified.  1610 pt has 4 pauses in row : 2.20,3.09,2.26 and 2.47. Pt reports he has sleep apnea but has refused CPAP while here. Vitals stable, conversation appropriate and attending notified. Pt also reports tests done at [facility], echo and stress w/i year, with no abnormalities.

## 2011-03-19 NOTE — Discharge Summary (Signed)
Physician Discharge Summary  Cody Nolan MRN: [id number] DOB/AGE: [birth year] 50 y.o.  PCP: Lloyd Huger, MD, MD   Admit date: 03/16/2011 Discharge date: 03/19/2011  Discharge Diagnoses:  Asymptomatic pauses on telemetry:  DM  HYPERLIPIDEMIA  Essential hypertension, benign  CAP (community acquired pneumonia)   Medication List  As of 03/19/2011 10:44 AM   STOP taking these medications         ibuprofen 200 MG tablet         TAKE these medications         albuterol (5 MG/ML) 0.5% nebulizer solution   Commonly known as: PROVENTIL   Take 0.5 mLs (2.5 mg total) by nebulization every 6 (six) hours as needed for wheezing or shortness of breath.      ALPRAZolam 1 MG tablet, prescription provided with 5 tablets    Commonly known as: XANAX   Take 1 tablet (1 mg total) by mouth at bedtime as needed for sleep.      cyclobenzaprine 10 MG tablet   Commonly known as: FLEXERIL   Take 10 mg by mouth 3 (three) times daily as needed.      glimepiride 4 MG tablet   Commonly known as: AMARYL   Take 4 mg by mouth daily.      HYDROcodone-acetaminophen 2.5-500 MG per tablet, prescription provided for 10 tablets    Commonly known as: VICODIN   Take 1 tablet by mouth 2 (two) times daily as needed for pain.      ipratropium 0.02 % nebulizer solution   Commonly known as: ATROVENT   Take 2.5 mLs (0.5 mg total) by nebulization every 6 (six) hours as needed.      LANTUS SOLOSTAR 100 UNIT/ML injection   Generic drug: insulin glargine   Inject 30 Units into the skin every morning.      metFORMIN 1000 MG tablet   Commonly known as: GLUCOPHAGE   Take 1,000 mg by mouth daily.      metoprolol tartrate 25 MG tablet   Commonly known as: LOPRESSOR   Take 1 tablet (25 mg total) by mouth 1 day or 1 dose.      moxifloxacin 400 MG tablet   Commonly known as: AVELOX   Take 1 tablet (400 mg total) by mouth daily.            Discharge Condition: Stable Disposition: Home or Self  Care   Consults: None Significant Diagnostic Studies: Dg Chest 2 View  03/16/2011  *RADIOLOGY REPORT*  Clinical Data: Cough, weakness, and shortness of breath.  History of hypertension.  CHEST - 2 VIEW  Comparison: 03/07/2011  Findings: Shallow inspiration.  Heart size and pulmonary vascularity are normal for technique. Linear atelectasis or consolidation in the left mid lung.  No blunting of costophrenic angles.  No pneumothorax. Degenerative changes in the thoracic spine.  IMPRESSION: Linear atelectasis or consolidation in the left mid lung. Pneumonia is not excluded.  Original Report Authenticated By: Marlon Pel, M.D.   Dg Chest 2 View  03/07/2011  *RADIOLOGY REPORT*  Clinical Data: Cough and congestion  CHEST - 2 VIEW  Comparison: Chest radiograph 07/23/2009  Findings: Normal mediastinum and heart silhouette.  There are bronchitic markings centrally which are slightly increased.  No focal consolidation.  No pneumothorax. No acute osseous abnormality.  IMPRESSION: Increased bronchitic markings centrally could represent bronchiolitis or bronchitis.  Original Report Authenticated By: Genevive Bi, M.D.   Ct Angio Chest W/cm &/or Wo Cm  03/18/2011  *RADIOLOGY REPORT*  Clinical Data: Shortness of breath, chest pain  CT ANGIOGRAPHY CHEST  Technique:  Multidetector CT imaging of the chest using the standard protocol during bolus administration of intravenous contrast. Multiplanar reconstructed images including MIPs were obtained and reviewed to evaluate the vascular anatomy.  Contrast: OMNIPAQUE IOHEXOL 300 MG/ML IV SOLN  Comparison: None.  Findings: There is good contrast opacification of the pulmonary artery branches.  No discrete filling defect to suggest acute PE.Good contrast opacification of the thoracic aorta with no evidence of dissection, aneurysm, or stenosis. There is classic 3- vessel brachiocephalic arch anatomy.  No pleural or pericardial effusion.  No hilar or mediastinal  adenopathy.  Dependent atelectasis or subpleural scarring posteriorly in both lower lobes. No confluent airspace infiltrate.  Visualized upper abdomen unremarkable.  Minimal spondylitic changes in the lower thoracic spine.  IMPRESSION:  1.  Negative for acute PE or thoracic aortic dissection.  Original Report Authenticated By: Osa Craver, M.D.     Microbiology: Recent Results (from the past 240 hour(s))  CULTURE, BLOOD (ROUTINE X 2)     Status: Normal (Preliminary result)   Collection Time   03/17/11  1:00 AM      Component Value Range Status Comment   Specimen Description BLOOD LEFT HAND   Final    Special Requests BOTTLES DRAWN AEROBIC AND ANAEROBIC 3CC   Final    Culture  Setup Time [id number]   Final    Culture     Final    Value:        BLOOD CULTURE RECEIVED NO GROWTH TO DATE CULTURE WILL BE HELD FOR 5 DAYS BEFORE ISSUING A FINAL NEGATIVE REPORT   Report Status PENDING   Incomplete   CULTURE, BLOOD (ROUTINE X 2)     Status: Normal (Preliminary result)   Collection Time   03/17/11  1:03 AM      Component Value Range Status Comment   Specimen Description BLOOD LEFT ANTECUBITAL   Final    Special Requests BOTTLES DRAWN AEROBIC AND ANAEROBIC 3CC   Final    Culture  Setup Time [id number]   Final    Culture     Final    Value:        BLOOD CULTURE RECEIVED NO GROWTH TO DATE CULTURE WILL BE HELD FOR 5 DAYS BEFORE ISSUING A FINAL NEGATIVE REPORT   Report Status PENDING   Incomplete      Labs: Results for orders placed during the hospital encounter of 03/16/11 (from the past 48 hour(s))  CARDIAC PANEL(CRET KIN+CKTOT+MB+TROPI)     Status: Normal   Collection Time   03/17/11 12:40 PM      Component Value Range Comment   Total CK 106  7 - 232 (U/L)    CK, MB 2.4  0.3 - 4.0 (ng/mL)    Troponin I <0.30  <0.30 (ng/mL)    Relative Index 2.3  0.0 - 2.5    GLUCOSE, CAPILLARY     Status: Abnormal   Collection Time   03/17/11  1:21 PM      Component Value Range Comment    Glucose-Capillary 159 (*) 70 - 99 (mg/dL)   CBC     Status: Abnormal   Collection Time   03/17/11  3:12 PM      Component Value Range Comment   WBC 19.9 (*) 4.0 - 10.5 (K/uL)    RBC 4.92  4.22 - 5.81 (MIL/uL)    Hemoglobin 14.6  13.0 - 17.0 (g/dL)  HCT 43.7  39.0 - 52.0 (%)    MCV 88.8  78.0 - 100.0 (fL)    MCH 29.7  26.0 - 34.0 (pg)    MCHC 33.4  30.0 - 36.0 (g/dL)    RDW 13.0  86.5 - 78.4 (%)    Platelets 324  150 - 400 (K/uL)   CREATININE, SERUM     Status: Normal   Collection Time   03/17/11  3:12 PM      Component Value Range Comment   Creatinine, Ser 0.70  0.50 - 1.35 (mg/dL)    GFR calc non Af Amer >90  >90 (mL/min)    GFR calc Af Amer >90  >90 (mL/min)   GLUCOSE, CAPILLARY     Status: Abnormal   Collection Time   03/17/11  6:19 PM      Component Value Range Comment   Glucose-Capillary 178 (*) 70 - 99 (mg/dL)    Comment 1 Notify RN     GLUCOSE, CAPILLARY     Status: Abnormal   Collection Time   03/17/11  9:43 PM      Component Value Range Comment   Glucose-Capillary 218 (*) 70 - 99 (mg/dL)   GLUCOSE, CAPILLARY     Status: Abnormal   Collection Time   03/18/11  7:16 AM      Component Value Range Comment   Glucose-Capillary 149 (*) 70 - 99 (mg/dL)   GLUCOSE, CAPILLARY     Status: Abnormal   Collection Time   03/18/11 11:09 AM      Component Value Range Comment   Glucose-Capillary 172 (*) 70 - 99 (mg/dL)    Comment 1 Notify RN     BASIC METABOLIC PANEL     Status: Abnormal   Collection Time   03/18/11  4:40 PM      Component Value Range Comment   Sodium 139  135 - 145 (mEq/L)    Potassium 3.5  3.5 - 5.1 (mEq/L)    Chloride 99  96 - 112 (mEq/L)    CO2 33 (*) 19 - 32 (mEq/L)    Glucose, Bld 161 (*) 70 - 99 (mg/dL)    BUN 9  6 - 23 (mg/dL)    Creatinine, Ser 6.96  0.50 - 1.35 (mg/dL)    Calcium 9.2  8.4 - 10.5 (mg/dL)    GFR calc non Af Amer >90  >90 (mL/min)    GFR calc Af Amer >90  >90 (mL/min)   CBC     Status: Abnormal   Collection Time   03/18/11  4:40 PM       Component Value Range Comment   WBC 13.2 (*) 4.0 - 10.5 (K/uL)    RBC 4.79  4.22 - 5.81 (MIL/uL)    Hemoglobin 13.8  13.0 - 17.0 (g/dL)    HCT 29.5  28.4 - 13.2 (%)    MCV 88.9  78.0 - 100.0 (fL)    MCH 28.8  26.0 - 34.0 (pg)    MCHC 32.4  30.0 - 36.0 (g/dL)    RDW 44.0  10.2 - 72.5 (%)    Platelets 295  150 - 400 (K/uL)   GLUCOSE, CAPILLARY     Status: Abnormal   Collection Time   03/18/11  4:52 PM      Component Value Range Comment   Glucose-Capillary 196 (*) 70 - 99 (mg/dL)    Comment 1 Notify RN     GLUCOSE, CAPILLARY     Status: Abnormal   Collection  Time   03/18/11  9:17 PM      Component Value Range Comment   Glucose-Capillary 188 (*) 70 - 99 (mg/dL)    Comment 1 Notify RN     GLUCOSE, CAPILLARY     Status: Abnormal   Collection Time   03/19/11  5:50 AM      Component Value Range Comment   Glucose-Capillary 152 (*) 70 - 99 (mg/dL)    Comment 1 Notify RN     GLUCOSE, CAPILLARY     Status: Abnormal   Collection Time   03/19/11  7:45 AM      Component Value Range Comment   Glucose-Capillary 139 (*) 70 - 99 (mg/dL)      HPI Last week 02/05/08 he came to [facility] room for flu like s/s v/s stomach virus unable to take po intake for 3-4 days. Reports Fever, chills nasal conjestion and myalgias at that time treated and released. Sunday he became progreesively weaker still tried to go to work on Monday employeer sent him Home after that he has been in bed ever since. He presented to ED , brought in by car by wife for increase fatigue weakness and genralized not feeling well. Patient reports having a Productive cough with red/yellow sputum sore throat fever chills and myalgia . He was found to have a community-acquired pneumonia    HOSPITAL COURSE:  #1 community-acquired pneumonia he was treated with azithromycin and Rocephin. His oxygen requirements was stable at 2 L BNP was also treated with nebulizer treatments. He has requested a nebulizer for home. He has been provided with a  prescription for Atrovent and albuterol nebulizer for home. He also has a history of sleep apnea and has been noncompliant with CPAP. His cardiac enzymes were cycled and were found to be negative. He also had a CT angina that was negative for PE.  #2 diabetes patient's diabetes was controlled during this hospitalization  #3 nicotine dependency was counseled about smoking cessation  #4 asymptomatic pauses on telemetry with the longest one being 3.09 seconds. The patient has had a stress echo done at [facility] in heart and vascular needed I have recommended him to followup with his cardiologist to see if he needs a  Holter monitor so we have decreased his beta blocker dose. I have also counseled him about narcotics and Xanax contribution to his pauses, in addition to his sleep apnea will  Discharge Exam:  Blood pressure 132/84, pulse 85, temperature 98.7 F (37.1 C), temperature source Oral, resp. rate 20, height 5\' 7"  (1.702 m), weight 120.203 kg (265 lb), SpO2 93.00%.  HENT:  Head: Normocephalic.  Eyes: Pupils are equal, round, and reactive to light.  Neck: Normal range of motion. Neck supple.  Cardiovascular: Normal rate and regular rhythm.  GI: Soft.  Musculoskeletal: Normal range of motion.  Neurological: He is alert and oriented to person, place, and time. A cranial nerve deficit is present.  Skin: Skin is warm and dry.  Psychiatric: He has a normal mood and affect. His behavior is normal.     Discharge Orders    Future Orders Please Complete By Expires   For home use only DME oxygen      Monitor O2 SATs      Scheduling Instructions:   AMBULATE ON RA      Follow-up Information    Follow up with Lloyd Huger, MD in 1 week.      Follow up with Corky Crafts., MD. (for pauses on telemetry , sleep  apnea)    Contact information:   [address] [place] [address] [phone]       Follow up with pain clinic in 2 weeks.          SignedRicharda Overlie 03/19/2011, 10:44 AM

## 2011-03-19 NOTE — Progress Notes (Signed)
After spending an extended period of time with pt. Explaining his d/c instructions/meds; our tech. Took him to his vehicle per w/c without incident.  He was able to verbalize understanding of d/c instructions/meds; and we had a discussion of his PCP referral; and that her office is very near [facility].

## 2011-03-23 LAB — CULTURE, BLOOD (ROUTINE X 2)
Culture  Setup Time: 201302150346
Culture  Setup Time: 201302150346
Culture: NO GROWTH

## 2011-04-26 ENCOUNTER — Emergency Department (HOSPITAL_COMMUNITY)
Admission: EM | Admit: 2011-04-26 | Discharge: 2011-04-26 | Disposition: A | Discharge status: Home Health | Payer: Medicaid Other | Attending: Emergency Medicine | Admitting: Emergency Medicine

## 2011-04-26 ENCOUNTER — Emergency Department (HOSPITAL_COMMUNITY): Payer: Medicaid Other

## 2011-04-26 ENCOUNTER — Encounter (HOSPITAL_COMMUNITY): Payer: Self-pay | Admitting: Emergency Medicine

## 2011-04-26 DIAGNOSIS — R110 Nausea: Secondary | ICD-10-CM

## 2011-04-26 DIAGNOSIS — R197 Diarrhea, unspecified: Secondary | ICD-10-CM

## 2011-04-26 DIAGNOSIS — I10 Essential (primary) hypertension: Secondary | ICD-10-CM | POA: Insufficient documentation

## 2011-04-26 DIAGNOSIS — G4730 Sleep apnea, unspecified: Secondary | ICD-10-CM | POA: Insufficient documentation

## 2011-04-26 DIAGNOSIS — E119 Type 2 diabetes mellitus without complications: Secondary | ICD-10-CM | POA: Insufficient documentation

## 2011-04-26 DIAGNOSIS — R109 Unspecified abdominal pain: Secondary | ICD-10-CM | POA: Insufficient documentation

## 2011-04-26 LAB — CBC
Hemoglobin: 14.5 g/dL (ref 13.0–17.0)
MCHC: 34.2 g/dL (ref 30.0–36.0)
Platelets: 242 10*3/uL (ref 150–400)
RBC: 4.84 MIL/uL (ref 4.22–5.81)

## 2011-04-26 LAB — URINALYSIS, ROUTINE W REFLEX MICROSCOPIC
Leukocytes, UA: NEGATIVE
Nitrite: NEGATIVE
Specific Gravity, Urine: 1.029 (ref 1.005–1.030)
Urobilinogen, UA: 1 mg/dL (ref 0.0–1.0)
pH: 6 (ref 5.0–8.0)

## 2011-04-26 LAB — BLOOD GAS, VENOUS
Acid-Base Excess: 2.4 mmol/L — ABNORMAL HIGH (ref 0.0–2.0)
pCO2, Ven: 42 mmHg — ABNORMAL LOW (ref 45.0–50.0)
pH, Ven: 7.419 — ABNORMAL HIGH (ref 7.250–7.300)
pO2, Ven: 61.6 mmHg — ABNORMAL HIGH (ref 30.0–45.0)

## 2011-04-26 LAB — COMPREHENSIVE METABOLIC PANEL
ALT: 19 U/L (ref 0–53)
AST: 15 U/L (ref 0–37)
Albumin: 3.3 g/dL — ABNORMAL LOW (ref 3.5–5.2)
Alkaline Phosphatase: 64 U/L (ref 39–117)
Calcium: 8.8 mg/dL (ref 8.4–10.5)
GFR calc Af Amer: >90 mL/min (ref >90)
Glucose, Bld: 140 mg/dL — ABNORMAL HIGH (ref 70–99)
Potassium: 3.6 mEq/L (ref 3.5–5.1)
Sodium: 139 mEq/L (ref 135–145)
Total Protein: 7.9 g/dL (ref 6.0–8.3)

## 2011-04-26 LAB — DIFFERENTIAL
Basophils Relative: 0 % (ref 0–1)
Lymphs Abs: 3 10*3/uL (ref 0.7–4.0)
Monocytes Relative: 7 % (ref 3–12)
Neutro Abs: 6.6 10*3/uL (ref 1.7–7.7)
Neutrophils Relative %: 62 % (ref 43–77)

## 2011-04-26 LAB — GLUCOSE, CAPILLARY: Glucose-Capillary: 178 mg/dL — ABNORMAL HIGH (ref 70–99)

## 2011-04-26 MED ORDER — HYDROCODONE-ACETAMINOPHEN 5-325 MG PO TABS
1.0000 | ORAL_TABLET | Freq: Two times a day (BID) | ORAL | Status: AC | PRN
Start: 2011-04-26 — End: 2011-05-06

## 2011-04-26 MED ORDER — ONDANSETRON 8 MG PO TBDP: 8.0000 mg | ORAL_TABLET | Freq: Three times a day (TID) | ORAL | Status: AC | PRN

## 2011-04-26 MED ORDER — ONDANSETRON HCL 4 MG/2ML IJ SOLN
4.0000 mg | Freq: Once | INTRAMUSCULAR | Status: AC
  Administered 2011-04-26: 4 mg via INTRAVENOUS
  Filled 2011-04-26: qty 2

## 2011-04-26 MED ORDER — SODIUM CHLORIDE 0.9 % IV BOLUS (SEPSIS)
1000.0000 mL | Freq: Once | INTRAVENOUS | Status: AC
  Administered 2011-04-26: 1000 mL via INTRAVENOUS

## 2011-04-26 MED ORDER — ALPRAZOLAM 1 MG PO TABS: 1.0000 mg | ORAL_TABLET | Freq: Every evening | ORAL | Status: DC | PRN

## 2011-04-26 MED ORDER — SODIUM CHLORIDE 0.9 % IV BOLUS (SEPSIS): 1000.0000 mL | Freq: Once | INTRAVENOUS | Status: DC

## 2011-04-26 NOTE — ED Provider Notes (Signed)
History     CSN: [id number]  Arrival date & time 04/26/11  0708   First MD Initiated Contact with Patient 04/26/11 [phone]      Chief Complaint  Patient presents with  . Diarrhea    (Consider location/radiation/quality/duration/timing/severity/associated sxs/prior treatment) HPI Patient is a 51 year old male presents today complaining of 2 days of diffuse mild abdominal cramping as well as profuse diarrhea. He denies any blood in his stools or dark tarry stools. Patient has had nausea but no vomiting or fevers. He reports that everyone in his office has been out with symptoms of gastroenteritis but they seem to have all had vomiting as well as diarrhea. The patient denies any urinary symptoms. Of note he was hospitalized within the past month with a pneumonia and did not complete his antibiotics on discharge as he reported they were too expensive. The patient is also a diabetic and has not been taking his metformin given that he already has diarrhea and this is normally a side effect he has to this medication. He did not know what his sugars have been at home. Patient is tachycardic on arrival but otherwise hemodynamically stable. His past surgical history is significant for cholecystectomy and otherwise unremarkable given his complaint. There is nothing that makes his symptoms better or worse.There are no other associated or modifying factors.  Past Medical History  Diagnosis Date  . Diabetes mellitus   . Hypertension   . Tachycardia   . Sleep apnea   . Sleep apnea     Past Surgical History  Procedure Date  . Cholecystectomy   . Knee 3 knee surgeries    History reviewed. No pertinent family history.  History  Substance Use Topics  . Smoking status: Current Everyday Smoker -- 0.7 packs/day for 10 years    Types: Cigarettes  . Smokeless tobacco: Never Used  . Alcohol Use: 0.0 oz/week    2-3 Cans of beer per week     2 cans of beer a month      Review of Systems    Constitutional: Negative.   HENT: Negative.   Eyes: Negative.   Respiratory: Negative.   Cardiovascular: Negative.   Gastrointestinal: Positive for nausea, abdominal pain and diarrhea.  Genitourinary: Negative.   Musculoskeletal: Negative.   Skin: Negative.   Neurological: Negative.   Hematological: Negative.   Psychiatric/Behavioral: Negative.   All other systems reviewed and are negative.    Allergies  Penicillins and Sulfa antibiotics  Home Medications   Current Outpatient Rx  Name Route Sig Dispense Refill  . ALBUTEROL SULFATE (5 MG/ML) 0.5% IN NEBU Nebulization Take 0.5 mLs (2.5 mg total) by nebulization every 6 (six) hours as needed for wheezing or shortness of breath. 20 mL 1    Home nebuliser device  . ALPRAZOLAM 1 MG PO TABS Oral Take 1 tablet (1 mg total) by mouth at bedtime as needed for sleep. 5 tablet 0  . CYCLOBENZAPRINE HCL 10 MG PO TABS Oral Take 10 mg by mouth 3 (three) times daily as needed.    Marland Kitchen GLIMEPIRIDE 4 MG PO TABS Oral Take 4 mg by mouth daily.     Marland Kitchen HYDROCODONE-ACETAMINOPHEN 2.5-500 MG PO TABS Oral Take 1 tablet by mouth 2 (two) times daily as needed for pain. 10 tablet 0  . INSULIN GLARGINE 100 UNIT/ML [place] SOLN Subcutaneous Inject 30 Units into the skin every morning.     . IPRATROPIUM BROMIDE 0.02 % IN SOLN Nebulization Take 2.5 mLs (0.5 mg total) by nebulization  every 6 (six) hours as needed. 75 mL 2    Home nebuliser device  . METFORMIN HCL 1000 MG PO TABS Oral Take 1,000 mg by mouth daily.     Marland Kitchen METOPROLOL TARTRATE 25 MG PO TABS Oral Take 1 tablet (25 mg total) by mouth 1 day or 1 dose. 10 tablet 0    BP 111/53  Pulse 120  Temp(Src) 98.4 F (36.9 C) (Oral)  Resp 18  SpO2 97%  Physical Exam  Nursing note and vitals reviewed. GEN: Well-developed, well-nourished male in no distress HEENT: Atraumatic, normocephalic. Oropharynx clear without erythema EYES: PERRLA BL, no scleral icterus. NECK: Trachea midline, no meningismus CV: Tachycardic  with regular rhythm. No murmurs, rubs, or gallops PULM: No respiratory distress.  No crackles, wheezes, or rales. Diminished at the right lung base. GI: soft, diffuse mild tenderness to palpation. No guarding, rebound. + bowel sounds  GU: deferred Neuro: cranial nerves 2-12 intact, no abnormalities of strength or sensation, A and O x 3 MSK: Patient moves all 4 extremities symmetrically, no deformity, edema, or injury noted Skin: No rashes petechiae, purpura, or jaundice Psych: no abnormality of mood   ED Course  Procedures (including critical care time)  Labs Reviewed  URINALYSIS, ROUTINE W REFLEX MICROSCOPIC - Abnormal; Notable for the following:    Glucose, UA 100 (*)    All other components within normal limits  CBC - Abnormal; Notable for the following:    WBC 10.8 (*)    All other components within normal limits  BLOOD GAS, VENOUS - Abnormal; Notable for the following:    pH, Ven 7.419 (*)    pCO2, Ven 42.0 (*)    pO2, Ven 61.6 (*)    Bicarbonate 26.7 (*)    Acid-Base Excess 2.4 (*)    All other components within normal limits  GLUCOSE, CAPILLARY - Abnormal; Notable for the following:    Glucose-Capillary 178 (*)    All other components within normal limits  COMPREHENSIVE METABOLIC PANEL - Abnormal; Notable for the following:    Glucose, Bld 140 (*)    Albumin 3.3 (*)    All other components within normal limits  DIFFERENTIAL  LIPASE, BLOOD  CLOSTRIDIUM DIFFICILE BY PCR  COMPREHENSIVE METABOLIC PANEL  LIPASE, BLOOD  BLOOD GAS, VENOUS   Dg Chest 2 View  04/26/2011  *RADIOLOGY REPORT*  Clinical Data: Recent pneumonia with cough and diarrhea.  CHEST - 2 VIEW  Comparison: CT chest 03/18/2011 and chest radiograph 03/16/2011.  Findings: Trachea is midline.  Heart size normal.  Lungs are clear. No pleural fluid.  IMPRESSION: No acute findings.  Original Report Authenticated By: Reyes Ivan, M.D.     1. Abdominal pain   2. Nausea alone   3. Diarrhea       MDM    Patient was evaluated and had workup ordered for his abdominal pain. Patient was treated symptomatically with IV fluids and antiemetics. He did not have an acute abdomen. Vitals were stable aside from tachycardia. Chest x-ray was also ordered given patient's recent pneumonia. Patient had a negative chest x-ray and lab workup was unremarkable. Patient did tell me that his heart rate is always in the 120s. He has been following up with his regular Dr. regarding this. Patient felt much better after IV fluids and Zofran. He had no infectious process noted on his labs. He also had no significant renal, hepatic, or pancreatic compromise. Patient was discharged in good condition with prescriptions for his chronic pain and anxiety  medication as well as antiemetics. Patient was also given a work note.He was discharged in good condition.        Cyndra Numbers, MD 04/26/11 1116

## 2011-04-26 NOTE — ED Notes (Signed)
Pt presenting to ed with c/o diarrhea x 2 days. Pt denies nausea and vomiting pt states severe abdominal cramping

## 2011-04-27 ENCOUNTER — Telehealth: Payer: Self-pay | Admitting: *Deleted

## 2011-04-27 NOTE — Telephone Encounter (Signed)
Message copied by Jose Persia on Thu Apr 27, 2011 10:52 AM ------      Message from: Durwin Reges      Created: Thu Apr 27, 2011  6:50 AM      Regarding: ED visit       This patient is assigned to me. In ED recently for diarrhea. Looks like he hasn't been seen at [facility] in 3 or so years, only phone notes. He may need an appointment or perhaps he is getting medical care somewhere else now?

## 2011-04-27 NOTE — Telephone Encounter (Signed)
Attempted to call patient to discuss recent ED visit.  No answer at home number and cell # not in service.  Will try to call home number again next week.  Gaylene Brooks, RN

## 2011-05-23 ENCOUNTER — Emergency Department (HOSPITAL_COMMUNITY)
Admission: EM | Admit: 2011-05-23 | Discharge: 2011-05-23 | Disposition: A | Discharge status: Home / Self Care | Payer: Medicaid Other | Attending: Emergency Medicine | Admitting: Emergency Medicine

## 2011-05-23 ENCOUNTER — Encounter (HOSPITAL_COMMUNITY): Payer: Self-pay | Admitting: Emergency Medicine

## 2011-05-23 ENCOUNTER — Emergency Department (HOSPITAL_COMMUNITY): Payer: Medicaid Other

## 2011-05-23 DIAGNOSIS — IMO0002 Reserved for concepts with insufficient information to code with codable children: Secondary | ICD-10-CM | POA: Insufficient documentation

## 2011-05-23 DIAGNOSIS — I10 Essential (primary) hypertension: Secondary | ICD-10-CM | POA: Insufficient documentation

## 2011-05-23 DIAGNOSIS — R209 Unspecified disturbances of skin sensation: Secondary | ICD-10-CM | POA: Insufficient documentation

## 2011-05-23 DIAGNOSIS — M792 Neuralgia and neuritis, unspecified: Secondary | ICD-10-CM

## 2011-05-23 DIAGNOSIS — Z794 Long term (current) use of insulin: Secondary | ICD-10-CM | POA: Insufficient documentation

## 2011-05-23 DIAGNOSIS — M79609 Pain in unspecified limb: Secondary | ICD-10-CM | POA: Insufficient documentation

## 2011-05-23 DIAGNOSIS — E119 Type 2 diabetes mellitus without complications: Secondary | ICD-10-CM | POA: Insufficient documentation

## 2011-05-23 DIAGNOSIS — Z79899 Other long term (current) drug therapy: Secondary | ICD-10-CM | POA: Insufficient documentation

## 2011-05-23 MED ORDER — OXYCODONE-ACETAMINOPHEN 5-325 MG PO TABS: 1.0000 | ORAL_TABLET | ORAL | Status: AC | PRN

## 2011-05-23 MED ORDER — OXYCODONE-ACETAMINOPHEN 5-325 MG PO TABS
2.0000 | ORAL_TABLET | Freq: Once | ORAL | Status: AC
  Administered 2011-05-23: 2 via ORAL
  Filled 2011-05-23: qty 2

## 2011-05-23 NOTE — ED Provider Notes (Signed)
Medical screening examination/treatment/procedure(s) were performed by non-physician practitioner and as supervising physician I was immediately available for consultation/collaboration.   Dione Booze, MD 05/23/11 2322

## 2011-05-23 NOTE — ED Provider Notes (Signed)
History     CSN: [id number]  Arrival date & time 05/23/11  1703   First MD Initiated Contact with Patient 05/23/11 1727      No chief complaint on file.   (Consider location/radiation/quality/duration/timing/severity/associated sxs/prior treatment) HPI  Pt presents to the ED with complaints of nerve pain, tingling and burning in his left foot. He is a diabetic and has dealt with neuropathy in the past. He states that he has been unable to walk on it since Sunday. He has seen Dr. Laural Benes for this before and was given Percocet which really helped the pain. He denies it being red, swollen. He denies chills, weakness, N/V/D/Fevers. He does admit to increased pain when you touch it. Denies injury or fall. Pt states he has been checking his sugars and they are under 150.  Past Medical History  Diagnosis Date  . Diabetes mellitus   . Hypertension   . Tachycardia   . Sleep apnea   . Sleep apnea     Past Surgical History  Procedure Date  . Cholecystectomy   . Knee 3 knee surgeries    No family history on file.  History  Substance Use Topics  . Smoking status: Current Everyday Smoker -- 0.7 packs/day for 10 years    Types: Cigarettes  . Smokeless tobacco: Never Used  . Alcohol Use: 0.0 oz/week    2-3 Cans of beer per week     2 cans of beer a month      Review of Systems   HEENT: denies blurry vision or change in hearing PULMONARY: Denies difficulty breathing and SOB CARDIAC: denies chest pain or heart palpitations MUSCULOSKELETAL:  denies being unable to ambulate ABDOMEN AL: denies abdominal pain GU: denies loss of bowel or urinary control NEURO: denies weakness in lower extremities   Allergies  Penicillins and Sulfa antibiotics  Home Medications   Current Outpatient Rx  Name Route Sig Dispense Refill  . ALBUTEROL SULFATE HFA 108 (90 BASE) MCG/ACT IN AERS Inhalation Inhale 2 puffs into the lungs every 6 (six) hours as needed. For shortness of breath.    .  ALPRAZOLAM 1 MG PO TABS Oral Take 1 mg by mouth at bedtime as needed. For anxiety.    . CYCLOBENZAPRINE HCL 10 MG PO TABS Oral Take 10 mg by mouth 3 (three) times daily as needed. For muscle spasms.    Marland Kitchen GLIMEPIRIDE 4 MG PO TABS Oral Take 4 mg by mouth daily.     Marland Kitchen HYDROCODONE-ACETAMINOPHEN 2.5-500 MG PO TABS Oral Take 1 tablet by mouth 2 (two) times daily as needed. For pain.    . INSULIN GLARGINE 100 UNIT/ML [place] SOLN Subcutaneous Inject 30 Units into the skin at bedtime.     . IPRATROPIUM BROMIDE HFA 17 MCG/ACT IN AERS Inhalation Inhale 2 puffs into the lungs every 6 (six) hours. For shortness of breath.    . METFORMIN HCL 1000 MG PO TABS Oral Take 1,000 mg by mouth daily.     Marland Kitchen METOPROLOL TARTRATE 25 MG PO TABS Oral Take 50 mg by mouth 2 (two) times daily.    . ALBUTEROL SULFATE (5 MG/ML) 0.5% IN NEBU Nebulization Take 2.5 mg by nebulization every 6 (six) hours as needed. For shortness of breath.    . OXYCODONE-ACETAMINOPHEN 5-325 MG PO TABS Oral Take 1 tablet by mouth every 4 (four) hours as needed for pain. 12 tablet 0    BP 123/74  Pulse 108  Temp 99.9 F (37.7 C)  Resp 18  SpO2 98%  Physical Exam  Nursing note and vitals reviewed. Constitutional: He appears well-developed and well-nourished. No distress.  HENT:  Head: Normocephalic and atraumatic.  Eyes: Pupils are equal, round, and reactive to light.  Neck: Normal range of motion. Neck supple.  Cardiovascular: Normal rate and regular rhythm.   Pulmonary/Chest: Effort normal.  Abdominal: Soft.  Musculoskeletal:       Right foot: He exhibits tenderness. He exhibits normal range of motion, no bony tenderness, no swelling, normal capillary refill, no crepitus, no deformity and no laceration.       Pt has sensation of all five toes. No wounds noted. Pt feels burning sensation inside of foot.  Neurological: He is alert.  Skin: Skin is warm and dry.    ED Course  Procedures (including critical care time)  Labs Reviewed - No  data to display Dg Foot Complete Left  05/23/2011  *RADIOLOGY REPORT*  Clinical Data: Left foot pain.  Diabetic neuropathy.  LEFT FOOT - COMPLETE 3+ VIEW  Comparison: 08/18/2009  Findings: No fracture or dislocation.  No soft tissue abnormality. No radiopaque foreign body.  IMPRESSION: Normal exam.  Given the history of diabetic neuropathy, if there is strong clinical suspicion for occult fracture or osteomyelitis, MRI with contrast would be recommended.  Original Report Authenticated By: Harrel Lemon, M.D.     1. Neuropathic pain       MDM  Pts PCP is Dr. Bruna Potter. He requests and new PCP since Dr. Bruna Potter is hard to to get in to see these days. Pt appears to be complaint with his diabetes treatment. Pts pain does appear to be neuropathic. I do not see concerning signs of osteomyelitis or occult fracture, nor  weakness in the leg. Will give referral to new PCP and an Rx for percocet for pain.  Pt has been advised of the symptoms that warrant their return to the ED. Patient has voiced understanding and has agreed to follow-up with the PCP or specialist.         Dorthula Matas, PA 05/23/11 1825

## 2011-05-23 NOTE — Discharge Instructions (Signed)

## 2011-05-23 NOTE — ED Notes (Signed)
Pt reports pain to left foot 9/10 since Monday morning. Pt denies fall or injury.

## 2011-06-21 ENCOUNTER — Other Ambulatory Visit: Payer: Self-pay | Admitting: Family Medicine

## 2012-01-19 ENCOUNTER — Encounter (HOSPITAL_COMMUNITY): Payer: Self-pay | Admitting: Family Medicine

## 2012-01-19 ENCOUNTER — Emergency Department (HOSPITAL_COMMUNITY)
Admission: EM | Admit: 2012-01-19 | Discharge: 2012-01-19 | Disposition: A | Discharge status: Home / Self Care | Payer: Self-pay | Attending: Emergency Medicine | Admitting: Emergency Medicine

## 2012-01-19 ENCOUNTER — Emergency Department (HOSPITAL_COMMUNITY): Payer: Self-pay

## 2012-01-19 DIAGNOSIS — Z8679 Personal history of other diseases of the circulatory system: Secondary | ICD-10-CM | POA: Insufficient documentation

## 2012-01-19 DIAGNOSIS — G4730 Sleep apnea, unspecified: Secondary | ICD-10-CM | POA: Insufficient documentation

## 2012-01-19 DIAGNOSIS — J069 Acute upper respiratory infection, unspecified: Secondary | ICD-10-CM | POA: Insufficient documentation

## 2012-01-19 DIAGNOSIS — Z79899 Other long term (current) drug therapy: Secondary | ICD-10-CM | POA: Insufficient documentation

## 2012-01-19 DIAGNOSIS — I10 Essential (primary) hypertension: Secondary | ICD-10-CM | POA: Insufficient documentation

## 2012-01-19 DIAGNOSIS — E119 Type 2 diabetes mellitus without complications: Secondary | ICD-10-CM | POA: Insufficient documentation

## 2012-01-19 DIAGNOSIS — Z8701 Personal history of pneumonia (recurrent): Secondary | ICD-10-CM | POA: Insufficient documentation

## 2012-01-19 DIAGNOSIS — Z87891 Personal history of nicotine dependence: Secondary | ICD-10-CM | POA: Insufficient documentation

## 2012-01-19 LAB — POCT I-STAT, CHEM 8
Calcium, Ion: 1.16 mmol/L (ref 1.12–1.23)
Chloride: 102 mEq/L (ref 96–112)
HCT: 49 % (ref 39.0–52.0)
Sodium: 141 mEq/L (ref 135–145)

## 2012-01-19 LAB — URINALYSIS, ROUTINE W REFLEX MICROSCOPIC
Glucose, UA: NEGATIVE mg/dL
Leukocytes, UA: NEGATIVE
Nitrite: NEGATIVE
Specific Gravity, Urine: 1.038 — ABNORMAL HIGH (ref 1.005–1.030)
pH: 5 (ref 5.0–8.0)

## 2012-01-19 LAB — URINE MICROSCOPIC-ADD ON

## 2012-01-19 LAB — GLUCOSE, CAPILLARY: Glucose-Capillary: 218 mg/dL — ABNORMAL HIGH (ref 70–99)

## 2012-01-19 MED ORDER — SODIUM CHLORIDE 0.9 % IV BOLUS (SEPSIS)
1000.0000 mL | Freq: Once | INTRAVENOUS | Status: AC
  Administered 2012-01-19: 1000 mL via INTRAVENOUS

## 2012-01-19 MED ORDER — ALBUTEROL SULFATE (5 MG/ML) 0.5% IN NEBU
5.0000 mg | INHALATION_SOLUTION | Freq: Once | RESPIRATORY_TRACT | Status: AC
  Administered 2012-01-19: 5 mg via RESPIRATORY_TRACT
  Filled 2012-01-19: qty 1

## 2012-01-19 MED ORDER — ALBUTEROL SULFATE HFA 108 (90 BASE) MCG/ACT IN AERS
2.0000 | INHALATION_SPRAY | RESPIRATORY_TRACT | Status: DC | PRN
  Administered 2012-01-19: 2 via RESPIRATORY_TRACT
  Filled 2012-01-19: qty 6.7

## 2012-01-19 MED ORDER — GLIMEPIRIDE 4 MG PO TABS: 4.0000 mg | ORAL_TABLET | Freq: Every day | ORAL | Status: DC

## 2012-01-19 MED ORDER — METFORMIN HCL 1000 MG PO TABS: 1000.0000 mg | ORAL_TABLET | Freq: Every day | ORAL | Status: DC

## 2012-01-19 MED ORDER — SODIUM CHLORIDE 0.9 % IV BOLUS (SEPSIS): 1000.0000 mL | Freq: Once | INTRAVENOUS | Status: DC

## 2012-01-19 MED ORDER — IPRATROPIUM BROMIDE 0.02 % IN SOLN
0.5000 mg | Freq: Once | RESPIRATORY_TRACT | Status: AC
  Administered 2012-01-19: 0.5 mg via RESPIRATORY_TRACT
  Filled 2012-01-19: qty 2.5

## 2012-01-19 MED ORDER — METOPROLOL TARTRATE 25 MG PO TABS: 25.0000 mg | ORAL_TABLET | Freq: Two times a day (BID) | ORAL | Status: DC

## 2012-01-19 MED ORDER — ACETAMINOPHEN 325 MG PO TABS
650.0000 mg | ORAL_TABLET | Freq: Once | ORAL | Status: AC
  Administered 2012-01-19: 650 mg via ORAL
  Filled 2012-01-19: qty 2

## 2012-01-19 NOTE — ED Notes (Signed)
Pt back from radiology 

## 2012-01-19 NOTE — ED Notes (Signed)
Patient states he came home Wednesday from work due to sweating, cough and pain with breathing. Has a history of pneumonia. Productive cough with yellow sputum. Also c/o urinary pressure and low back pain. Has not had Toprol, Glimepride, and Glucophage for over 2 months.

## 2012-01-19 NOTE — ED Notes (Signed)
Pt requesting a work note. 

## 2012-01-19 NOTE — ED Notes (Signed)
Family at bedside. 

## 2012-01-19 NOTE — ED Provider Notes (Signed)
History     CSN: [id number]  Arrival date & time 01/19/12  4098   First MD Initiated Contact with Patient 01/19/12 0630      Chief Complaint  Patient presents with  . Shortness of Breath    (Consider location/radiation/quality/duration/timing/severity/associated sxs/prior treatment) HPI  Difficult pt interview because he looses track very easily. Pt is here for cough, hot sweats, and SOB  for a couple of days. He presents to the ER because he is concerned that he has pneumonia as he  was diagnosed with it earlier in the year. He also is concerned because he is out of his Albuterol  inhaler. He is a diabetic and not on his diabetes medications. He also has some heart rate control  problems and hypertension for which he has not been taking his Metoprolol. Pt used to be a smoker  but recently stopped smoking. He also has been working on his diet and exercise.  He is in no distress, his vitals are stable, he is speaking in full sentences.   Past Medical History  Diagnosis Date  . Diabetes mellitus   . Hypertension   . Tachycardia   . Sleep apnea   . Sleep apnea     Past Surgical History  Procedure Date  . Cholecystectomy   . Knee 3 knee surgeries    No family history on file.  History  Substance Use Topics  . Smoking status: Former Smoker -- 0.7 packs/day for 10 years    Types: Cigarettes  . Smokeless tobacco: Never Used  . Alcohol Use: 0.0 oz/week     Comment: 2 cans of beer a month      Review of Systems  Review of Systems  Gen: no weight loss, fevers, night sweats + chills and hot flashes Eyes: no discharge or drainage, no occular pain or visual changes  Nose: no epistaxis or rhinorrhea  Mouth: no dental pain, no sore throat  Neck: no neck pain  Lungs:No wheezing, or hemoptysis, +coughing  CV: no chest pain, palpitations, dependent edema or orthopnea  Abd: no abdominal pain, nausea, vomiting  GU: no dysuria or gross hematuria  MSK:  No abnormalities   Neuro: no headache, no focal neurologic deficits  Skin: no abnormalities Psyche: negative.   Allergies  Penicillins; Statins; and Sulfa antibiotics  Home Medications   Current Outpatient Rx  Name  Route  Sig  Dispense  Refill  . ALBUTEROL SULFATE HFA 108 (90 BASE) MCG/ACT IN AERS   Inhalation   Inhale 2 puffs into the lungs every 6 (six) hours as needed. For shortness of breath.         . ALPRAZOLAM 1 MG PO TABS   Oral   Take 1 mg by mouth at bedtime as needed. For anxiety.         . CYCLOBENZAPRINE HCL 10 MG PO TABS   Oral   Take 10 mg by mouth 3 (three) times daily as needed. For muscle spasms.         Marland Kitchen HYDROCODONE-ACETAMINOPHEN 2.5-500 MG PO TABS   Oral   Take 1 tablet by mouth 2 (two) times daily as needed. For pain.         . IPRATROPIUM BROMIDE HFA 17 MCG/ACT IN AERS   Inhalation   Inhale 2 puffs into the lungs every 6 (six) hours. For shortness of breath.         . ALBUTEROL SULFATE (5 MG/ML) 0.5% IN NEBU   Nebulization   Take 2.5  mg by nebulization every 6 (six) hours as needed. For shortness of breath.         Marland Kitchen GLIMEPIRIDE 4 MG PO TABS   Oral   Take 1 tablet (4 mg total) by mouth daily.   30 tablet   2   . METFORMIN HCL 1000 MG PO TABS   Oral   Take 1 tablet (1,000 mg total) by mouth daily.   30 tablet   2   . METOPROLOL TARTRATE 25 MG PO TABS   Oral   Take 1 tablet (25 mg total) by mouth 2 (two) times daily.   30 tablet   2     BP 114/80  Pulse 96  Temp 98.2 F (36.8 C) (Oral)  Resp 18  Wt 253 lb (114.76 kg)  SpO2 97%  Physical Exam  Nursing note and vitals reviewed. Constitutional: He appears well-developed and well-nourished. No distress.  HENT:  Head: Normocephalic and atraumatic.  Eyes: Pupils are equal, round, and reactive to light.  Neck: Normal range of motion. Neck supple.  Cardiovascular: Normal rate and regular rhythm.   Pulmonary/Chest: Effort normal. No respiratory distress. He has no wheezes (shortness of  breath and cough). He has no rales. He exhibits no tenderness.  Abdominal: Soft.  Neurological: He is alert.  Skin: Skin is warm and dry.    ED Course  Procedures (including critical care time)  Labs Reviewed  GLUCOSE, CAPILLARY - Abnormal; Notable for the following:    Glucose-Capillary 218 (*)     All other components within normal limits  URINALYSIS, ROUTINE W REFLEX MICROSCOPIC - Abnormal; Notable for the following:    Color, Urine AMBER (*)  BIOCHEMICALS MAY BE AFFECTED BY COLOR   APPearance CLOUDY (*)     Specific Gravity, Urine 1.038 (*)     Bilirubin Urine SMALL (*)     Ketones, ur TRACE (*)     Protein, ur 30 (*)     All other components within normal limits  URINE MICROSCOPIC-ADD ON - Abnormal; Notable for the following:    Bacteria, UA FEW (*)     Casts HYALINE CASTS (*)     All other components within normal limits  POCT I-STAT, CHEM 8 - Abnormal; Notable for the following:    Potassium 3.3 (*)     Glucose, Bld 172 (*)     All other components within normal limits  URINE CULTURE   Dg Chest 2 View  01/19/2012  *RADIOLOGY REPORT*  Clinical Data: Shortness of breath  CHEST - 2 VIEW  Comparison: 04/26/2011  Findings: No confluent airspace opacity, pleural effusion, or pneumothorax.  Cardiomediastinal contours are within normal range. Mild multilevel degenerative changes. Surgical clips right upper quadrant.  IMPRESSION: No radiographic evidence of acute cardiopulmonary process.   Original Report Authenticated By: Jearld Lesch, M.D.      1. Diabetes   2. URI (upper respiratory infection)       MDM   Date: 01/19/2012  Rate: 108  Rhythm: sinus tachycardia  QRS Axis: normal  Intervals: normal  ST/T Wave abnormalities: normal  Conduction Disutrbances:none  Narrative Interpretation:   Old EKG Reviewed: none available   Pt checks BP and sugars daily. Seems reliable. He received nebulizer tx in the ED and 1L normal saline. Not clinically dehydrated. Pt says  he is feeling much better and is ready to go.  Discharge instructions Please do not take any insulin. Only use Metformin and Glimepiride for management of your diabetes and check  your sugars daily.  Kim from [facility] will be calling you to offer support for your diabetes and rate control medications.  You can use the albuterol inhaler for your cough. You can take the cough medication as needed. Please drink plenty of fluids. Your chest xray today showed no pneumonia but if you are not feeling better i na couple of days you may need re-evaluation.   Pt has been advised of the symptoms that warrant their return to the ED. Patient has voiced understanding and has agreed to follow-up with the PCP or specialist.    Dorthula Matas, PA 01/19/12 [phone]

## 2012-01-19 NOTE — ED Notes (Signed)
Pt to radiology.

## 2012-01-19 NOTE — ED Notes (Signed)
Pt states that he needs a refill of Toprol, Glucophage, Glimepiride.

## 2012-01-19 NOTE — ED Provider Notes (Signed)
Medical screening examination/treatment/procedure(s) were performed by non-physician practitioner and as supervising physician I was immediately available for consultation/collaboration.  Dayelin Balducci L Panhia Karl, MD 01/19/12 1623 

## 2012-01-20 LAB — URINE CULTURE: Culture: NO GROWTH

## 2012-03-18 ENCOUNTER — Emergency Department (HOSPITAL_COMMUNITY): Payer: Self-pay

## 2012-03-18 ENCOUNTER — Encounter (HOSPITAL_COMMUNITY): Payer: Self-pay | Admitting: Emergency Medicine

## 2012-03-18 ENCOUNTER — Emergency Department (HOSPITAL_COMMUNITY)
Admission: EM | Admit: 2012-03-18 | Discharge: 2012-03-18 | Discharge status: Against Medical Advice | Payer: Self-pay | Attending: Emergency Medicine | Admitting: Emergency Medicine

## 2012-03-18 DIAGNOSIS — Z87891 Personal history of nicotine dependence: Secondary | ICD-10-CM | POA: Insufficient documentation

## 2012-03-18 DIAGNOSIS — R000 Tachycardia, unspecified: Secondary | ICD-10-CM | POA: Insufficient documentation

## 2012-03-18 DIAGNOSIS — G4700 Insomnia, unspecified: Secondary | ICD-10-CM | POA: Insufficient documentation

## 2012-03-18 DIAGNOSIS — Z79899 Other long term (current) drug therapy: Secondary | ICD-10-CM | POA: Insufficient documentation

## 2012-03-18 DIAGNOSIS — G4730 Sleep apnea, unspecified: Secondary | ICD-10-CM | POA: Insufficient documentation

## 2012-03-18 DIAGNOSIS — R109 Unspecified abdominal pain: Secondary | ICD-10-CM | POA: Insufficient documentation

## 2012-03-18 DIAGNOSIS — I10 Essential (primary) hypertension: Secondary | ICD-10-CM | POA: Insufficient documentation

## 2012-03-18 DIAGNOSIS — E119 Type 2 diabetes mellitus without complications: Secondary | ICD-10-CM | POA: Insufficient documentation

## 2012-03-18 DIAGNOSIS — R197 Diarrhea, unspecified: Secondary | ICD-10-CM | POA: Insufficient documentation

## 2012-03-18 LAB — CBC WITH DIFFERENTIAL/PLATELET
Basophils Absolute: 0 10*3/uL (ref 0.0–0.1)
HCT: 44.8 % (ref 39.0–52.0)
Lymphocytes Relative: 34 % (ref 12–46)
Lymphs Abs: 4 10*3/uL (ref 0.7–4.0)
Monocytes Absolute: 1.1 10*3/uL — ABNORMAL HIGH (ref 0.1–1.0)
Neutro Abs: 6.6 10*3/uL (ref 1.7–7.7)
Platelets: 305 10*3/uL (ref 150–400)
RBC: 5.06 MIL/uL (ref 4.22–5.81)
RDW: 14.4 % (ref 11.5–15.5)
WBC: 12.1 10*3/uL — ABNORMAL HIGH (ref 4.0–10.5)

## 2012-03-18 LAB — COMPREHENSIVE METABOLIC PANEL
ALT: 13 U/L (ref 0–53)
AST: 13 U/L (ref 0–37)
CO2: 27 mEq/L (ref 19–32)
Chloride: 101 mEq/L (ref 96–112)
GFR calc non Af Amer: >90 mL/min (ref >90)
Sodium: 141 mEq/L (ref 135–145)
Total Bilirubin: 0.4 mg/dL (ref 0.3–1.2)

## 2012-03-18 LAB — URINALYSIS, MICROSCOPIC ONLY
Glucose, UA: NEGATIVE mg/dL
Hgb urine dipstick: NEGATIVE
Leukocytes, UA: NEGATIVE
Protein, ur: 30 mg/dL — AB
Specific Gravity, Urine: 1.029 (ref 1.005–1.030)
pH: 6 (ref 5.0–8.0)

## 2012-03-18 MED ORDER — SODIUM CHLORIDE 0.9 % IV SOLN
INTRAVENOUS | Status: DC
  Administered 2012-03-18: 17:00:00 via INTRAVENOUS

## 2012-03-18 MED ORDER — HYDROMORPHONE HCL PF 1 MG/ML IJ SOLN
1.0000 mg | Freq: Once | INTRAMUSCULAR | Status: AC
  Administered 2012-03-18: 1 mg via INTRAVENOUS
  Filled 2012-03-18: qty 1

## 2012-03-18 MED ORDER — ONDANSETRON HCL 4 MG/2ML IJ SOLN
4.0000 mg | Freq: Once | INTRAMUSCULAR | Status: AC
  Administered 2012-03-18: 4 mg via INTRAVENOUS
  Filled 2012-03-18: qty 2

## 2012-03-18 MED ORDER — HYDROCODONE-ACETAMINOPHEN 5-325 MG PO TABS: 1.0000 | ORAL_TABLET | ORAL | Status: DC | PRN

## 2012-03-18 MED ORDER — POTASSIUM CHLORIDE CRYS ER 20 MEQ PO TBCR
40.0000 meq | EXTENDED_RELEASE_TABLET | Freq: Once | ORAL | Status: AC
  Administered 2012-03-18: 40 meq via ORAL
  Filled 2012-03-18: qty 2

## 2012-03-18 MED ORDER — ALPRAZOLAM 1 MG PO TABS: 1.0000 mg | ORAL_TABLET | Freq: Every evening | ORAL | Status: DC | PRN

## 2012-03-18 NOTE — ED Notes (Signed)
Pt presenting to ed with c/o abdominal pain x 3-4 days. Pt state he always has diarrhea due to being on Glucophage. Pt states no nausea and vomiting at this time

## 2012-03-18 NOTE — ED Provider Notes (Signed)
History     CSN: [id number]  Arrival date & time 03/18/12  1443   First MD Initiated Contact with Patient 03/18/12 1541      Chief Complaint  Patient presents with  . Abdominal Pain    (Consider location/radiation/quality/duration/timing/severity/associated sxs/prior treatment) HPI Comments: Cody Nolan is a 52 y.o. Male who has had ongoing abdominal pain for several days that waxes and wanes. The pain is dull. It does not radiate. He does not have vomiting, nausea, or constipation. He continues to have daily loose stools at least twice a day; which has been normal for him for several years. He denies fever, chills, cough, chest pain, shortness of breath with this or dizziness. He has not had this problem previously. He has had trouble sleeping the last several days, because he is out of his Xanax.   Patient is a 52 y.o. male presenting with abdominal pain. The history is provided by the patient.  Abdominal Pain   Past Medical History  Diagnosis Date  . Diabetes mellitus   . Hypertension   . Tachycardia   . Sleep apnea   . Sleep apnea     Past Surgical History  Procedure Laterality Date  . Cholecystectomy    . Knee  3 knee surgeries    No family history on file.  History  Substance Use Topics  . Smoking status: Former Smoker -- 0.75 packs/day for 10 years    Types: Cigarettes  . Smokeless tobacco: Never Used  . Alcohol Use: 0.0 oz/week     Comment: 2 cans of beer a month      Review of Systems  Gastrointestinal: Positive for abdominal pain.  All other systems reviewed and are negative.    Allergies  Penicillins and Sulfa antibiotics  Home Medications   Current Outpatient Rx  Name  Route  Sig  Dispense  Refill  . glimepiride (AMARYL) 4 MG tablet   Oral   Take 1 tablet (4 mg total) by mouth daily.   30 tablet   2   . metFORMIN (GLUCOPHAGE) 1000 MG tablet   Oral   Take 1,000 mg by mouth 2 (two) times daily with a meal.         . metoprolol  tartrate (LOPRESSOR) 25 MG tablet   Oral   Take 50 mg by mouth at bedtime.         . ALPRAZolam (XANAX) 1 MG tablet   Oral   Take 1 tablet (1 mg total) by mouth at bedtime as needed for sleep.   30 tablet   0   . HYDROcodone-acetaminophen (NORCO/VICODIN) 5-325 MG per tablet   Oral   Take 1 tablet by mouth every 4 (four) hours as needed for pain.   20 tablet   0     BP 137/92  Pulse 96  Temp(Src) 98.1 F (36.7 C) (Oral)  Resp 20  SpO2 96%  Physical Exam  Nursing note and vitals reviewed. Constitutional: He is oriented to person, place, and time. He appears well-developed and well-nourished.  HENT:  Head: Normocephalic and atraumatic.  Right Ear: External ear normal.  Left Ear: External ear normal.  Eyes: Conjunctivae and EOM are normal. Pupils are equal, round, and reactive to light.  Neck: Normal range of motion and phonation normal. Neck supple.  Cardiovascular: Normal rate, regular rhythm, normal heart sounds and intact distal pulses.   Pulmonary/Chest: Effort normal and breath sounds normal. He exhibits no bony tenderness.  Abdominal: Soft. Normal appearance and  bowel sounds are normal. He exhibits no distension and no mass. There is tenderness. There is no guarding.  Mild epigastric discomfort to light touch. Mild diastases or of the upper abdomen. No distinct palpable hernia.  Musculoskeletal: Normal range of motion.  Neurological: He is alert and oriented to person, place, and time. He has normal strength. No cranial nerve deficit or sensory deficit. He exhibits normal muscle tone. Coordination normal.  Skin: Skin is warm, dry and intact.  Psychiatric: He has a normal mood and affect. His behavior is normal. Judgment and thought content normal.    ED Course  Procedures (including critical care time)  Emergency department treatment: IV fluid bolus, and drip. IV, Dilaudid, and Zofran.  Reassessment discharge: Patient is comfortable. He is tolerating oral fluids  and food. Blood pressure, heart rate, respiratory rate: all Normal.     Labs Reviewed  CBC WITH DIFFERENTIAL - Abnormal; Notable for the following:    WBC 12.1 (*)    Monocytes Absolute 1.1 (*)    All other components within normal limits  COMPREHENSIVE METABOLIC PANEL - Abnormal; Notable for the following:    Potassium 3.0 (*)    Glucose, Bld 120 (*)    Total Protein 8.4 (*)    All other components within normal limits  URINALYSIS, MICROSCOPIC ONLY - Abnormal; Notable for the following:    Color, Urine AMBER (*)    Bilirubin Urine SMALL (*)    Ketones, ur TRACE (*)    Protein, ur 30 (*)    Bacteria, UA FEW (*)    Casts HYALINE CASTS (*)    Crystals CA OXALATE CRYSTALS (*)    All other components within normal limits  LIPASE, BLOOD   Dg Abd Acute W/chest  03/18/2012  *RADIOLOGY REPORT*  Clinical Data: Upper abdominal pain with swelling and diarrhea.  ACUTE ABDOMEN SERIES (ABDOMEN 2 VIEW & CHEST 1 VIEW)  Comparison: Chest radiographs 01/19/2012.  Abdominal pelvic CT 11/15/2006.  Findings: The heart size and mediastinal contours are normal. The lungs are clear. There is no pleural effusion or pneumothorax. No acute osseous findings are identified.  Mild thoracic spine degenerative changes are noted.  Abdominal radiographs demonstrate a few scattered air fluid levels within the colon which appears mildly distended.  Air and stool are present within the rectum.  I do not see any definite dilated loops of small bowel.  There is no free intraperitoneal air. Cholecystectomy clips are noted. There are mild degenerative changes in the spine and both hips.  IMPRESSION:  1.  No acute cardiopulmonary process identified. 2.  Nonspecific bowel gas pattern without evidence of mechanical obstruction.  Scattered colonic air fluid levels are nonspecific and may be related to diarrhea.   Original Report Authenticated By: Carey Bullocks, M.D.    Nursing notes, applicable records and vitals  reviewed.  Radiologic Images/Reports reviewed.   1. Abdominal pain   2. Insomnia       MDM  Nonspecific abdominal pain, improved, with treatment in the ED. Doubt colitis, internal GI bleeding, visceral perforation or bowel obstruction. Doubt metabolic instability, serious bacterial infection or impending vascular collapse; the patient is stable for discharge.    Plan: Home Medications- Norco; Home Treatments- Gradually advance diet; Recommended follow up- PCP prn         Flint Melter, MD 03/18/12 2358

## 2012-07-05 ENCOUNTER — Emergency Department (HOSPITAL_COMMUNITY): Payer: BC Managed Care – PPO

## 2012-07-05 ENCOUNTER — Encounter (HOSPITAL_COMMUNITY): Payer: Self-pay

## 2012-07-05 ENCOUNTER — Observation Stay (HOSPITAL_COMMUNITY): Payer: BC Managed Care – PPO

## 2012-07-05 ENCOUNTER — Observation Stay (HOSPITAL_COMMUNITY)
Admission: EM | Admit: 2012-07-05 | Discharge: 2012-07-06 | Disposition: A | Discharge status: Home / Self Care | Payer: BC Managed Care – PPO | Attending: Internal Medicine | Admitting: Internal Medicine

## 2012-07-05 DIAGNOSIS — I10 Essential (primary) hypertension: Secondary | ICD-10-CM

## 2012-07-05 DIAGNOSIS — Z9114 Patient's other noncompliance with medication regimen: Secondary | ICD-10-CM

## 2012-07-05 DIAGNOSIS — J359 Chronic disease of tonsils and adenoids, unspecified: Principal | ICD-10-CM | POA: Insufficient documentation

## 2012-07-05 DIAGNOSIS — M25511 Pain in right shoulder: Secondary | ICD-10-CM

## 2012-07-05 DIAGNOSIS — K219 Gastro-esophageal reflux disease without esophagitis: Secondary | ICD-10-CM | POA: Insufficient documentation

## 2012-07-05 DIAGNOSIS — D72829 Elevated white blood cell count, unspecified: Secondary | ICD-10-CM | POA: Insufficient documentation

## 2012-07-05 DIAGNOSIS — R1310 Dysphagia, unspecified: Secondary | ICD-10-CM | POA: Insufficient documentation

## 2012-07-05 DIAGNOSIS — Z9119 Patient's noncompliance with other medical treatment and regimen: Secondary | ICD-10-CM

## 2012-07-05 DIAGNOSIS — R220 Localized swelling, mass and lump, head: Secondary | ICD-10-CM | POA: Insufficient documentation

## 2012-07-05 DIAGNOSIS — E785 Hyperlipidemia, unspecified: Secondary | ICD-10-CM | POA: Insufficient documentation

## 2012-07-05 DIAGNOSIS — M25569 Pain in unspecified knee: Secondary | ICD-10-CM | POA: Insufficient documentation

## 2012-07-05 DIAGNOSIS — R221 Localized swelling, mass and lump, neck: Secondary | ICD-10-CM | POA: Insufficient documentation

## 2012-07-05 DIAGNOSIS — G8929 Other chronic pain: Secondary | ICD-10-CM | POA: Insufficient documentation

## 2012-07-05 DIAGNOSIS — Z23 Encounter for immunization: Secondary | ICD-10-CM | POA: Insufficient documentation

## 2012-07-05 DIAGNOSIS — M25561 Pain in right knee: Secondary | ICD-10-CM

## 2012-07-05 DIAGNOSIS — F17200 Nicotine dependence, unspecified, uncomplicated: Secondary | ICD-10-CM | POA: Insufficient documentation

## 2012-07-05 DIAGNOSIS — K1370 Unspecified lesions of oral mucosa: Secondary | ICD-10-CM

## 2012-07-05 DIAGNOSIS — Z91199 Patient's noncompliance with other medical treatment and regimen due to unspecified reason: Secondary | ICD-10-CM

## 2012-07-05 DIAGNOSIS — E119 Type 2 diabetes mellitus without complications: Secondary | ICD-10-CM

## 2012-07-05 DIAGNOSIS — K1379 Other lesions of oral mucosa: Secondary | ICD-10-CM

## 2012-07-05 DIAGNOSIS — G4733 Obstructive sleep apnea (adult) (pediatric): Secondary | ICD-10-CM

## 2012-07-05 DIAGNOSIS — J351 Hypertrophy of tonsils: Secondary | ICD-10-CM | POA: Diagnosis present

## 2012-07-05 DIAGNOSIS — M948X9 Other specified disorders of cartilage, unspecified sites: Secondary | ICD-10-CM | POA: Insufficient documentation

## 2012-07-05 DIAGNOSIS — M25519 Pain in unspecified shoulder: Secondary | ICD-10-CM

## 2012-07-05 LAB — CBC WITH DIFFERENTIAL/PLATELET
Eosinophils Relative: 2 % (ref 0–5)
Hemoglobin: 13.8 g/dL (ref 13.0–17.0)
Lymphocytes Relative: 32 % (ref 12–46)
Lymphs Abs: 3.2 10*3/uL (ref 0.7–4.0)
MCV: 87.7 fL (ref 78.0–100.0)
Monocytes Relative: 8 % (ref 3–12)
Platelets: 244 10*3/uL (ref 150–400)
RBC: 4.73 MIL/uL (ref 4.22–5.81)
WBC: 9.9 10*3/uL (ref 4.0–10.5)

## 2012-07-05 LAB — HEMOGLOBIN A1C: Mean Plasma Glucose: 143 mg/dL — ABNORMAL HIGH (ref <117)

## 2012-07-05 LAB — GLUCOSE, CAPILLARY
Glucose-Capillary: 160 mg/dL — ABNORMAL HIGH (ref 70–99)
Glucose-Capillary: 234 mg/dL — ABNORMAL HIGH (ref 70–99)

## 2012-07-05 LAB — BASIC METABOLIC PANEL
BUN: 7 mg/dL (ref 6–23)
CO2: 29 mEq/L (ref 19–32)
Calcium: 9.1 mg/dL (ref 8.4–10.5)
Glucose, Bld: 147 mg/dL — ABNORMAL HIGH (ref 70–99)
Potassium: 3.6 mEq/L (ref 3.5–5.1)
Sodium: 135 mEq/L (ref 135–145)

## 2012-07-05 MED ORDER — SODIUM CHLORIDE 0.9 % IJ SOLN
3.0000 mL | Freq: Two times a day (BID) | INTRAMUSCULAR | Status: DC
  Administered 2012-07-05: 3 mL via INTRAVENOUS

## 2012-07-05 MED ORDER — POLYETHYLENE GLYCOL 3350 17 G PO PACK
17.0000 g | PACK | Freq: Every day | ORAL | Status: DC
  Administered 2012-07-05 – 2012-07-06 (×2): 17 g via ORAL
  Filled 2012-07-05 (×2): qty 1

## 2012-07-05 MED ORDER — MUPIROCIN 2 % EX OINT
1.0000 "application " | TOPICAL_OINTMENT | Freq: Two times a day (BID) | CUTANEOUS | Status: DC
  Administered 2012-07-05 – 2012-07-06 (×2): 1 via NASAL
  Filled 2012-07-05: qty 22

## 2012-07-05 MED ORDER — INSULIN ASPART 100 UNIT/ML ~~LOC~~ SOLN
0.0000 [IU] | Freq: Three times a day (TID) | SUBCUTANEOUS | Status: DC
  Administered 2012-07-05: 2 [IU] via SUBCUTANEOUS
  Administered 2012-07-05: 3 [IU] via SUBCUTANEOUS
  Administered 2012-07-06: 1 [IU] via SUBCUTANEOUS

## 2012-07-05 MED ORDER — DIPHENHYDRAMINE HCL 50 MG/ML IJ SOLN
50.0000 mg | Freq: Once | INTRAMUSCULAR | Status: AC
  Administered 2012-07-05: 50 mg via INTRAVENOUS
  Filled 2012-07-05: qty 1

## 2012-07-05 MED ORDER — HYDROCODONE-ACETAMINOPHEN 5-325 MG PO TABS: 1.0000 | ORAL_TABLET | ORAL | Status: DC | PRN

## 2012-07-05 MED ORDER — ONDANSETRON HCL 4 MG/2ML IJ SOLN: 4.0000 mg | Freq: Three times a day (TID) | INTRAMUSCULAR | Status: AC | PRN

## 2012-07-05 MED ORDER — ENOXAPARIN SODIUM 60 MG/0.6ML ~~LOC~~ SOLN
60.0000 mg | Freq: Every day | SUBCUTANEOUS | Status: DC
  Administered 2012-07-05: 60 mg via SUBCUTANEOUS
  Filled 2012-07-05 (×2): qty 0.6

## 2012-07-05 MED ORDER — ENOXAPARIN SODIUM 40 MG/0.4ML ~~LOC~~ SOLN
40.0000 mg | SUBCUTANEOUS | Status: DC
  Filled 2012-07-05: qty 0.4

## 2012-07-05 MED ORDER — FAMOTIDINE IN NACL 20-0.9 MG/50ML-% IV SOLN
20.0000 mg | Freq: Once | INTRAVENOUS | Status: AC
  Administered 2012-07-05: 20 mg via INTRAVENOUS
  Filled 2012-07-05: qty 50

## 2012-07-05 MED ORDER — PNEUMOCOCCAL VAC POLYVALENT 25 MCG/0.5ML IJ INJ
0.5000 mL | INJECTION | INTRAMUSCULAR | Status: AC
  Administered 2012-07-06: 0.5 mL via INTRAMUSCULAR
  Filled 2012-07-05 (×2): qty 0.5

## 2012-07-05 MED ORDER — OXYCODONE-ACETAMINOPHEN 5-325 MG PO TABS
1.0000 | ORAL_TABLET | Freq: Four times a day (QID) | ORAL | Status: DC | PRN
  Administered 2012-07-05 – 2012-07-06 (×4): 2 via ORAL
  Filled 2012-07-05 (×4): qty 2

## 2012-07-05 MED ORDER — CHLORHEXIDINE GLUCONATE CLOTH 2 % EX PADS: 6.0000 | MEDICATED_PAD | Freq: Every day | CUTANEOUS | Status: DC

## 2012-07-05 MED ORDER — LACTATED RINGERS IV SOLN
INTRAVENOUS | Status: AC
  Administered 2012-07-05: 11:00:00 via INTRAVENOUS

## 2012-07-05 MED ORDER — IOHEXOL 300 MG/ML  SOLN
100.0000 mL | Freq: Once | INTRAMUSCULAR | Status: AC | PRN
  Administered 2012-07-05: 100 mL via INTRAVENOUS

## 2012-07-05 MED ORDER — METHYLPREDNISOLONE SODIUM SUCC 125 MG IJ SOLR
125.0000 mg | Freq: Once | INTRAMUSCULAR | Status: AC
  Administered 2012-07-05: 125 mg via INTRAVENOUS
  Filled 2012-07-05: qty 2

## 2012-07-05 NOTE — ED Notes (Signed)
Patient transported to CT 

## 2012-07-05 NOTE — ED Notes (Signed)
Pt woke up in night and choking on tonsils.  Tonsils swollen/enlarged and blocking throat.  Pt also c/o rotator cuff pain to right.  Has been taking OTC meds for pain.

## 2012-07-05 NOTE — ED Notes (Signed)
Dr. Bonk at bedside 

## 2012-07-05 NOTE — Progress Notes (Signed)
Pt. Refusing to eat Carb. Modified diet. Pt requesting to eat regular diet bc that's "what I eat at home". Education done on importance of monitoring diet. Pt verbalized understanding, still requesting regular diet. MD paged on issue.

## 2012-07-05 NOTE — ED Notes (Signed)
Pt reports that he has been taking various OTC medications at home for pain. He has not been taking his prescribed meds as directed. He says he awoke feeling like he could not breath and swelling in his throat.

## 2012-07-05 NOTE — ED Provider Notes (Signed)
History     CSN: [id number]  Arrival date & time 07/05/12  4098   First MD Initiated Contact with Patient 07/05/12 0505      Chief Complaint  Patient presents with  . Oral Swelling   HPI Cody Nolan is a 52 y.o. male with a history of diabetes, tachycardia, hypertension and sleep apnea presents with swelling in his mouth. Patient says he woke up last night and felt she was choking on his "tonsils." He also complains about right rotator cuff pain for which she's been taking an increased amount of NSAIDs this week, this includes Aleve which she has not taken previously. Patient says at his job, he's also been doing more talking on the phone, for about 12 hours a day, for the last week, this is new he has not done this previously. He describes his symptoms as moderate to severe, ongoing, they have gotten better since he woke up this morning without intervention.   Past Medical History  Diagnosis Date  . Diabetes mellitus   . Hypertension   . Tachycardia   . Sleep apnea   . Sleep apnea     Past Surgical History  Procedure Laterality Date  . Cholecystectomy    . Knee  3 knee surgeries    History reviewed. No pertinent family history.  History  Substance Use Topics  . Smoking status: Former Smoker -- 0.75 packs/day for 10 years    Types: Cigarettes  . Smokeless tobacco: Never Used  . Alcohol Use: 0.0 oz/week     Comment: 2 cans of beer a month      Review of Systems At least 10pt or greater review of systems completed and are negative except where specified in the HPI.  Allergies  Penicillins and Sulfa antibiotics  Home Medications   Current Outpatient Rx  Name  Route  Sig  Dispense  Refill  . ALPRAZolam (XANAX) 1 MG tablet   Oral   Take 1 tablet (1 mg total) by mouth at bedtime as needed for sleep.   30 tablet   0   . glimepiride (AMARYL) 4 MG tablet   Oral   Take 1 tablet (4 mg total) by mouth daily.   30 tablet   2   . HYDROcodone-acetaminophen  (NORCO/VICODIN) 5-325 MG per tablet   Oral   Take 1 tablet by mouth every 4 (four) hours as needed for pain.   20 tablet   0   . metFORMIN (GLUCOPHAGE) 1000 MG tablet   Oral   Take 1,000 mg by mouth 2 (two) times daily with a meal.         . metoprolol tartrate (LOPRESSOR) 25 MG tablet   Oral   Take 50 mg by mouth at bedtime.           BP 131/96  Pulse 106  Temp(Src) 98.3 F (36.8 C) (Oral)  Resp 22  SpO2 97%  Physical Exam  Nursing notes reviewed.  Electronic medical record reviewed. VITAL SIGNS:   Filed Vitals:   07/05/12 0436 07/05/12 0509  BP: 136/111 131/96  Pulse: 112 106  Temp: 98.3 F (36.8 C)   TempSrc: Oral   Resp: 22 22  SpO2: 95% 97%   CONSTITUTIONAL: Awake, oriented, appears non-toxic HENT: Atraumatic, normocephalic, oral mucosa pink and moist. Patient uvula is edematous, there is a flap she appears to be fluid-filled protruding from the nasopharynx or base of the uvula. Airway is patent. Nares patent without drainage. External ears normal. EYES:  Conjunctiva clear, EOMI, PERRLA NECK: Trachea midline, non-tender, supple CARDIOVASCULAR: Normal heart rate, Normal rhythm, No murmurs, rubs, gallops PULMONARY/CHEST: Clear to auscultation, no rhonchi, wheezes, or rales. Symmetrical breath sounds. Non-tender. ABDOMINAL: Non-distended, soft, non-tender - no rebound or guarding.  BS normal. NEUROLOGIC: Non-focal, moving all four extremities, no gross sensory or motor deficits. EXTREMITIES: No clubbing, cyanosis, or edema SKIN: Warm, Dry, No erythema, No rash  ED Course  Procedures (including critical care time)  Labs Reviewed  GLUCOSE, CAPILLARY - Abnormal; Notable for the following:    Glucose-Capillary 160 (*)    All other components within normal limits  BASIC METABOLIC PANEL - Abnormal; Notable for the following:    Glucose, Bld 147 (*)    All other components within normal limits  CBC WITH DIFFERENTIAL   Ct Soft Tissue Neck W  Contrast  07/05/2012   *RADIOLOGY REPORT*  Clinical Data: Sore throat, choking, dysphagia, and pain.  Elevated white blood count.  CT NECK WITH CONTRAST  Technique:  Multidetector CT imaging of the neck was performed with intravenous contrast.  Contrast: OMNIPAQUE IOHEXOL 300 MG/ML  SOLN  Comparison: None  Findings: There is slight prominence of the lingual tonsils bilaterally.  There is no peritonsillar abscess, mass, or adenopathy.  There is no opacification of the middle ear cavities or mastoid air cells.  The visualized paranasal sinuses are clear. Facial bones are normal.  Thyroid gland is slightly asymmetric but normal.  No prevertebral soft tissue swelling.  The valleculae and piriform sinuses are normal.  Epiglottis appears normal.  Level of the vocal cords appears normal.  IMPRESSION: Prominent lingual tonsils.  Otherwise, normal exam.   Original Report Authenticated By: Francene Boyers, M.D.     1. Uvular swelling   2. Non compliance w medication regimen   3. OSA (obstructive sleep apnea)       MDM  Cody Nolan is a 52 y.o. male presents with isolated uvular swelling. Patient has somewhat of a muffled voice, no fever, no redness in the throat to suggest retropharyngeal abscess or peritonsillar abscess. Patient is maintaining his secretions, his airway is patent however he does have some significant swelling of the uvula and behind the uvula. Discussed this case with Dr. Jenne Pane from ENT. He will evaluate this patient in the emergency department. Discussed patient with hospitalist for admission.  CT of the patient's neck shows prominent lingual tonsils but otherwise patent airway-I. do not think intubation is indicated at this time. Labs are otherwise unremarkable.: Admit The patient for observation         Jones Skene, MD 07/05/12 [phone]

## 2012-07-05 NOTE — H&P (Signed)
[facility] History and Physical  SABRE LEONETTI WUJ:[id number] DOB: 21-Jul-1960 DOA: 07/05/2012  Referring physician: Dr. Rulon Abide PCP: Burtis Junes, MD  Specialists: ENT  Chief Complaint: throat swelling  HPI: Cody Nolan is a 52 y.o. male has a past medical history significant for DM, HTN, HLD, OSA, chronic pain presents with a CC of throat swelling when he woke up this morning. No fever/chills, denies chest pain or breathing difficulties. Has had an episode like this in the past few years ago. Has a history of chronic knee and shoulder pain which have gotten worse recently and has been taking more pain medications including Aleve which is new for him.  Does not follow up on a regular basis with his PCP; ran out of all his medications and has not taken any in the past 2 weeks. In the ED patient underwent a CT neck which showed prominent lingual tonsils; ENT has been consulted by EDP and [facility]ad was asked for admission.   Review of Systems: as per HPI, otherwise negative.   Past Medical History  Diagnosis Date  . Diabetes mellitus   . Hypertension   . Tachycardia   . Sleep apnea   . Sleep apnea    Past Surgical History  Procedure Laterality Date  . Cholecystectomy    . Knee  3 knee surgeries   Social History:  reports that he has quit smoking. His smoking use included Cigarettes. He has a 7.5 pack-year smoking history. He has never used smokeless tobacco. He reports that  drinks alcohol. He reports that he does not use illicit drugs.  Allergies  Allergen Reactions  . Penicillins     Unknown reaction-childhood  . Sulfa Antibiotics Other (See Comments)    Unknown childhood reaction    History reviewed. No pertinent family history.  Prior to Admission medications   Medication Sig Start Date End Date Taking? Authorizing Provider  ALPRAZolam Prudy Feeler) 1 MG tablet Take 1 tablet (1 mg total) by mouth at bedtime as needed for sleep. 03/18/12   Flint Melter, MD   glimepiride (AMARYL) 4 MG tablet Take 1 tablet (4 mg total) by mouth daily. 01/19/12   Tiffany Irine Seal, PA-C  HYDROcodone-acetaminophen (NORCO/VICODIN) 5-325 MG per tablet Take 1 tablet by mouth every 4 (four) hours as needed for pain. 03/18/12   Flint Melter, MD  metFORMIN (GLUCOPHAGE) 1000 MG tablet Take 1,000 mg by mouth 2 (two) times daily with a meal. 01/19/12   Dorthula Matas, PA-C  metoprolol tartrate (LOPRESSOR) 25 MG tablet Take 50 mg by mouth at bedtime. 01/19/12   Dorthula Matas, PA-C   Physical Exam: Filed Vitals:   07/05/12 0831 07/05/12 1000 07/05/12 1050 07/05/12 1100  BP: 120/60   152/104  Pulse: 103 93 96 94  Temp: 98 F (36.7 C)     TempSrc:      Resp: 20 17 40 33  Height:  5' 7.5" (1.715 m)    Weight:  116.8 kg (257 lb 8 oz)    SpO2: 98% 99% 97% 97%     General:  No apparent distress  Eyes: PERRL, EOMI, no scleral icterus  ENT: moist oropharynx, uvular swelling  Neck: supple, no JVD  Cardiovascular: regular rate without MRG; 2+ peripheral pulses  Respiratory: CTA biL, good air movement without wheezing, rhonchi or crackled  Abdomen: soft, non tender to palpation, positive bowel sounds, no guarding, no rebound  Skin: no rashes  Musculoskeletal: no peripheral edema  Psychiatric: normal mood and  affect  Neurologic: CN 2-12 grossly intact, MS 5/5 in all 4  Labs on Admission:  Basic Metabolic Panel:  Recent Labs Lab 07/05/12 0517  NA 135  K 3.6  CL 97  CO2 29  GLUCOSE 147*  BUN 7  CREATININE 0.72  CALCIUM 9.1   CBC:  Recent Labs Lab 07/05/12 0517  WBC 9.9  NEUTROABS 5.8  HGB 13.8  HCT 41.5  MCV 87.7  PLT 244   CBG:  Recent Labs Lab 07/05/12 0440  GLUCAP 160*   Radiological Exams on Admission: Dg Shoulder Right  07/05/2012   *RADIOLOGY REPORT*  Clinical Data: History of chronic right shoulder pain.  History of previous rotator cuff injury.  RIGHT SHOULDER - 2+ VIEW  Comparison: None.  Findings: There is slight  narrowing of the AC joint space.  There is some sclerosis of the superior lateral aspect of the humeral head.  Contour of the humeral head suggests previous dislocation with Hill-Sachs configuration.  There is no evidence of acute fracture.  No bony destruction is evident.  No calcific bursitis, calcific tendonitis, or cervical rib is evident.  IMPRESSION: Slight narrowing of the AC joint space.  Contour of the humeral head suggests previous dislocation with Tamera Reason configuration.  No acute process evident.   Original Report Authenticated By: Onalee Hua Call   Ct Soft Tissue Neck W Contrast  07/05/2012   *RADIOLOGY REPORT*  Clinical Data: Sore throat, choking, dysphagia, and pain.  Elevated white blood count.  CT NECK WITH CONTRAST  Technique:  Multidetector CT imaging of the neck was performed with intravenous contrast.  Contrast: OMNIPAQUE IOHEXOL 300 MG/ML  SOLN  Comparison: None  Findings: There is slight prominence of the lingual tonsils bilaterally.  There is no peritonsillar abscess, mass, or adenopathy.  There is no opacification of the middle ear cavities or mastoid air cells.  The visualized paranasal sinuses are clear. Facial bones are normal.  Thyroid gland is slightly asymmetric but normal.  No prevertebral soft tissue swelling.  The valleculae and piriform sinuses are normal.  Epiglottis appears normal.  Level of the vocal cords appears normal.  IMPRESSION: Prominent lingual tonsils.  Otherwise, normal exam.   Original Report Authenticated By: Francene Boyers, M.D.    EKG: Independently reviewed.  Assessment/Plan Active Problems:   DM   HYPERLIPIDEMIA   Essential hypertension, benign   KNEE PAIN, RIGHT   Shoulder pain   Swollen tonsil   Uvular swelling - appreciate ENT consult. Monitoring today in SDU. Once improving, transfer to floor.  - no respiratory problems, no stridor  DM - HBA1C 6.6 showing control - SSI  HTN - start antihypertensives as indicated. Normal BP in the  ED  Shoulder pain - DG XR - needs outpatient f/u with his orthopedics  History of 3 sec pause on telemetry last year - likely due to metoprolol - monitor on telemetry here.   DVT prophylaxis - Lovenox  Code Status: Presumed full  Family Communication: none  Disposition Plan: Obs/SDU  Time spent: 42  Costin M. Elvera Lennox, MD [facility] Pager [phone]  If 7PM-7AM, please contact night-coverage [email or web address] Password [email or web address] 07/05/2012, 1:31 PM

## 2012-07-05 NOTE — Consult Note (Signed)
Reason for Consult:Swelling of uvula Referring Physician: Eddie North, MD  Cody Nolan is an 52 y.o. male.  HPI: He was awakened her this morning with a sensation of swelling in his throat. He coughed up his uvula and it was stuck on top of his tongue for a while before it went down. This was early this morning, now feels about 50% better. He had something similar to this a few years ago. He has a long history of reflux. He said many times each day. He smokes one was to pack each day and drinks 3-4 cancer [facility] daily. He also had a drink of alcohol last night which is unusual for him.  Past Medical History  Diagnosis Date  . Diabetes mellitus   . Hypertension   . Tachycardia   . Sleep apnea   . Sleep apnea     Past Surgical History  Procedure Laterality Date  . Cholecystectomy    . Knee  3 knee surgeries    History reviewed. No pertinent family history.  Social History:  reports that he has quit smoking. His smoking use included Cigarettes. He has a 7.5 pack-year smoking history. He has never used smokeless tobacco. He reports that  drinks alcohol. He reports that he does not use illicit drugs.  Allergies:  Allergies  Allergen Reactions  . Penicillins     Unknown reaction-childhood  . Sulfa Antibiotics Other (See Comments)    Unknown childhood reaction    Medications: Reviewed  Results for orders placed during the hospital encounter of 07/05/12 (from the past 48 hour(s))  GLUCOSE, CAPILLARY     Status: Abnormal   Collection Time    07/05/12  4:40 AM      Result Value Range   Glucose-Capillary 160 (*) 70 - 99 mg/dL   Comment 1 Documented in Chart     Comment 2 Notify RN    CBC WITH DIFFERENTIAL     Status: None   Collection Time    07/05/12  5:17 AM      Result Value Range   WBC 9.9  4.0 - 10.5 K/uL   RBC 4.73  4.22 - 5.81 MIL/uL   Hemoglobin 13.8  13.0 - 17.0 g/dL   HCT 16.1  09.6 - 04.5 %   MCV 87.7  78.0 - 100.0 fL   MCH 29.2  26.0 - 34.0 pg   MCHC 33.3  30.0 - 36.0 g/dL   RDW 40.9  81.1 - 91.4 %   Platelets 244  150 - 400 K/uL   Neutrophils Relative % 58  43 - 77 %   Neutro Abs 5.8  1.7 - 7.7 K/uL   Lymphocytes Relative 32  12 - 46 %   Lymphs Abs 3.2  0.7 - 4.0 K/uL   Monocytes Relative 8  3 - 12 %   Monocytes Absolute 0.8  0.1 - 1.0 K/uL   Eosinophils Relative 2  0 - 5 %   Eosinophils Absolute 0.2  0.0 - 0.7 K/uL   Basophils Relative 0  0 - 1 %   Basophils Absolute 0.0  0.0 - 0.1 K/uL  BASIC METABOLIC PANEL     Status: Abnormal   Collection Time    07/05/12  5:17 AM      Result Value Range   Sodium 135  135 - 145 mEq/L   Potassium 3.6  3.5 - 5.1 mEq/L   Chloride 97  96 - 112 mEq/L   CO2 29  19 -  32 mEq/L   Glucose, Bld 147 (*) 70 - 99 mg/dL   BUN 7  6 - 23 mg/dL   Creatinine, Ser 7.82  0.50 - 1.35 mg/dL   Calcium 9.1  8.4 - 95.6 mg/dL   GFR calc non Af Amer >90  >90 mL/min   GFR calc Af Amer >90  >90 mL/min   Comment:            The eGFR has been calculated     using the CKD EPI equation.     This calculation has not been     validated in all clinical     situations.     eGFR's persistently     <90 mL/min signify     possible Chronic Kidney Disease.    Dg Shoulder Right  07/05/2012   *RADIOLOGY REPORT*  Clinical Data: History of chronic right shoulder pain.  History of previous rotator cuff injury.  RIGHT SHOULDER - 2+ VIEW  Comparison: None.  Findings: There is slight narrowing of the AC joint space.  There is some sclerosis of the superior lateral aspect of the humeral head.  Contour of the humeral head suggests previous dislocation with Hill-Sachs configuration.  There is no evidence of acute fracture.  No bony destruction is evident.  No calcific bursitis, calcific tendonitis, or cervical rib is evident.  IMPRESSION: Slight narrowing of the AC joint space.  Contour of the humeral head suggests previous dislocation with Tamera Reason configuration.  No acute process evident.   Original Report Authenticated By:  Onalee Hua Call   Ct Soft Tissue Neck W Contrast  07/05/2012   *RADIOLOGY REPORT*  Clinical Data: Sore throat, choking, dysphagia, and pain.  Elevated white blood count.  CT NECK WITH CONTRAST  Technique:  Multidetector CT imaging of the neck was performed with intravenous contrast.  Contrast: OMNIPAQUE IOHEXOL 300 MG/ML  SOLN  Comparison: None  Findings: There is slight prominence of the lingual tonsils bilaterally.  There is no peritonsillar abscess, mass, or adenopathy.  There is no opacification of the middle ear cavities or mastoid air cells.  The visualized paranasal sinuses are clear. Facial bones are normal.  Thyroid gland is slightly asymmetric but normal.  No prevertebral soft tissue swelling.  The valleculae and piriform sinuses are normal.  Epiglottis appears normal.  Level of the vocal cords appears normal.  IMPRESSION: Prominent lingual tonsils.  Otherwise, normal exam.   Original Report Authenticated By: Francene Boyers, M.D.    OZH:[id number] except as listed in admit H&P  Blood pressure 120/60, pulse 93, temperature 98 F (36.7 C), temperature source Oral, resp. rate 17, height 5' 7.5" (1.715 m), weight 257 lb 8 oz (116.8 kg), SpO2 99.00%.  PHYSICAL EXAM: Overall appearance:  Healthy appearing, in no distress Head:  Normocephalic, atraumatic. Ears: External ears normal. Nose: External nose is healthy in appearance. Internal nasal exam free of any lesions or obstruction. Oral Cavity:  There are no mucosal lesions or masses identified. The uvula has pale edema. There is no airway obstruction. There is no stridor. He has no trouble handling secretions. Oral Pharynx/Hypopharynx/Larynx: no signs of any mucosal lesions or masses identified.  Neuro:  No identifiable neurologic deficits. Neck: No palpable neck masses.  Studies Reviewed: none  Procedures: none   Assessment/Plan: Uvular swelling. This could be related to [facility] which he took for the first time. This could be an allergic  reaction and I cautioned him about all the NSAIDs since there can be cross-reactivity. He has  been taking a lot of ibuprofen recently for pain so he doesn't seem to have a sensitivity to that. The other thing that can cause this type of swelling is reflux. He clearly has severe reflux. We discussed causes and recommend strongly that he stop smoking and avoid all caffeine and alcohol. Contact me again if there is any worsening of her swelling. As far as his airway is concerned, it is okay to feed him a diet.  Riyanna Crutchley 07/05/2012, 10:42 AM

## 2012-07-06 DIAGNOSIS — M25569 Pain in unspecified knee: Secondary | ICD-10-CM

## 2012-07-06 LAB — GLUCOSE, CAPILLARY

## 2012-07-06 MED ORDER — GLIMEPIRIDE 4 MG PO TABS: 4.0000 mg | ORAL_TABLET | Freq: Every day | ORAL | Status: DC

## 2012-07-06 MED ORDER — INSULIN GLARGINE 100 UNIT/ML ~~LOC~~ SOLN: 30.0000 [IU] | Freq: Every day | SUBCUTANEOUS | Status: DC

## 2012-07-06 MED ORDER — ALBUTEROL SULFATE HFA 108 (90 BASE) MCG/ACT IN AERS: 2.0000 | INHALATION_SPRAY | Freq: Four times a day (QID) | RESPIRATORY_TRACT | Status: DC | PRN

## 2012-07-06 MED ORDER — METFORMIN HCL 1000 MG PO TABS: 1000.0000 mg | ORAL_TABLET | Freq: Two times a day (BID) | ORAL | Status: DC

## 2012-07-06 MED ORDER — ALPRAZOLAM 1 MG PO TABS: 1.0000 mg | ORAL_TABLET | Freq: Every evening | ORAL | Status: DC | PRN

## 2012-07-06 MED ORDER — OXYCODONE-ACETAMINOPHEN 5-325 MG PO TABS: 1.0000 | ORAL_TABLET | Freq: Four times a day (QID) | ORAL | Status: DC | PRN

## 2012-07-06 MED ORDER — METOPROLOL TARTRATE 25 MG PO TABS: 25.0000 mg | ORAL_TABLET | Freq: Every day | ORAL | Status: DC

## 2012-07-06 MED ORDER — TIZANIDINE HCL 4 MG PO CAPS: 4.0000 mg | ORAL_CAPSULE | Freq: Three times a day (TID) | ORAL | Status: DC | PRN

## 2012-07-06 MED ORDER — OMEPRAZOLE 40 MG PO CPDR: 40.0000 mg | DELAYED_RELEASE_CAPSULE | Freq: Every day | ORAL | Status: DC

## 2012-07-06 NOTE — Discharge Summary (Signed)
Physician Discharge Summary  Cody Nolan WJX:[id number] DOB: 05-09-1960 DOA: 07/05/2012  PCP: Burtis Junes, MD  Admit date: 07/05/2012 Discharge date: 07/06/2012  Time spent: 45 minutes  Recommendations for Outpatient Follow-up:  1. Please follow up with PCP next week.   Discharge Diagnoses:  Active Problems:   DM   HYPERLIPIDEMIA   Essential hypertension, benign   KNEE PAIN, RIGHT   Shoulder pain   Swollen tonsil  Discharge Condition: stable  Diet recommendation: diabetic.  Filed Weights   07/05/12 1000  Weight: 116.8 kg (257 lb 8 oz)   History of present illness:  Cody Nolan is a 52 y.o. male has a past medical history significant for DM, HTN, HLD, OSA, chronic pain presents with a CC of throat swelling when he woke up this morning. No fever/chills, denies chest pain or breathing difficulties. Has had an episode like this in the past few years ago. Has a history of chronic knee and shoulder pain which have gotten worse recently and has been taking more pain medications including Aleve which is new for him.  Does not follow up on a regular basis with his PCP; ran out of all his medications and has not taken any in the past 2 weeks. In the ED patient underwent a CT neck which showed prominent lingual tonsils; ENT has been consulted by EDP and [facility] was asked for admission.   Hospital Course:  Uvular swelling - appreciated ENT consult, thought to may represent an allergic reaction. His swelling completely resolved the morning of admission and patient was breathing comfortable and able to tolerate a regular diet. Counseled to avoid Aleve which might have triggered it. Aleve added to his allergy list.  DM - HBA1C 6.6 showing control. Patient is currently taking Lantus 30 U at home which is not on medication list and is on Metformin and Amaryl.  HTN - no changed to his home regimen.  Shoulder pain - DG XR without acute findings. Likely needs outpatient f/u with his  orthopedics, he will discuss that with his PCP.  History of 3 sec pause on telemetry last year - likely due to metoprolol, his dose was changed last year from 50 to 66. No events here.  OSA - counseled to discuss with PCP about sleep study and appropriate referral.   Procedures:  none   Consultations:  ENT  Discharge Exam: Filed Vitals:   07/06/12 0612 07/06/12 0800 07/06/12 0812 07/06/12 0900  BP: 148/103 136/64    Pulse: 98 108  87  Temp:   98.7 F (37.1 C)   TempSrc:   Oral   Resp: 16 24  21   Height:      Weight:      SpO2: 99% 100%  96%    General: NAD Cardiovascular: RRR Respiratory: CTA biL HEENT: moist oropharynx, no swelling appreciated, uvula normal.  Discharge Instructions     Medication List    STOP taking these medications       HYDROcodone-acetaminophen 5-325 MG per tablet  Commonly known as:  NORCO/VICODIN      TAKE these medications       albuterol 108 (90 BASE) MCG/ACT inhaler  Commonly known as:  PROVENTIL HFA;VENTOLIN HFA  Inhale 2 puffs into the lungs every 6 (six) hours as needed for wheezing.     ALPRAZolam 1 MG tablet  Commonly known as:  XANAX  Take 1 tablet (1 mg total) by mouth at bedtime as needed for sleep.     glimepiride 4  MG tablet  Commonly known as:  AMARYL  Take 1 tablet (4 mg total) by mouth daily.     insulin glargine 100 UNIT/ML injection  Commonly known as:  LANTUS  Inject 0.3 mLs (30 Units total) into the skin at bedtime.     metFORMIN 1000 MG tablet  Commonly known as:  GLUCOPHAGE  Take 1 tablet (1,000 mg total) by mouth 2 (two) times daily with a meal.     metoprolol tartrate 25 MG tablet  Commonly known as:  LOPRESSOR  Take 1 tablet (25 mg total) by mouth at bedtime.     omeprazole 40 MG capsule  Commonly known as:  PRILOSEC  Take 1 capsule (40 mg total) by mouth daily.     oxyCODONE-acetaminophen 5-325 MG per tablet  Commonly known as:  PERCOCET/ROXICET  Take 1-2 tablets by mouth every 6 (six) hours  as needed.     tiZANidine 4 MG capsule  Commonly known as:  ZANAFLEX  Take 1 capsule (4 mg total) by mouth 3 (three) times daily as needed for muscle spasms.         The results of significant diagnostics from this hospitalization (including imaging, microbiology, ancillary and laboratory) are listed below for reference.    Significant Diagnostic Studies: Dg Shoulder Right  07/05/2012   *RADIOLOGY REPORT*  Clinical Data: History of chronic right shoulder pain.  History of previous rotator cuff injury.  RIGHT SHOULDER - 2+ VIEW  Comparison: None.  Findings: There is slight narrowing of the AC joint space.  There is some sclerosis of the superior lateral aspect of the humeral head.  Contour of the humeral head suggests previous dislocation with Hill-Sachs configuration.  There is no evidence of acute fracture.  No bony destruction is evident.  No calcific bursitis, calcific tendonitis, or cervical rib is evident.  IMPRESSION: Slight narrowing of the AC joint space.  Contour of the humeral head suggests previous dislocation with Tamera Reason configuration.  No acute process evident.   Original Report Authenticated By: Onalee Hua Call   Ct Soft Tissue Neck W Contrast  07/05/2012   *RADIOLOGY REPORT*  Clinical Data: Sore throat, choking, dysphagia, and pain.  Elevated white blood count.  CT NECK WITH CONTRAST  Technique:  Multidetector CT imaging of the neck was performed with intravenous contrast.  Contrast: OMNIPAQUE IOHEXOL 300 MG/ML  SOLN  Comparison: None  Findings: There is slight prominence of the lingual tonsils bilaterally.  There is no peritonsillar abscess, mass, or adenopathy.  There is no opacification of the middle ear cavities or mastoid air cells.  The visualized paranasal sinuses are clear. Facial bones are normal.  Thyroid gland is slightly asymmetric but normal.  No prevertebral soft tissue swelling.  The valleculae and piriform sinuses are normal.  Epiglottis appears normal.  Level of  the vocal cords appears normal.  IMPRESSION: Prominent lingual tonsils.  Otherwise, normal exam.   Original Report Authenticated By: Francene Boyers, M.D.    Microbiology: Recent Results (from the past 240 hour(s))  MRSA PCR SCREENING     Status: Abnormal   Collection Time    07/05/12  1:00 PM      Result Value Range Status   MRSA by PCR POSITIVE (*) NEGATIVE Final   Comment:            The GeneXpert MRSA Assay (FDA     approved for NASAL specimens     only), is one component of a     comprehensive MRSA colonization  surveillance program. It is not     intended to diagnose MRSA     infection nor to guide or     monitor treatment for     MRSA infections.     RESULT CALLED TO, READ BACK BY AND VERIFIED WITH:     [id number]     Labs: Basic Metabolic Panel:  Recent Labs Lab 07/05/12 0517  NA 135  K 3.6  CL 97  CO2 29  GLUCOSE 147*  BUN 7  CREATININE 0.72  CALCIUM 9.1   CBC:  Recent Labs Lab 07/05/12 0517  WBC 9.9  NEUTROABS 5.8  HGB 13.8  HCT 41.5  MCV 87.7  PLT 244   CBG:  Recent Labs Lab 07/05/12 0440 07/05/12 1155 07/05/12 1651 07/05/12 2220 07/06/12 0727  GLUCAP 160* 162* 234* 168* 136*       Signed:  GHERGHE, COSTIN  [facility] 07/06/2012, 10:37 AM

## 2012-07-06 NOTE — Progress Notes (Signed)
Cody Nolan 1222 D/C HOME- TO LOBBY WITH NT IN W/CHAIR. HE WAS GIVEN D/C INSTRUCTIONS. HE SAID ENT MD HAD BEEN BY ALREADY- HE DID NOT WANT TO WAIT ON DM STAFF TO TALK TO HIM. HE D/C'D HIS TELE. AND GOT DRESSED AND WAS LEAVING. HE HAD FREQUENT DEMANDS. TALKED NON-STOP AND WAS NOT GOING TO WAIT ABOUT LEAVING.

## 2012-07-17 ENCOUNTER — Encounter (HOSPITAL_COMMUNITY): Payer: Self-pay

## 2012-07-17 ENCOUNTER — Emergency Department (HOSPITAL_COMMUNITY)
Admission: EM | Admit: 2012-07-17 | Discharge: 2012-07-17 | Disposition: A | Discharge status: Home / Self Care | Payer: BC Managed Care – PPO | Attending: Emergency Medicine | Admitting: Emergency Medicine

## 2012-07-17 DIAGNOSIS — E119 Type 2 diabetes mellitus without complications: Secondary | ICD-10-CM | POA: Insufficient documentation

## 2012-07-17 DIAGNOSIS — M79609 Pain in unspecified limb: Secondary | ICD-10-CM | POA: Insufficient documentation

## 2012-07-17 DIAGNOSIS — R350 Frequency of micturition: Secondary | ICD-10-CM | POA: Insufficient documentation

## 2012-07-17 DIAGNOSIS — I10 Essential (primary) hypertension: Secondary | ICD-10-CM | POA: Insufficient documentation

## 2012-07-17 DIAGNOSIS — Z87891 Personal history of nicotine dependence: Secondary | ICD-10-CM | POA: Insufficient documentation

## 2012-07-17 DIAGNOSIS — G4730 Sleep apnea, unspecified: Secondary | ICD-10-CM | POA: Insufficient documentation

## 2012-07-17 DIAGNOSIS — M25511 Pain in right shoulder: Secondary | ICD-10-CM

## 2012-07-17 DIAGNOSIS — Z79899 Other long term (current) drug therapy: Secondary | ICD-10-CM | POA: Insufficient documentation

## 2012-07-17 DIAGNOSIS — G8929 Other chronic pain: Secondary | ICD-10-CM | POA: Insufficient documentation

## 2012-07-17 DIAGNOSIS — G589 Mononeuropathy, unspecified: Secondary | ICD-10-CM | POA: Insufficient documentation

## 2012-07-17 DIAGNOSIS — Z794 Long term (current) use of insulin: Secondary | ICD-10-CM | POA: Insufficient documentation

## 2012-07-17 DIAGNOSIS — M25519 Pain in unspecified shoulder: Secondary | ICD-10-CM | POA: Insufficient documentation

## 2012-07-17 LAB — GLUCOSE, CAPILLARY: Glucose-Capillary: 127 mg/dL — ABNORMAL HIGH (ref 70–99)

## 2012-07-17 LAB — URINALYSIS, ROUTINE W REFLEX MICROSCOPIC
Hgb urine dipstick: NEGATIVE
Leukocytes, UA: NEGATIVE
Specific Gravity, Urine: 1.035 — ABNORMAL HIGH (ref 1.005–1.030)
Urobilinogen, UA: 0.2 mg/dL (ref 0.0–1.0)

## 2012-07-17 LAB — COMPREHENSIVE METABOLIC PANEL
AST: 17 U/L (ref 0–37)
Albumin: 3.5 g/dL (ref 3.5–5.2)
Calcium: 9.4 mg/dL (ref 8.4–10.5)
Creatinine, Ser: 0.76 mg/dL (ref 0.50–1.35)
GFR calc non Af Amer: >90 mL/min (ref >90)
Potassium: 4.1 mEq/L (ref 3.5–5.1)
Sodium: 139 mEq/L (ref 135–145)

## 2012-07-17 LAB — CBC WITH DIFFERENTIAL/PLATELET
Eosinophils Absolute: 0.3 10*3/uL (ref 0.0–0.7)
Hemoglobin: 13.4 g/dL (ref 13.0–17.0)
Lymphocytes Relative: 32 % (ref 12–46)
Lymphs Abs: 4.4 10*3/uL — ABNORMAL HIGH (ref 0.7–4.0)
MCH: 28.8 pg (ref 26.0–34.0)
Monocytes Relative: 7 % (ref 3–12)
Neutro Abs: 8 10*3/uL — ABNORMAL HIGH (ref 1.7–7.7)
Neutrophils Relative %: 58 % (ref 43–77)
Platelets: 266 10*3/uL (ref 150–400)
RBC: 4.65 MIL/uL (ref 4.22–5.81)
WBC: 13.8 10*3/uL — ABNORMAL HIGH (ref 4.0–10.5)

## 2012-07-17 MED ORDER — TIZANIDINE HCL 4 MG PO CAPS: 4.0000 mg | ORAL_CAPSULE | Freq: Three times a day (TID) | ORAL | Status: DC | PRN

## 2012-07-17 MED ORDER — OXYCODONE-ACETAMINOPHEN 5-325 MG PO TABS: 2.0000 | ORAL_TABLET | ORAL | Status: DC | PRN

## 2012-07-17 NOTE — ED Provider Notes (Signed)
History     CSN: [id number]  Arrival date & time 07/17/12  4098   First MD Initiated Contact with Patient 07/17/12 2130      Chief Complaint  Patient presents with  . Urinary Frequency  . Shoulder Pain  . Foot Pain  . Dizziness    (Consider location/radiation/quality/duration/timing/severity/associated sxs/prior treatment) Patient is a 52 y.o. male presenting with frequency, shoulder pain, and lower extremity pain. The history is provided by the patient.  Urinary Frequency  Shoulder Pain  Foot Pain  pt here c/o worsening right shoulder pain and neuropathy to his le--recently admitted for angioedema and was rx percocet #50--denies and new trauma to his shoulder--pain is sharp and worse with movement--percocet and zanaflex allevited his sx--pt did complain or urinary frequency but he attributes that to his increased intake of sweet tea  Past Medical History  Diagnosis Date  . Diabetes mellitus   . Hypertension   . Tachycardia   . Sleep apnea   . Sleep apnea     Past Surgical History  Procedure Laterality Date  . Cholecystectomy    . Knee  3 knee surgeries    No family history on file.  History  Substance Use Topics  . Smoking status: Former Smoker -- 0.75 packs/day for 10 years    Types: Cigarettes  . Smokeless tobacco: Never Used  . Alcohol Use: 0.0 oz/week     Comment: 2 cans of beer a month      Review of Systems  Genitourinary: Positive for frequency.  All other systems reviewed and are negative.    Allergies  Aleve; Penicillins; and Sulfa antibiotics  Home Medications   Current Outpatient Rx  Name  Route  Sig  Dispense  Refill  . albuterol (PROVENTIL HFA;VENTOLIN HFA) 108 (90 BASE) MCG/ACT inhaler   Inhalation   Inhale 2 puffs into the lungs every 6 (six) hours as needed for wheezing.   1 Inhaler   2   . ALPRAZolam (XANAX) 1 MG tablet   Oral   Take 1 tablet (1 mg total) by mouth at bedtime as needed for sleep.   30 tablet   0   .  glimepiride (AMARYL) 4 MG tablet   Oral   Take 1 tablet (4 mg total) by mouth daily.   30 tablet   2   . insulin glargine (LANTUS) 100 UNIT/ML injection   Subcutaneous   Inject 0.3 mLs (30 Units total) into the skin at bedtime.   10 mL   12   . metFORMIN (GLUCOPHAGE) 1000 MG tablet   Oral   Take 1 tablet (1,000 mg total) by mouth 2 (two) times daily with a meal.   60 tablet   0   . metoprolol tartrate (LOPRESSOR) 25 MG tablet   Oral   Take 1 tablet (25 mg total) by mouth at bedtime.   30 tablet   0   . omeprazole (PRILOSEC) 40 MG capsule   Oral   Take 1 capsule (40 mg total) by mouth daily.   60 capsule   0   . oxyCODONE-acetaminophen (PERCOCET/ROXICET) 5-325 MG per tablet   Oral   Take 1-2 tablets by mouth every 6 (six) hours as needed.   50 tablet   0   . tiZANidine (ZANAFLEX) 4 MG capsule   Oral   Take 1 capsule (4 mg total) by mouth 3 (three) times daily as needed for muscle spasms.   30 capsule   0     BP  130/89  Pulse 90  Temp(Src) 97.8 F (36.6 C) (Oral)  Resp 20  SpO2 97%  Physical Exam  Nursing note and vitals reviewed. Constitutional: He is oriented to person, place, and time. He appears well-developed and well-nourished.  Non-toxic appearance. No distress.  HENT:  Head: Normocephalic and atraumatic.  Eyes: Conjunctivae, EOM and lids are normal. Pupils are equal, round, and reactive to light.  Neck: Normal range of motion. Neck supple. No tracheal deviation present. No mass present.  Cardiovascular: Normal rate, regular rhythm and normal heart sounds.  Exam reveals no gallop.   No murmur heard. Pulmonary/Chest: Effort normal and breath sounds normal. No stridor. No respiratory distress. He has no decreased breath sounds. He has no wheezes. He has no rhonchi. He has no rales.  Abdominal: Soft. Normal appearance and bowel sounds are normal. He exhibits no distension. There is no tenderness. There is no rebound and no CVA tenderness.    Musculoskeletal: Normal range of motion. He exhibits no edema and no tenderness.       Arms: Neurological: He is alert and oriented to person, place, and time. He has normal strength. No cranial nerve deficit or sensory deficit. GCS eye subscore is 4. GCS verbal subscore is 5. GCS motor subscore is 6.  Skin: Skin is warm and dry. No abrasion and no rash noted.  Psychiatric: He has a normal mood and affect. His speech is normal and behavior is normal.    ED Course  Procedures (including critical care time)  Labs Reviewed  GLUCOSE, CAPILLARY - Abnormal; Notable for the following:    Glucose-Capillary 127 (*)    All other components within normal limits  CBC WITH DIFFERENTIAL - Abnormal; Notable for the following:    WBC 13.8 (*)    Neutro Abs 8.0 (*)    Lymphs Abs 4.4 (*)    All other components within normal limits  COMPREHENSIVE METABOLIC PANEL - Abnormal; Notable for the following:    Glucose, Bld 120 (*)    Total Bilirubin 0.2 (*)    All other components within normal limits  URINALYSIS, ROUTINE W REFLEX MICROSCOPIC - Abnormal; Notable for the following:    Specific Gravity, Urine 1.035 (*)    Bilirubin Urine SMALL (*)    All other components within normal limits   No results found.   No diagnosis found.    MDM  Pt with likely bursitis of his right shoulder and chronic pain--will refill meds and he is stable for d/c        Toy Baker, MD 07/17/12 2214

## 2012-07-17 NOTE — ED Notes (Signed)
Pt has multiple complaints, states has been out of meds x42month; pt c/o rt shoulder pain, rt foot pain, urinary frequency, dizziness, "feels like my sugar is high, I haven't checked it in a month"; pt a/o x4, no distress noted.

## 2012-07-19 ENCOUNTER — Ambulatory Visit (INDEPENDENT_AMBULATORY_CARE_PROVIDER_SITE_OTHER): Payer: BC Managed Care – PPO | Admitting: Family Medicine

## 2012-07-19 VITALS — BP 128/68 | HR 126 | Temp 98.2°F | Resp 18 | Ht 70.0 in | Wt 250.8 lb

## 2012-07-19 DIAGNOSIS — M549 Dorsalgia, unspecified: Secondary | ICD-10-CM

## 2012-07-19 DIAGNOSIS — M7521 Bicipital tendinitis, right shoulder: Secondary | ICD-10-CM

## 2012-07-19 DIAGNOSIS — F308 Other manic episodes: Secondary | ICD-10-CM

## 2012-07-19 DIAGNOSIS — G8929 Other chronic pain: Secondary | ICD-10-CM

## 2012-07-19 DIAGNOSIS — M25569 Pain in unspecified knee: Secondary | ICD-10-CM

## 2012-07-19 DIAGNOSIS — M25561 Pain in right knee: Secondary | ICD-10-CM

## 2012-07-19 DIAGNOSIS — F309 Manic episode, unspecified: Secondary | ICD-10-CM

## 2012-07-19 MED ORDER — FLUOXETINE HCL 20 MG PO TABS: 20.0000 mg | ORAL_TABLET | Freq: Every day | ORAL | Status: DC

## 2012-07-19 MED ORDER — OXYCODONE-ACETAMINOPHEN 5-325 MG PO TABS: 1.0000 | ORAL_TABLET | Freq: Four times a day (QID) | ORAL | Status: DC | PRN

## 2012-07-19 MED ORDER — TIZANIDINE HCL 4 MG PO CAPS: 4.0000 mg | ORAL_CAPSULE | Freq: Three times a day (TID) | ORAL | Status: DC | PRN

## 2012-07-19 MED ORDER — METHYLPREDNISOLONE ACETATE 80 MG/ML IJ SUSP
80.0000 mg | Freq: Once | INTRAMUSCULAR | Status: AC
  Administered 2012-07-19: 80 mg via INTRA_ARTICULAR

## 2012-07-19 MED ORDER — ALPRAZOLAM 1 MG PO TABS: 1.0000 mg | ORAL_TABLET | Freq: Every evening | ORAL | Status: DC | PRN

## 2012-07-19 NOTE — Progress Notes (Signed)
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 Chronic low back pain treated with TENS unit 2. Right shoulder pain with an associated swelling 3. Right knee pain, locking after sitting with flexion.  S/P surgery x 3 by Dr. Renae Fickle 4. Electric shocks on dorsum of right foot (diabetic with some numbness)  Objective:  Pressured speech  Right foot: no edema, good pulse, no skin breaks or calluses Right Knee:  Multiple well-healed surgical scars.  Nontender with no effusion Right shoulder:  Tender biceps tendon and anterior joint line.  Pain reproduced with resisted elbow flexion.  Sterile prep right shoulder Anterior approach:  depomedrol 80 mg and marcaine 1cc infiltrated over right biceps No complications  Assessment:  Biceps tendonitis, hypomanic  Plan:  Percocet 5-325 #80 qid prn  Hypomania - Plan: FLUoxetine (PROZAC) 20 MG tablet  Biceps tendonitis, right - Plan: methylPREDNISolone acetate (DEPO-MEDROL) injection 80 mg  Back pain without radiation - Plan: Ambulatory referral to Pain Clinic  Knee pain, chronic, right - Plan: Ambulatory referral to Pain Clinic  Signed, Elvina Sidle, MD

## 2012-07-21 ENCOUNTER — Telehealth: Payer: Self-pay

## 2012-07-21 ENCOUNTER — Ambulatory Visit (INDEPENDENT_AMBULATORY_CARE_PROVIDER_SITE_OTHER): Payer: BC Managed Care – PPO | Admitting: Family Medicine

## 2012-07-21 VITALS — BP 138/85 | HR 106 | Temp 98.4°F | Resp 18 | Ht 70.0 in | Wt 250.0 lb

## 2012-07-21 DIAGNOSIS — M25511 Pain in right shoulder: Secondary | ICD-10-CM

## 2012-07-21 DIAGNOSIS — I10 Essential (primary) hypertension: Secondary | ICD-10-CM

## 2012-07-21 DIAGNOSIS — F308 Other manic episodes: Secondary | ICD-10-CM

## 2012-07-21 DIAGNOSIS — F309 Manic episode, unspecified: Secondary | ICD-10-CM

## 2012-07-21 DIAGNOSIS — M25519 Pain in unspecified shoulder: Secondary | ICD-10-CM

## 2012-07-21 DIAGNOSIS — E119 Type 2 diabetes mellitus without complications: Secondary | ICD-10-CM

## 2012-07-21 MED ORDER — GLIMEPIRIDE 4 MG PO TABS: 4.0000 mg | ORAL_TABLET | Freq: Every day | ORAL | Status: DC

## 2012-07-21 MED ORDER — CYCLOBENZAPRINE HCL 10 MG PO TABS: 10.0000 mg | ORAL_TABLET | Freq: Three times a day (TID) | ORAL | Status: DC | PRN

## 2012-07-21 MED ORDER — METOPROLOL TARTRATE 25 MG PO TABS: 25.0000 mg | ORAL_TABLET | Freq: Every day | ORAL | Status: DC

## 2012-07-21 MED ORDER — METFORMIN HCL 1000 MG PO TABS: 1000.0000 mg | ORAL_TABLET | Freq: Two times a day (BID) | ORAL | Status: DC

## 2012-07-21 MED ORDER — ALPRAZOLAM ER 2 MG PO TB24: 2.0000 mg | ORAL_TABLET | ORAL | Status: DC

## 2012-07-21 MED ORDER — INSULIN GLARGINE 100 UNIT/ML ~~LOC~~ SOLN: 30.0000 [IU] | Freq: Every day | SUBCUTANEOUS | Status: DC

## 2012-07-21 NOTE — Telephone Encounter (Signed)
Patient was seen recently by Dr Milus Glazier. States he didn't like how his visit was very rushed and no one took the time to find out what was wrong with him. Says that he had an injection and now he has arm pain and cant even lift his arm. Also states the RX 's that were called in he cant take them. The assistant changed them right before he left. Says that none of his diabetic meds were called in at all. From now on he either was hard copies of his Rx's or called in right in front of him. He wants to have choices when it comes to his healthcare and doesn't want to be rushed out of the office. He is coming back to see Dr L and wants to start everything over as far as his RX's go.

## 2012-07-21 NOTE — Telephone Encounter (Signed)
Patient was very upset and wants to speak to Dr. Milus Glazier (he says its an emergency) because he has no medicine called into his pharmacy. He is very disappointed. He is very aggressive over the phone and would not let me off the phone cursing about Dr. Milus Glazier and his medications. He feels like we waste his money coming into our office.   [phone]

## 2012-07-21 NOTE — Progress Notes (Signed)
Sx:  52 yo man who works with [employer].    He wants more percocet than what I ordered.  He also wants more affordable medicine  His right shoulder is still sore  Objective:  NAD Pressured speech.  Patient understands that he has racing thoughts An inability to slow down and process one idea to time. I spent time reiterating his need for psychiatric evaluation and agreed to refer him to both psychiatry and orthopedics  Tender right shoulder at area of biceps insertion  Assessment:  Hypomania  Plan:    Pain in joint, shoulder region, right - Plan: ALPRAZolam (XANAX XR) 2 MG 24 hr tablet, cyclobenzaprine (FLEXERIL) 10 MG tablet, Ambulatory referral to Orthopedic Surgery  Hypomania - Plan: Ambulatory referral to Psychiatry  Signed, Elvina Sidle, MD

## 2012-07-21 NOTE — Telephone Encounter (Signed)
Spoke with patient and he is having some problems with decrease ROM in arm and thinks its from injection. He would like that to be checked out and discuss medications. He is going to come in today at 2pm to see Dr. Milus Glazier.

## 2012-07-22 NOTE — Telephone Encounter (Signed)
Patient did come in yesterday to discuss with Dr Milus Glazier.

## 2012-08-04 ENCOUNTER — Ambulatory Visit (INDEPENDENT_AMBULATORY_CARE_PROVIDER_SITE_OTHER): Payer: BC Managed Care – PPO | Admitting: Family Medicine

## 2012-08-04 VITALS — BP 120/90 | HR 112 | Temp 98.3°F | Resp 20 | Ht 67.25 in | Wt 251.6 lb

## 2012-08-04 DIAGNOSIS — M25519 Pain in unspecified shoulder: Secondary | ICD-10-CM

## 2012-08-04 DIAGNOSIS — M25511 Pain in right shoulder: Secondary | ICD-10-CM

## 2012-08-04 MED ORDER — HYDROCODONE-ACETAMINOPHEN 5-325 MG PO TABS: ORAL_TABLET | ORAL | Status: DC

## 2012-08-04 NOTE — Progress Notes (Signed)
52 yo man with diabetes and heavy dosage percocet for right shoulder.  He has been weaning from the percocet.  He just got new jobs working at [employer]. Patient does not wish to go to a pain clinic. He says he'll stop taking the Percocet.  Objective: Patient has pain with internal/external rotation of his right shoulder. He also has some pain when he flexes his biceps. Nevertheless, he feels much better than he did last time he was here.  Patient is much less anxious and pressured speech is diminished significantly.  Assessment: Patient has a chronic anxiety problem. He continues to workout in the gym and some of this is causing some shoulder pain most likely rotator cuff in nature. At any rate he is getting better and we're able to cut down on his medications significantly.  Plan: Patient given some exercises and told not to take any more than one Norco twice a day.  He's supposed to come back in a month for followup Prograf signed, Elvina Sidle

## 2012-08-05 ENCOUNTER — Telehealth: Payer: Self-pay

## 2012-08-05 NOTE — Telephone Encounter (Signed)
Pt wants to talk with dr Milus Glazier regarding shoulder pain. States blood pressure good right now adn is want to try a steriod Please call pt to advise  Pt uses [facility] on [address]

## 2012-08-07 ENCOUNTER — Other Ambulatory Visit: Payer: Self-pay | Admitting: Family Medicine

## 2012-08-07 DIAGNOSIS — M25511 Pain in right shoulder: Secondary | ICD-10-CM

## 2012-08-07 MED ORDER — PREDNISONE 20 MG PO TABS: ORAL_TABLET | ORAL | Status: DC

## 2012-08-07 NOTE — Telephone Encounter (Signed)
Patient advised.

## 2012-08-07 NOTE — Telephone Encounter (Signed)
Patient asking for steroid for shoulder pain/ please advise.

## 2012-08-07 NOTE — Telephone Encounter (Signed)
Prednisone called in

## 2012-08-27 ENCOUNTER — Telehealth: Payer: Self-pay | Admitting: Family Medicine

## 2012-08-27 NOTE — Telephone Encounter (Signed)
Spoke with patient and he has been going back and forth staying with his grandmother in [place]. Washed his pants which had his alprazolam bottle in it. Wants to know if he can get new rx. Advised him to hold on to bottle and do not throw away just in case he needed to bring in. Please advise

## 2012-08-31 NOTE — Telephone Encounter (Signed)
See Dr. Loma Boston note

## 2012-08-31 NOTE — Telephone Encounter (Signed)
In order to refill, he must bring in the bottle.  Then, I would authorize a refill.

## 2012-09-02 NOTE — Telephone Encounter (Signed)
Left message for him to call me back.  

## 2012-09-05 NOTE — Telephone Encounter (Signed)
Patient not responding to phone calls.

## 2012-09-22 ENCOUNTER — Telehealth: Payer: Self-pay | Admitting: Family Medicine

## 2012-09-22 NOTE — Telephone Encounter (Signed)
Spoke with patient patient will return back to clinic next week with the empty bottle

## 2012-09-25 ENCOUNTER — Telehealth: Payer: Self-pay

## 2012-09-25 NOTE — Telephone Encounter (Signed)
Need more information.  What medication?  If this is regarding alprazolam, see previous telephone notes.

## 2012-09-25 NOTE — Telephone Encounter (Signed)
Patient states one of his medications was only sent in to be taken once a day when it used to be twice a day.  Says the once a day is not working for him.  Call back at [phone].

## 2012-09-25 NOTE — Telephone Encounter (Signed)
He was to bring in the Rx bottle and exchange, but I do not see where it was replaced, I am not sure what he is referring to. Left message for him to call back.

## 2012-09-27 NOTE — Telephone Encounter (Signed)
Spoke with pt, advised to bring bottle. He also got Toprol 25 mg and it was written for once a day but it was supposed to be twice daily. He states he was taken off his Norvasc and his bottom number is staying at 95 and he only received two weeks of medication. Can we write for 30 more or is this the dose and frequency he should be on?

## 2012-09-28 ENCOUNTER — Other Ambulatory Visit: Payer: Self-pay | Admitting: Family Medicine

## 2012-09-28 DIAGNOSIS — I10 Essential (primary) hypertension: Secondary | ICD-10-CM

## 2012-09-28 MED ORDER — METOPROLOL SUCCINATE ER 25 MG PO TB24: 25.0000 mg | ORAL_TABLET | Freq: Two times a day (BID) | ORAL | Status: DC

## 2013-04-24 ENCOUNTER — Other Ambulatory Visit: Payer: Self-pay | Admitting: Family Medicine

## 2013-04-25 NOTE — Telephone Encounter (Signed)
Pended 1 mos w/note pt needs OV for more. Do you want to RF?

## 2013-04-25 NOTE — Telephone Encounter (Signed)
Faxed

## 2013-07-24 ENCOUNTER — Telehealth: Payer: Self-pay

## 2013-07-24 NOTE — Telephone Encounter (Signed)
Pt would like to speak with someone about his shoulder pain. 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

## 2013-07-26 NOTE — Telephone Encounter (Signed)
Pt has not been here since 07/2012.  He needs to RTC. Left message on machine to call back

## 2013-07-27 NOTE — Telephone Encounter (Signed)
Pt.notified

## 2015-04-29 ENCOUNTER — Emergency Department (HOSPITAL_COMMUNITY)
Admission: EM | Admit: 2015-04-29 | Discharge: 2015-04-29 | Disposition: A | Discharge status: Home / Self Care | Payer: BLUE CROSS/BLUE SHIELD | Attending: Emergency Medicine | Admitting: Emergency Medicine

## 2015-04-29 ENCOUNTER — Encounter (HOSPITAL_COMMUNITY): Payer: Self-pay | Admitting: Emergency Medicine

## 2015-04-29 DIAGNOSIS — Z8669 Personal history of other diseases of the nervous system and sense organs: Secondary | ICD-10-CM | POA: Diagnosis not present

## 2015-04-29 DIAGNOSIS — Z79899 Other long term (current) drug therapy: Secondary | ICD-10-CM | POA: Insufficient documentation

## 2015-04-29 DIAGNOSIS — L03113 Cellulitis of right upper limb: Secondary | ICD-10-CM | POA: Insufficient documentation

## 2015-04-29 DIAGNOSIS — Z7984 Long term (current) use of oral hypoglycemic drugs: Secondary | ICD-10-CM | POA: Insufficient documentation

## 2015-04-29 DIAGNOSIS — E119 Type 2 diabetes mellitus without complications: Secondary | ICD-10-CM | POA: Insufficient documentation

## 2015-04-29 DIAGNOSIS — Z794 Long term (current) use of insulin: Secondary | ICD-10-CM | POA: Insufficient documentation

## 2015-04-29 DIAGNOSIS — I509 Heart failure, unspecified: Secondary | ICD-10-CM | POA: Diagnosis not present

## 2015-04-29 DIAGNOSIS — F17210 Nicotine dependence, cigarettes, uncomplicated: Secondary | ICD-10-CM | POA: Insufficient documentation

## 2015-04-29 DIAGNOSIS — M79601 Pain in right arm: Secondary | ICD-10-CM | POA: Diagnosis present

## 2015-04-29 DIAGNOSIS — R000 Tachycardia, unspecified: Secondary | ICD-10-CM | POA: Insufficient documentation

## 2015-04-29 DIAGNOSIS — I10 Essential (primary) hypertension: Secondary | ICD-10-CM | POA: Insufficient documentation

## 2015-04-29 DIAGNOSIS — Z880 Allergy status to penicillin: Secondary | ICD-10-CM | POA: Diagnosis not present

## 2015-04-29 HISTORY — DX: Heart failure, unspecified: I50.9

## 2015-04-29 LAB — CBC WITH DIFFERENTIAL/PLATELET
Basophils Absolute: 0 10*3/uL (ref 0.0–0.1)
Basophils Relative: 0 %
EOS ABS: 0.3 10*3/uL (ref 0.0–0.7)
Eosinophils Relative: 2 %
HEMATOCRIT: 43.6 % (ref 39.0–52.0)
HEMOGLOBIN: 15.1 g/dL (ref 13.0–17.0)
LYMPHS ABS: 3.2 10*3/uL (ref 0.7–4.0)
Lymphocytes Relative: 25 %
MCH: 29.9 pg (ref 26.0–34.0)
MCHC: 34.6 g/dL (ref 30.0–36.0)
MCV: 86.3 fL (ref 78.0–100.0)
MONO ABS: 0.9 10*3/uL (ref 0.1–1.0)
MONOS PCT: 7 %
NEUTROS ABS: 8.3 10*3/uL — AB (ref 1.7–7.7)
NEUTROS PCT: 66 %
Platelets: 235 10*3/uL (ref 150–400)
RBC: 5.05 MIL/uL (ref 4.22–5.81)
RDW: 13.8 % (ref 11.5–15.5)
WBC: 12.7 10*3/uL — ABNORMAL HIGH (ref 4.0–10.5)

## 2015-04-29 LAB — COMPREHENSIVE METABOLIC PANEL
ALBUMIN: 4.1 g/dL (ref 3.5–5.0)
ALK PHOS: 60 U/L (ref 38–126)
ALT: 27 U/L (ref 17–63)
AST: 26 U/L (ref 15–41)
Anion gap: 11 (ref 5–15)
BUN: 11 mg/dL (ref 6–20)
CALCIUM: 9.7 mg/dL (ref 8.9–10.3)
CHLORIDE: 103 mmol/L (ref 101–111)
CO2: 26 mmol/L (ref 22–32)
CREATININE: 0.78 mg/dL (ref 0.61–1.24)
GFR calc Af Amer: >60 mL/min (ref >60)
GFR calc non Af Amer: >60 mL/min (ref >60)
GLUCOSE: 154 mg/dL — AB (ref 65–99)
Potassium: 3.8 mmol/L (ref 3.5–5.1)
SODIUM: 140 mmol/L (ref 135–145)
Total Bilirubin: 0.7 mg/dL (ref 0.3–1.2)
Total Protein: 8.8 g/dL — ABNORMAL HIGH (ref 6.5–8.1)

## 2015-04-29 MED ORDER — CLINDAMYCIN HCL 150 MG PO CAPS: 450.0000 mg | ORAL_CAPSULE | Freq: Three times a day (TID) | ORAL | Status: DC

## 2015-04-29 MED ORDER — CLINDAMYCIN HCL 150 MG PO CAPS
450.0000 mg | ORAL_CAPSULE | Freq: Once | ORAL | Status: AC
  Administered 2015-04-29: 450 mg via ORAL
  Filled 2015-04-29: qty 1

## 2015-04-29 MED ORDER — SODIUM CHLORIDE 0.9 % IV BOLUS (SEPSIS)
500.0000 mL | Freq: Once | INTRAVENOUS | Status: AC
Start: 2015-04-29 — End: 2015-04-29
  Administered 2015-04-29: 500 mL via INTRAVENOUS

## 2015-04-29 NOTE — ED Provider Notes (Signed)
CSN: [id number]     Arrival date & time 04/29/15  [id number] History   First MD Initiated Contact with Patient 04/29/15 0745     Chief Complaint  Patient presents with  . Insect Bite     (Consider location/radiation/quality/duration/timing/severity/associated sxs/prior Treatment) HPI Cody Nolan is a 55 y.o. male with history of hypertension, tachycardia and diabetes comes in for evaluation of possible insect bite. 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 He thinks he may have been bitten by a spider on Tuesday. Since that time he reports 3 spots on his right arm that are slightly tender, are indurated and draining some clear fluid. He has been putting rubbing alcohol on his without resolution of his symptoms. He denies fevers, chills, difficulties breathing, other rash, nausea or vomiting, abdominal pain, unusual urinary symptoms, diarrhea or constipation. No other modifying factors. Specifically denies any recreational, illicit or IV drug use. Of note, patient reports that his heart rate is usually very fast, upon review of old notes this appears to be true. Patient reports she has been prescribed Toprol, but is not taking this medication due to affordability.  Past Medical History  Diagnosis Date  . Diabetes mellitus   . Hypertension   . Tachycardia   . Sleep apnea   . Sleep apnea   . CHF (congestive heart failure) [facility](HCC)    Past Surgical History  Procedure Laterality Date  . Cholecystectomy    . Knee  3 knee surgeries   Family History  Problem Relation Age of Onset  . Heart disease Mother   . Hypertension Other    Social History  Substance Use Topics  . Smoking status: Current Every Day Smoker -- 0.75 packs/day for 10 years    Types: Cigarettes  . Smokeless tobacco: Never Used  . Alcohol Use: 0.0 oz/week     Comment: 2 cans of beer a month    Review of Systems A 10 point review of systems was completed and was negative except for pertinent  positives and negatives as mentioned in the history of present illness     Allergies  Aleve; Penicillins; and Sulfa antibiotics  Home Medications   Prior to Admission medications   Medication Sig Start Date End Date Taking? Authorizing Provider  diphenhydrAMINE (BENADRYL) 25 MG tablet Take 50 mg by mouth 2 (two) times daily.   Yes Historical Provider, MD  esomeprazole (NEXIUM) 40 MG capsule Take 40 mg by mouth daily at 12 noon.   Yes Historical Provider, MD  glimepiride (AMARYL) 4 MG tablet Take 1 tablet (4 mg total) by mouth daily. 07/21/12  Yes Elvina SidleKurt Lauenstein, MD  insulin glargine (LANTUS) 100 UNIT/ML injection Inject 0.3 mLs (30 Units total) into the skin at bedtime. 07/21/12  Yes Elvina SidleKurt Lauenstein, MD  metoprolol (LOPRESSOR) 100 MG tablet Take 100 mg by mouth daily.   Yes Historical Provider, MD  tiZANidine (ZANAFLEX) 4 MG tablet Take 4 mg by mouth 2 (two) times daily as needed for muscle spasms.   Yes Historical Provider, MD  clindamycin (CLEOCIN) 150 MG capsule Take 3 capsules (450 mg total) by mouth 3 (three) times daily. X 6 days 04/29/15   Joycie PeekBenjamin Anjolie Majer, PA-C   BP 125/78 mmHg  Pulse 96  Temp(Src) 98.7 F (37.1 C) (Oral)  Resp 18  SpO2 97% Physical Exam  Constitutional: He is oriented to person, place, and time. He appears well-developed and well-nourished.  HENT:  Head: Normocephalic and atraumatic.  Mouth/Throat: Oropharynx is  clear and moist.  Eyes: Conjunctivae are normal. Pupils are equal, round, and reactive to light. Right eye exhibits no discharge. Left eye exhibits no discharge. No scleral icterus.  Neck: Neck supple.  Cardiovascular: Normal rate, regular rhythm and normal heart sounds.   Mild tachycardia with normal heart sounds.  Pulmonary/Chest: Effort normal and breath sounds normal. No respiratory distress. He has no wheezes. He has no rales.  Abdominal: Soft. There is no tenderness.  Musculoskeletal: He exhibits no tenderness.  Neurological: He is alert and  oriented to person, place, and time.  Cranial Nerves II-XII grossly intact  Skin: Skin is warm and dry.  3 lesions noted to right upper extremity. One at dorsum of right wrist, right mid forearm and distal right she wrist. Small circumferential lesions, mild erythema and tenderness with induration surrounding lesions. Upper lesion is actively draining serous fluid. No necrosis, crepitus  Psychiatric: He has a normal mood and affect.  Nursing note and vitals reviewed.   ED Course  Procedures (including critical care time) Labs Review Labs Reviewed  COMPREHENSIVE METABOLIC PANEL - Abnormal; Notable for the following:    Glucose, Bld 154 (*)    Total Protein 8.8 (*)    All other components within normal limits  CBC WITH DIFFERENTIAL/PLATELET - Abnormal; Notable for the following:    WBC 12.7 (*)    Neutro Abs 8.3 (*)    All other components within normal limits    Imaging Review No results found. I have personally reviewed and evaluated these images and lab results as part of my medical decision-making.   EKG Interpretation None     Meds given in ED:  Medications  sodium chloride 0.9 % bolus 500 mL (0 mLs Intravenous Stopped 04/29/15 0913)  clindamycin (CLEOCIN) capsule 450 mg (450 mg Oral Given 04/29/15 1112)    Discharge Medication List as of 04/29/2015 10:44 AM    START taking these medications   Details  clindamycin (CLEOCIN) 150 MG capsule Take 3 capsules (450 mg total) by mouth 3 (three) times daily. X 6 days, Starting 04/29/2015, Until Discontinued, Print       Filed Vitals:   04/29/15 0644 04/29/15 1012  BP: 117/81 125/78  Pulse: 122 96  Temp: 98.7 F (37.1 C)   TempSrc: Oral   Resp: 18 18  SpO2: 94% 97%    MDM  Cody Nolan is a 55 y.o. male with history of hypertension, diabetes, comes in for evaluation of possible insect bite. On arrival, patient is afebrile, tachycardic at 122, but this quickly resolves with patient is at rest. He reports a history of  tachycardia in the past, was placed on Toprol, but has not been taking this medication due to financial constraints. Denies any chest pain, shortness of breath, leg swelling, other cough. Reports associated possible insect bites to his right arm. No history of IV drug use or other illicit/recreational drug use. Patient does have 3 areas of circumferential, mild induration and tenderness consistent with a cellulitis. No fluctuance or other evidence of drainable abscess. Mild leukocytosis 12.7. Suspect a mild infection, will treat with outpatient antibiotics, wound recheck in 3 days. Also consult to case management for help with home medications. Patient reports she will be able to obtain his medications, discharged with prescription for clindamycin, and also given dose in emergency department before discharge. Overall he appears very well, nontoxic and hemodynamically stable, appropriate for discharge. Final diagnoses:  Cellulitis of right upper extremity  Joycie Peek, PA-C 04/29/15 1603  Lorre Nick, MD 05/03/15 [id number]

## 2015-04-29 NOTE — ED Notes (Signed)
Pt states he thinks he was bitten by a spider on Monday  Pt did not see a spider  Pt has three separate areas noted on his right arm that are red, raised, hot to touch  One area on the upper arm has pustules with drainage noted  Pt states the areas still burn

## 2015-04-29 NOTE — Discharge Instructions (Signed)
You have a skin infection in your right arm. You will be treated for this with antibiotics. It is important that you fill your prescription for the antibiotics and take this medication as prescribed. Take all of your antibiotic medication, do not save or share them. Follow up with your doctor in 3 days for a wound recheck. Return to ED for any new or worsening symptoms as we discussed.  Cellulitis Cellulitis is an infection of the skin and the tissue beneath it. The infected area is usually red and tender. Cellulitis occurs most often in the arms and lower legs.  CAUSES  Cellulitis is caused by bacteria that enter the skin through cracks or cuts in the skin. The most common types of bacteria that cause cellulitis are staphylococci and streptococci. SIGNS AND SYMPTOMS   Redness and warmth.  Swelling.  Tenderness or pain.  Fever. DIAGNOSIS  Your health care provider can usually determine what is wrong based on a physical exam. Blood tests may also be done. TREATMENT  Treatment usually involves taking an antibiotic medicine. HOME CARE INSTRUCTIONS   Take your antibiotic medicine as directed by your health care provider. Finish the antibiotic even if you start to feel better.  Keep the infected arm or leg elevated to reduce swelling.  Apply a warm cloth to the affected area up to 4 times per day to relieve pain.  Take medicines only as directed by your health care provider.  Keep all follow-up visits as directed by your health care provider. SEEK MEDICAL CARE IF:   You notice red streaks coming from the infected area.  Your red area gets larger or turns dark in color.  Your bone or joint underneath the infected area becomes painful after the skin has healed.  Your infection returns in the same area or another area.  You notice a swollen bump in the infected area.  You develop new symptoms.  You have a fever. SEEK IMMEDIATE MEDICAL CARE IF:   You feel very sleepy.  You  develop vomiting or diarrhea.  You have a general ill feeling (malaise) with muscle aches and pains.   This information is not intended to replace advice given to you by your health care provider. Make sure you discuss any questions you have with your health care provider.   Document Released: 10/26/2004 Document Revised: 10/07/2014 Document Reviewed: 04/03/2011 Elsevier Interactive Patient Education [employer]2016 Elsevier Inc.

## 2015-04-29 NOTE — Progress Notes (Signed)
ED CM consulted about this pt needing medication assistance  CM reviewed ED registration for pt insurance status Cm spoke with pt who informed ED CM he does not have trouble getting meds Pt adamant he has blue cross and blue shield but just can not pay for his medication until 05/04/15 Pt reports most of his meds are generic and he can afford those except Lantus and he has worked with his insurance carrier on an alternative Pt prefers to be given a dose of antibiotic at [facility] [employer] or a Rx for a $4 antibiotic and d/c  Cm discussed with pt that [facility] does not have a medication assistance program for patients covered by insurance carriers Discussed that EDs are not allowed to provide samples Pt voiced understanding  CM reviewed medication resources like goodrx and provided pt a written copy of this plus other medication resources Pt is aware of needymeds for pt assistance programs to get samples sent to pcp  CM discussed with ED PA/NP Romeo AppleBen what pt prefers Recommend pt be given a Rx until he can get monies to fill it

## 2019-09-26 ENCOUNTER — Ambulatory Visit (INDEPENDENT_AMBULATORY_CARE_PROVIDER_SITE_OTHER): Payer: BLUE CROSS/BLUE SHIELD | Admitting: Family Medicine

## 2019-09-26 ENCOUNTER — Encounter: Payer: Self-pay | Admitting: Family Medicine

## 2019-09-26 ENCOUNTER — Other Ambulatory Visit: Payer: Self-pay

## 2019-09-26 VITALS — BP 108/70 | HR 105 | Ht 67.0 in | Wt 214.0 lb

## 2019-09-26 DIAGNOSIS — Z794 Long term (current) use of insulin: Secondary | ICD-10-CM

## 2019-09-26 DIAGNOSIS — R739 Hyperglycemia, unspecified: Secondary | ICD-10-CM

## 2019-09-26 DIAGNOSIS — E118 Type 2 diabetes mellitus with unspecified complications: Secondary | ICD-10-CM

## 2019-09-26 DIAGNOSIS — I252 Old myocardial infarction: Secondary | ICD-10-CM | POA: Insufficient documentation

## 2019-09-26 DIAGNOSIS — I10 Essential (primary) hypertension: Secondary | ICD-10-CM

## 2019-09-26 DIAGNOSIS — Z8679 Personal history of other diseases of the circulatory system: Secondary | ICD-10-CM | POA: Insufficient documentation

## 2019-09-26 DIAGNOSIS — F4321 Adjustment disorder with depressed mood: Secondary | ICD-10-CM | POA: Insufficient documentation

## 2019-09-26 DIAGNOSIS — Z1159 Encounter for screening for other viral diseases: Secondary | ICD-10-CM

## 2019-09-26 LAB — POCT GLYCOSYLATED HEMOGLOBIN (HGB A1C): HbA1c, POC (controlled diabetic range): 12.3 % — AB (ref 0.0–7.0)

## 2019-09-26 MED ORDER — TRESIBA FLEXTOUCH 100 UNIT/ML ~~LOC~~ SOPN: 30.0000 [IU] | PEN_INJECTOR | Freq: Every day | SUBCUTANEOUS | 0 refills | Status: DC

## 2019-09-26 MED ORDER — TIZANIDINE HCL 4 MG PO TABS: 4.0000 mg | ORAL_TABLET | Freq: Two times a day (BID) | ORAL | 1 refills | Status: DC | PRN

## 2019-09-26 MED ORDER — METFORMIN HCL 1000 MG PO TABS: 1000.0000 mg | ORAL_TABLET | Freq: Two times a day (BID) | ORAL | 5 refills | Status: DC

## 2019-09-26 MED ORDER — FLUOXETINE HCL 20 MG PO TABS: 20.0000 mg | ORAL_TABLET | Freq: Every day | ORAL | 3 refills | Status: DC

## 2019-09-26 MED ORDER — GABAPENTIN 300 MG PO CAPS: 300.0000 mg | ORAL_CAPSULE | Freq: Three times a day (TID) | ORAL | 3 refills | Status: DC

## 2019-09-26 NOTE — Progress Notes (Signed)
    SUBJECTIVE:   CHIEF COMPLAINT / HPI:   DM His current medication includes: -Metformin 100 mg BID -lantus 30 units He has been out of all of his medication for at least the past week.  He does not currently have any source of income or insurance.  He was hoping to see if we had any free samples today.  He reports that he has never been on a statin medication.  He has a glucometer at home which is not currently functional.  His glucometer is the vario one touch.  HTN His current medications include: -Metop 50 mg daily -Amlodipine 10 mg daily  Back pain He has been evaluated at urgent care for his low back pain and been prescribed tizanidine, tramadol and other stronger opioid medication.  Today, he would like a refill of his tizanidine and tramadol.  Grief He reports that his wife passed away about 20 months ago.  This is brought up on multiple occasions during her encounter.  He notes that he still feels intense bouts of sadness as though this occurred only yesterday.  He has not previously been on medication and is interested in medication therapy at this time.  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  This is sometimes numbness and sometimes a sharp or stabbing pain.  He notes that the discomfort is more significant in his right foot.  He currently takes gabapentin 300 mg 3 times daily.   PERTINENT  PMH / PSH: Congestive heart failure, MI  OBJECTIVE:   BP 108/70   Pulse (!) 105   Ht 5\' 7"  (1.702 m)   Wt 214 lb (97.1 kg)   SpO2 97%   BMI 33.52 kg/m    General: Alert and cooperative and appears to be in no acute distress HEENT: Neck non-tender without lymphadenopathy, masses or thyromegaly Cardio: Normal S1 and S2, no S3 or S4. Rhythm is regular. No murmurs or rubs.   Pulm: Clear to auscultation bilaterally, no crackles, wheezing, or diminished breath sounds. Normal respiratory effort Abdomen: Bowel sounds normal. Abdomen soft and  non-tender.  Back: No tenderness with percussion of the lumbar spine.  Mild discomfort with palpation of the left SI joint.  No paraspinal tenderness. Extremities: No peripheral edema. Warm/ well perfused.  Strong radial pulse.    ASSESSMENT/PLAN:   History of MI (myocardial infarction) Not currently on statin medication.  He is also over 40 with history of diabetes. -Consider initiating statin at future visit  Essential hypertension, benign Well-controlled on current medication.  Not currently taking ACE or ARB. -Continue metoprolol and amlodipine -Consider ACE or ARB in place of amlodipine at future visit -Follow-up BMP  Controlled diabetes mellitus type 2 with complications (HCC) -Refilled Metformin -Degludec samples provided for 1 month -Would benefit from SGLT2 inhibitor or GLP-1 medication in the future.  We will send a message to Dr. about providing these medications for patients without insurance. -Not currently on statin therapy -Not currently on ACE/ARB -Follow-up in 1 week  Grief -Start fluoxetine 20 mg daily -List of therapist in the community provided -Follow-up in 4 weeks     Raymondo Band, MD [facility]

## 2019-09-26 NOTE — Assessment & Plan Note (Signed)
Not currently on statin medication.  He is also over 40 with history of diabetes. -Consider initiating statin at future visit

## 2019-09-26 NOTE — Assessment & Plan Note (Signed)
-  Start fluoxetine 20 mg daily -List of therapist in the community provided -Follow-up in 4 weeks

## 2019-09-26 NOTE — Patient Instructions (Addendum)
Diabetes: I have refilled your Metformin.  Please also start taking your insulin daily.  We can provide some free samples today.  Back pain: If tizanidine is helpful for her back pain, I recommend continued using that.  Avoid NSAIDs for pain because you have a history of a heart attack.  Try taking Tylenol as a pain reliever.  You can take up to 4000 mg of Tylenol daily.  I do not recommend you take that much every day.  Please come back to clinic in the next 1-2 weeks so we can have a longer discussion about diabetes.  There a number of things we can do to improve your treatment.  Grief: We are going to start on a medication today called fluoxetine.  This can take as long as 6 weeks to take full effect.  Please make sure that come back in about 4 weeks to see how you are doing.  Please also try to get connected with one of the providers below in the community for therapy.  Outpatient Mental Health Providers (No Insurance required or Self Pay)  [facility]  [address] [place], [place] [phone] Crisis [phone]  [facility]) can see uninsured folks for outpatient therapy [email or web address] [address] [place], [place] [address] [phone]  [facility]    Walk-in Mon-Fri, 8am-3pm [email or web address] [address] [place], [place]  [phone]   [address]  [place] [phone] [facility] for psych med management, there may be a wait- if [facility] is working with clients for OPT, they will coordinate with [facility] for psych  [facility]   Walk-in-Clinic: Monday- Friday 9:00 AM - 4:00 PM [address]   [place], [place] [phone]  [facility] [place] ([employer]) walk in M-F 8am-12pm and  1pm-3pm [place]- [address]     [phone]  [employer] [address]  Phone: [phone]  [employer] (Mental Health and substance  challenges) [address]   [place] [phone]    [email or web address]    [facility]  [facility] -[address], [place]     Phone:  [phone] [place]-  [address]  [phone]   [facility]  [address] [place]  [phone] [email or web address]   [facility] ( virtual or zoom therapy) [email or web address]  [address] [place]  [phone]    [facility] [phone]  grief counseling, dementia and caregiver support    [facility] Walk-in MWF 12:30 to 3:00     [address] [place] [address]  [phone]  [email or web address].[email or web address] call to schedule an appointment    [facility] Classes ,Support group, Peer support services, [address] [place], [place] [address] [phone]  PhotoSolver.pl           [facility] on Mental Illness (NAMI) [place]- Wellness classes, Support groups        [address] [place], [place] [address] [phone]   [email or web address]   [facility]  (Psycho-social Rehabilitation clubhouse, Individual and group therapy) [address] [place], [place] [address]   [phone]  24- Hour Availability:  [facility] [phone] or [phone] * [facility] [employer] (Domestic Violence, Rape, etc. )7654328374 Vesta Mixer [phone] or [phone] * [facility] [phone] only)  [phone] (after hours) *[facility] Mobile Crisis Unit [phone] *[place] National Suicide Hotline (913) 303-6343 Len Childs)

## 2019-09-26 NOTE — Assessment & Plan Note (Addendum)
-  Refilled Metformin -Degludec samples provided for 1 month -Would benefit from SGLT2 inhibitor or GLP-1 medication in the future.  We will send a message to Dr. Raymondo Band about providing these medications for patients without insurance. -Not currently on statin therapy -Not currently on ACE/ARB -Follow-up in 1 week

## 2019-09-26 NOTE — Assessment & Plan Note (Signed)
Well-controlled on current medication.  Not currently taking ACE or ARB. -Continue metoprolol and amlodipine -Consider ACE or ARB in place of amlodipine at future visit -Follow-up BMP

## 2019-09-27 LAB — BASIC METABOLIC PANEL
BUN/Creatinine Ratio: 21 — ABNORMAL HIGH (ref 9–20)
BUN: 15 mg/dL (ref 6–24)
CO2: 21 mmol/L (ref 20–29)
Calcium: 9.9 mg/dL (ref 8.7–10.2)
Chloride: 97 mmol/L (ref 96–106)
Creatinine, Ser: 0.73 mg/dL — ABNORMAL LOW (ref 0.76–1.27)
GFR calc Af Amer: 118 mL/min/{1.73_m2} (ref >59)
GFR calc non Af Amer: 102 mL/min/{1.73_m2} (ref >59)
Glucose: 200 mg/dL — ABNORMAL HIGH (ref 65–99)
Potassium: 4.2 mmol/L (ref 3.5–5.2)
Sodium: 138 mmol/L (ref 134–144)

## 2019-09-27 LAB — LIPID PANEL
Chol/HDL Ratio: 5.6 ratio — ABNORMAL HIGH (ref 0.0–5.0)
Cholesterol, Total: 228 mg/dL — ABNORMAL HIGH (ref 100–199)
HDL: 41 mg/dL (ref >39)
LDL Chol Calc (NIH): 123 mg/dL — ABNORMAL HIGH (ref 0–99)
Triglycerides: 360 mg/dL — ABNORMAL HIGH (ref 0–149)
VLDL Cholesterol Cal: 64 mg/dL — ABNORMAL HIGH (ref 5–40)

## 2019-09-27 LAB — HEPATITIS C ANTIBODY: Hep C Virus Ab: <0.1 s/co ratio (ref 0.0–0.9)

## 2019-09-29 ENCOUNTER — Telehealth: Payer: Self-pay | Admitting: Pharmacist

## 2019-09-29 NOTE — Telephone Encounter (Signed)
Patient returned call following attempt to contact by Cordella Register, PharmD candidate.   Patient reports no insurance at this time.  States he is working through the "disability" process with a Clinical research associate.   He has minimal financial resources.    He has been without therapy for several years per his report.  He also states he has a supply of onetouch verio strips but does not have a meter.   I asked him to come to Rx Clinic 8/31 at 8:30 AM.   We will discuss options at that time.

## 2019-09-30 ENCOUNTER — Ambulatory Visit (INDEPENDENT_AMBULATORY_CARE_PROVIDER_SITE_OTHER): Payer: BLUE CROSS/BLUE SHIELD | Admitting: Pharmacist

## 2019-09-30 ENCOUNTER — Other Ambulatory Visit: Payer: Self-pay

## 2019-09-30 DIAGNOSIS — I252 Old myocardial infarction: Secondary | ICD-10-CM

## 2019-09-30 DIAGNOSIS — I10 Essential (primary) hypertension: Secondary | ICD-10-CM

## 2019-09-30 DIAGNOSIS — E7800 Pure hypercholesterolemia, unspecified: Secondary | ICD-10-CM

## 2019-09-30 DIAGNOSIS — E118 Type 2 diabetes mellitus with unspecified complications: Secondary | ICD-10-CM

## 2019-09-30 DIAGNOSIS — Z794 Long term (current) use of insulin: Secondary | ICD-10-CM

## 2019-09-30 MED ORDER — ATORVASTATIN CALCIUM 40 MG PO TABS: 40.0000 mg | ORAL_TABLET | Freq: Every day | ORAL | 11 refills | Status: DC

## 2019-09-30 MED ORDER — TRESIBA FLEXTOUCH 100 UNIT/ML ~~LOC~~ SOPN: 30.0000 [IU] | PEN_INJECTOR | Freq: Every day | SUBCUTANEOUS | 0 refills | Status: AC

## 2019-09-30 MED ORDER — OZEMPIC (0.25 OR 0.5 MG/DOSE) 2 MG/1.5ML ~~LOC~~ SOPN: 0.2500 mg | PEN_INJECTOR | SUBCUTANEOUS | 0 refills | Status: DC

## 2019-09-30 MED ORDER — METOPROLOL SUCCINATE ER 50 MG PO TB24: 50.0000 mg | ORAL_TABLET | Freq: Every day | ORAL | 11 refills | Status: DC

## 2019-09-30 MED ORDER — AMLODIPINE BESYLATE 10 MG PO TABS: 10.0000 mg | ORAL_TABLET | Freq: Every day | ORAL | 11 refills | Status: DC

## 2019-09-30 NOTE — Patient Instructions (Addendum)
Nice meeting you today!   1) Move Tresiba to the morning. Increase your insulin by 1 unit per day if your blood sugar is >100.   2) Start Ozempic 0.25 mg once a week.   3) Start atorvastatin 40 mg daily. We have sent this medication to your pharmacy.  4) Continue amlodipine 10 mg daily and metoprolol succinate 50 mg daily. We have sent these two medications to your pharmacy.

## 2019-09-30 NOTE — Progress Notes (Signed)
S:     Chief Complaint  Patient presents with  . Medication Management    Diabetes     Patient arrives in good spirits.  Presents for diabetes evaluation, education, and management. Patient is currently without insurance, and is working through process to obtain disability. Resultantly, patient has been without diabetes care, up until visit with Dr. Homero Fellers on 09/26/2019. In the distant past, patient was on Novolin R 30 units QHS, though patient has been without insulin for > 5 months. After visit with Dr. Homero Fellers, patient started on Tresiba, which he reports now taking. Dr. Homero Fellers has consulted pharmacy to manage diabetes and assist in management of mediations given barriers of cost. Patient reports not having many of his medications, and does not currently have a working glucometer. Patient uses Psych[facility], forensic.  Patient was referred and last seen by Primary Care Provider on 09/26/2019.   Diabetes was first reported in 2012.    Family/Social History: wife passed 1 year ago, after health challenges that caused many trips to the hospital. Patient is still in process of grief.   Insurance coverage/medication affordability: Uninsured; working with lawyer to obtain disability  Medication adherence reported suboptimal, as patient has trouble affording medications.   Current diabetes medications include: metformin 1000 mg BID with meals, Tresiba 30 units QHS Current hypertension medications include: metoprolol succinate 50 mg QHS, amlodipine 10 mg once daily; by patient report; patient reports not taking these medications due to lack of supply Current hyperlipidemia medications include: none; patient inquires about supplements to decrease cholesterol  Current pertinent anxiety/depression agents: fluoxetine 20 mg daily. Patient not currently taking, cannot afford.  Pertinent past diabetes medications: Novolin R 30 units QHS, Victoza (unknown dose; tolerated well)  Patient denies recent  hypoglycemic events. Last event occurred 3-4 years ago, for which patient reports eating sugar.  Patient reported dietary habits: Eats 2-3 meals/day; largest meal of the day is breakfast; sometimes skips lunch Breakfast: bacon and eggs. Had eggs only this morning for breakfast.  Lunch: yogurt or fruit Dinner: lean bowls; occassionally eats chicken and rice Snacks: potato chips Drinks: drinking sodas; now diet coke  Patient-reported exercise habits: ambulates with cane   Patient reports nocturia (nighttime urination). Patient reports waking up 3-4 X/ night with need to urinate. Reports concurrent symptoms of difficulty in urine stream initiation and low urine volume. Would like for Dr. Homero Fellers to check his prostate.   Patient reports neuropathy (nerve pain), of which is distressing to patient. Patient reports burning and shooting pain.  Patient reports visual changes. -Patient reports blurry vision and floaters. Reports having trouble reading and feels his vision is overall declining.   Patient reports self foot exams. Reports lack of feeling in feet.   Smoking history: Reports he quit smoking 1 month ago, though still smokes occasionally when stressed. Is not currently purchasing his own cigarettes, receives from neighbor.    O:  Physical Exam Vitals reviewed.  Constitutional:      Appearance: He is obese.  Cardiovascular:     Rate and Rhythm: Tachycardia present.  Neurological:     Mental Status: He is alert.  Psychiatric:        Mood and Affect: Mood normal.      Review of Systems  Eyes: Positive for blurred vision (Blurry vision with floaters).  Genitourinary: Positive for frequency and urgency.  Neurological: Positive for tingling (Patient reports burning and shooting pain in legs.).     Lab Results  Component Value  Date   HGBA1C 12.3 (A) 09/26/2019   There were no vitals filed for this visit.  Lipid Panel     Component Value Date/Time   CHOL 228 (H)  09/26/2019 1350   TRIG 360 (H) 09/26/2019 1350   HDL 41 09/26/2019 1350   CHOLHDL 5.6 (H) 09/26/2019 1350   LDLCALC 123 (H) 09/26/2019 1350   LDLDIRECT 97 10/16/2008 2230    Home fasting blood sugars: N/A, patient does not have working glucometer  2 hour post-meal/random blood sugars:  N/A, patient does not have working glucometer In-office BG: 177 (ate eggs for breakfast before coming into clinic)  A1c: 12.3 (09/26/2019)  Clinical Atherosclerotic Cardiovascular Disease (ASCVD): Yes  ; history of MI  The 10-year ASCVD risk score Denman George DC Jr., et al., 2013) is: 29.5%   Values used to calculate the score:     Age: 59 years     Sex: Male     Is Non-Hispanic African American: Yes     Diabetic: Yes     Tobacco smoker: Yes     Systolic Blood Pressure: 108 mmHg     Is BP treated: Yes     HDL Cholesterol: 41 mg/dL     Total Cholesterol: 228 mg/dL     A/P: Diabetes longstanding currently uncontrolled. Patient is able to verbalize appropriate hypoglycemia management plan. Medication adherence appears supoptimal. Control is suboptimal due to social determinants of health barriers, including last of insurance and cost barriers. Provided patient working glucometer, [employer], and educated on use. Patient PPBG in goal (<180) during clinic visit. Unknown PPBG throughout the day, and unknown FBG. As morning insulin regimens may result in better control throughout the day with reduced risk of nighttime hypoglycemia, moved [place] from QHS to QAM with breakfast. Due to patient BMI>30, and elevated A1c, also initiated Ozempic 0.25 mg once weekly; potentially titrate to 0.5 mg in 2-4 weeks based on presence or absence of GI AE (nausea, diarrhea, acid reflux). Sample Ozempic pen provided, and first dose administered in office. Due to LDL>70 and presence of ASCVD, started high intensity statin. Atorvastatin 40 mg daily started, as this medication is on the low-cost list at [facility]. Patient reports taking  metoprolol succinate, and amlodipine for blood pressure, of which he is currently noncompliant to, due to a lack of supply. Sent in metoprolol succinate 50mg  daily and amlodipine 10 gm daily to pharmacy. These medications are also on low-cost list at [facility].  -Start checking blood glucose 4 times daily with OneTouch Verio. Once in the morning before breakfast, and after each meal.  -Adjusted basal insulin Tresiba (insulin degludec). Patient will take Tresiba 30 units daily in the morning with breakfast. Patient will continue to titrate 1 unit everyday if fasting blood sugar > 100mg /dl until next visit with Dr. [facility] in 1 week.   -Started GLP-1 Ozempic 0.25 mg (semaglutide) once weekly. Sample pen provided. First dose administered in office. Patient exhibited adequate injection technique. -Extensively discussed pathophysiology of diabetes, recommended lifestyle interventions, dietary effects on blood sugar control -Counseled on s/sx of and management of hypoglycemia -Next A1C anticipated in 3 months (November 2021).  -ASCVD risk - secondary prevention in patient with diabetes. Last LDL is not controlled. ASCVD risk score is >20%  - high intensity statin indicated. Aspirin may be indicated for future initiation.  -Started atorvastatin 40 mg daily. Follow up lipid panel including LDL in 3 months.  Hypertension longstanding currently controlled, though patient without supply of medication.  Blood pressure goal =  130/80 mmHg. Medication adherence suboptimal.  Blood pressure control is suboptimal due to cost challenges and lack of supply. Sent in metoprolol succinate 50 mg QHS and amlodipine 10 mg QHS to patient's pharmacy.   At follow-up, consider adding rapid acting insuln to patients largest meal of the day and continue titrating [place]. Also consider starting an ACE/ARB for renal and cardioprotective effects and consider switching fluoxetine to duloxetine for further neuropathy pain  relief.     Written patient instructions provided.  Total time in face to face counseling 90 minutes.   Follow up PCP Clinic Visit in 1 week. Follow up Pharmacist Clinic Visit in 2 weeks..   Patient seen with Cordella Register, PharmD Candidate and Pervis Hocking, PharmD - PGY-1 Resident.

## 2019-09-30 NOTE — Progress Notes (Signed)
Reviewed: I agree with Dr. Koval's documentation and management. 

## 2019-09-30 NOTE — Assessment & Plan Note (Signed)
Hypertension longstanding currently controlled, though patient without supply of medication.  Blood pressure goal = 130/80 mmHg. Medication adherence suboptimal.  Blood pressure control is suboptimal due to cost challenges and lack of supply. Sent in metoprolol succinate 50 mg QHS and amlodipine 10 mg QHS to patient's pharmacy.

## 2019-09-30 NOTE — Assessment & Plan Note (Signed)
Diabetes longstanding currently uncontrolled. Patient is able to verbalize appropriate hypoglycemia management plan. Medication adherence appears supoptimal. Control is suboptimal due to social determinants of health barriers, including last of insurance and cost barriers. Provided patient working glucometer, [employer], and educated on use. Patient PPBG in goal (<180) during clinic visit. Unknown PPBG throughout the day, and unknown FBG. As morning insulin regimens may result in better control throughout the day with reduced risk of nighttime hypoglycemia, moved [place] from QHS to QAM with breakfast. Due to patient BMI>30, and elevated A1c, also initiated Ozempic 0.25 mg once weekly; potentially titrate to 0.5 mg in 2-4 weeks based on presence or absence of GI AE (nausea, diarrhea, acid reflux). Sample Ozempic pen provided, and first dose administered in office. Due to LDL>70 and presence of ASCVD, started high intensity statin. Atorvastatin 40 mg daily started, as this medication is on the low-cost list at [facility]. Patient reports taking metoprolol succinate, and amlodipine for blood pressure, of which he is currently noncompliant to, due to a lack of supply. Sent in metoprolol succinate 50mg  daily and amlodipine 10 gm daily to pharmacy. These medications are also on low-cost list at [facility].  -Start checking blood glucose 4 times daily with OneTouch Verio. Once in the morning before breakfast, and after each meal.  -Adjusted basal insulin Tresiba (insulin degludec). Patient will take Tresiba 30 units daily in the morning with breakfast. Patient will continue to titrate 1 unit everyday if fasting blood sugar > 100mg /dl until next visit with Dr. [facility] in 1 week.   -Started GLP-1 Ozempic 0.25 mg (semaglutide) once weekly. Sample pen provided. First dose administered in office. Patient exhibited adequate injection technique. -Extensively discussed pathophysiology of diabetes, recommended lifestyle  interventions, dietary effects on blood sugar control -Counseled on s/sx of and management of hypoglycemia

## 2019-09-30 NOTE — Assessment & Plan Note (Signed)
-  ASCVD risk - secondary prevention in patient with diabetes. Last LDL is not controlled. ASCVD risk score is >20%  - high intensity statin indicated. Aspirin may be indicated for future initiation.  °-Started atorvastatin 40 mg daily. Follow up lipid panel including LDL in 3 months. °

## 2019-09-30 NOTE — Assessment & Plan Note (Signed)
-  ASCVD risk - secondary prevention in patient with diabetes. Last LDL is not controlled. ASCVD risk score is >20%  - high intensity statin indicated. Aspirin may be indicated for future initiation.  -Started atorvastatin 40 mg daily. Follow up lipid panel including LDL in 3 months.

## 2019-10-01 ENCOUNTER — Encounter: Payer: Self-pay | Admitting: Family Medicine

## 2019-10-07 ENCOUNTER — Ambulatory Visit: Payer: BLUE CROSS/BLUE SHIELD | Admitting: Family Medicine

## 2019-10-08 ENCOUNTER — Encounter: Payer: Self-pay | Admitting: Family Medicine

## 2019-10-08 ENCOUNTER — Ambulatory Visit (INDEPENDENT_AMBULATORY_CARE_PROVIDER_SITE_OTHER): Payer: BLUE CROSS/BLUE SHIELD | Admitting: Family Medicine

## 2019-10-08 ENCOUNTER — Other Ambulatory Visit: Payer: Self-pay | Admitting: Family Medicine

## 2019-10-08 ENCOUNTER — Other Ambulatory Visit: Payer: Self-pay

## 2019-10-08 VITALS — BP 90/70 | HR 96 | Ht 67.0 in | Wt 216.2 lb

## 2019-10-08 DIAGNOSIS — E7800 Pure hypercholesterolemia, unspecified: Secondary | ICD-10-CM

## 2019-10-08 DIAGNOSIS — R350 Frequency of micturition: Secondary | ICD-10-CM

## 2019-10-08 DIAGNOSIS — I10 Essential (primary) hypertension: Secondary | ICD-10-CM

## 2019-10-08 DIAGNOSIS — N401 Enlarged prostate with lower urinary tract symptoms: Secondary | ICD-10-CM

## 2019-10-08 DIAGNOSIS — Z794 Long term (current) use of insulin: Secondary | ICD-10-CM

## 2019-10-08 DIAGNOSIS — F4321 Adjustment disorder with depressed mood: Secondary | ICD-10-CM

## 2019-10-08 DIAGNOSIS — N400 Enlarged prostate without lower urinary tract symptoms: Secondary | ICD-10-CM | POA: Insufficient documentation

## 2019-10-08 DIAGNOSIS — E118 Type 2 diabetes mellitus with unspecified complications: Secondary | ICD-10-CM

## 2019-10-08 LAB — POCT UA - MICROALBUMIN
Albumin/Creatinine Ratio, Urine, POC: <30
Creatinine, POC: 300 mg/dL
Microalbumin Ur, POC: 30 mg/L

## 2019-10-08 MED ORDER — TAMSULOSIN HCL 0.4 MG PO CAPS: 0.4000 mg | ORAL_CAPSULE | Freq: Every day | ORAL | 3 refills | Status: DC

## 2019-10-08 MED ORDER — LISINOPRIL 2.5 MG PO TABS: 2.5000 mg | ORAL_TABLET | Freq: Every day | ORAL | 3 refills | Status: DC

## 2019-10-08 MED ORDER — METFORMIN HCL ER 500 MG PO TB24: 1000.0000 mg | ORAL_TABLET | Freq: Two times a day (BID) | ORAL | 3 refills | Status: DC

## 2019-10-08 NOTE — Assessment & Plan Note (Signed)
-  Start taking Prozac -Establish care with a therapist

## 2019-10-08 NOTE — Assessment & Plan Note (Signed)
-  Follow-up PSA -Start tamsulosin once he can afford medication

## 2019-10-08 NOTE — Assessment & Plan Note (Signed)
Start atorvastatin once he can afford medication

## 2019-10-08 NOTE — Progress Notes (Signed)
    SUBJECTIVE:   CHIEF COMPLAINT / HPI:   Financial concerns Due to financial difficulties Mr. Haden has not been able to afford any of his prescribed medications since our previous visit.  The only medications he has been taking include Tresiba, semaglutide and Metformin.  He has been given the form for the orange card but has not yet filled out.  Diabetes Currently taking: -Metformin 1000 mg twice daily-some stomach upset -tresiba -semaglutide 0.25 mg weekly -atorvastatin 40 mg daily (not currently taking) -Not currently on an ACE or ARB Fasting blood sugars range from 120-220.  Most blood sugars between 140-170.  Grief He continues to note significant symptoms from his depression.  He has not yet started taking Prozac because he is not picked up any of his medications yet.  He has not yet established care with a therapist although he has a therapist picked out where he plans to establish care.  Hypertension Not currently taking any medication although he is prescribed amlodipine and metoprolol.  BPH Per the AUA BPH questionnaire, he is experiencing severe symptoms of BPH which include urinary frequency, weak stream, incomplete voiding and waking frequently at night for urination.  He has not previously been treated for this issue.  PERTINENT  PMH / PSH: Hypertension, diabetes  OBJECTIVE:   BP 90/70   Pulse 96   Ht 5\' 7"  (1.702 m)   Wt 216 lb 4 oz (98.1 kg)   SpO2 93%   BMI 33.87 kg/m    General: Alert and cooperative and appears to be in no acute distress Pulm: Breathing comfortably on room air.  No respiratory distress. Abdomen: Bowel sounds normal. Abdomen soft and non-tender.  Extremities: No peripheral edema. Warm/ well perfused.  Strong radial pulse. Rectal: No nodules palpated over the prostate. Neuro: Cranial nerves grossly intact   ASSESSMENT/PLAN:   Essential hypertension, benign Well-controlled without medication. -Start lisinopril 2.5 mg for renal  protection -Continue metoprolol succinate for heart failure  Controlled diabetes mellitus type 2 with complications (HCC) -Urine microalbumin collected today -Continue Metformin-reordered as extended release formulation -Continue Tresiba -Continue semaglutide -Continue checking fasting CBGs  Hypercholesteremia Start atorvastatin once he can afford medication  BPH (benign prostatic hyperplasia) -Follow-up PSA -Start tamsulosin once he can afford medication  Grief -Start taking Prozac -Establish care with a therapist     03-05-1976, MD [facility]

## 2019-10-08 NOTE — Patient Instructions (Signed)
The most helpful thing that you can do is fill out your orange card application so that we can help provide financial assistance.  To the best of your ability, please try to pick up your medication so we can see if that medication is helpful and what more we can do to help you with your various medical issues.  Diabetes: You can finish up the Metformin that you have at home.  Once that is done, I have sent in a prescription for the extended release version of the medicine which has fewer GI side effects.  We have also started you on a baby dose of a blood pressure medication to help protect your kidneys.  There is no need to follow-up with Dr. Raymondo Band for diabetes or hypertension until you are able to fill your prescriptions.  I recommend scheduling your next appointment 1 week after you can start taking your medicine.  Grief: I recommend that you start taking the medication we discussed last time.  Please also establish care with a therapist.  Benign prostatic hyperplasia: We will get a blood test today.  I also recommend that you start taking a medication called Flomax.  Can take this once a day and should help with all of your symptoms.  Please follow-up with me in 2 months

## 2019-10-08 NOTE — Assessment & Plan Note (Signed)
-  Urine microalbumin collected today -Continue Metformin-reordered as extended release formulation -Continue Tresiba -Continue semaglutide -Continue checking fasting CBGs

## 2019-10-08 NOTE — Assessment & Plan Note (Signed)
Well-controlled without medication. -Start lisinopril 2.5 mg for renal protection -Continue metoprolol succinate for heart failure

## 2019-10-09 LAB — PSA: Prostate Specific Ag, Serum: 1.4 ng/mL (ref 0.0–4.0)

## 2019-11-10 ENCOUNTER — Ambulatory Visit (HOSPITAL_COMMUNITY): Payer: BLUE CROSS/BLUE SHIELD | Admitting: Licensed Clinical Social Worker

## 2019-11-18 ENCOUNTER — Telehealth: Payer: Self-pay | Admitting: Pharmacist

## 2019-11-18 DIAGNOSIS — Z794 Long term (current) use of insulin: Secondary | ICD-10-CM

## 2019-11-18 DIAGNOSIS — I10 Essential (primary) hypertension: Secondary | ICD-10-CM

## 2019-11-18 DIAGNOSIS — E118 Type 2 diabetes mellitus with unspecified complications: Secondary | ICD-10-CM

## 2019-11-18 DIAGNOSIS — E7800 Pure hypercholesterolemia, unspecified: Secondary | ICD-10-CM

## 2019-11-18 MED ORDER — TRESIBA FLEXTOUCH 100 UNIT/ML ~~LOC~~ SOPN: 32.0000 [IU] | PEN_INJECTOR | Freq: Every day | SUBCUTANEOUS | 3 refills | Status: DC

## 2019-11-18 MED ORDER — LISINOPRIL 2.5 MG PO TABS: 2.5000 mg | ORAL_TABLET | Freq: Every day | ORAL | 3 refills | Status: DC

## 2019-11-18 MED ORDER — METFORMIN HCL ER 500 MG PO TB24: 1000.0000 mg | ORAL_TABLET | Freq: Two times a day (BID) | ORAL | 3 refills | Status: DC

## 2019-11-18 MED ORDER — FLUOXETINE HCL 20 MG PO TABS: 20.0000 mg | ORAL_TABLET | Freq: Every day | ORAL | 11 refills | Status: DC

## 2019-11-18 MED ORDER — ATORVASTATIN CALCIUM 40 MG PO TABS: 40.0000 mg | ORAL_TABLET | Freq: Every day | ORAL | 11 refills | Status: DC

## 2019-11-18 MED ORDER — METOPROLOL SUCCINATE ER 50 MG PO TB24: 50.0000 mg | ORAL_TABLET | Freq: Every day | ORAL | 11 refills | Status: DC

## 2019-11-18 MED ORDER — GABAPENTIN 300 MG PO CAPS: ORAL_CAPSULE | ORAL | 11 refills | Status: DC

## 2019-11-18 MED ORDER — AMLODIPINE BESYLATE 10 MG PO TABS: 10.0000 mg | ORAL_TABLET | Freq: Every day | ORAL | 3 refills | Status: DC

## 2019-11-18 MED ORDER — OZEMPIC (0.25 OR 0.5 MG/DOSE) 2 MG/1.5ML ~~LOC~~ SOPN: 0.5000 mg | PEN_INJECTOR | SUBCUTANEOUS | 11 refills | Status: DC

## 2019-11-18 MED ORDER — TIZANIDINE HCL 4 MG PO TABS: 4.0000 mg | ORAL_TABLET | Freq: Two times a day (BID) | ORAL | 11 refills | Status: DC | PRN

## 2019-11-18 MED ORDER — TAMSULOSIN HCL 0.4 MG PO CAPS: 0.4000 mg | ORAL_CAPSULE | Freq: Every day | ORAL | 11 refills | Status: DC

## 2019-11-18 NOTE — Telephone Encounter (Signed)
Noted and agree. 

## 2019-11-18 NOTE — Telephone Encounter (Signed)
Patient calls and asks for help with new prescriptions for med assistance.  He asks for new prescriptions for all of his medications be sent to [facility] - in [place].    He correctly states that they will help with medication supply for an entire year.  I agreed to send current prescriptions for all maintenance medications with exception of tramadol and alprazolam.  He states he does not think he need the alprazolam any longer.   He states his acetaminophen is not completely controlling his pain and is willing to come back for reevaluation.   Patient states blood sugars are under excellent control with Ozempic 0.25mg  weekly and Tresiba 40 units daily.  He denied any GI side effects with starting dose of Ozempic.  Following discussion we agreed that a higher dose of Ozempic may be appropriate at next dose (next Tuesday).    He was instructed to take 0.5mg  of Ozempic weekly and Evaristo Bury will be reduced to 32 units daily.  He was comfortable with this plan.     He states he will plan to come in to the clinic in a few weeks once he receives his new med supply and has been taking meds for 7 days.    Medication Samples have been provided for the patient to pick-up later today.  Located in refrigerator - bottom shelf.   Drug name: Ozempic (semaglutide)          Qty: 3 pens  LOT: [id number]  Exp.Date: 11/29/2021  Dosing instructions: 0.5mg  once weekly   Drug name: Evaristo Bury       Strength: 100units/ml        Qty: 9 pens  LOT: [id number]  Exp.Date: 12/29/2020  Dosing instructions: 32 units once daily  The patient has been instructed regarding the correct time, dose, and frequency of taking this medication, including desired effects and most common side effects.   Madelon Lips 9:06 AM 11/18/2019

## 2019-12-18 NOTE — Telephone Encounter (Signed)
Patient returns call to nurse line stating that metoprolol succinate is not provided by [facility] [place]. Patient states that this organization only covers metoprolol tartrate.   Will forward to PCP and Dr. Raymondo Band to determine if switch is appropriate.   Veronda Prude, RN

## 2019-12-19 ENCOUNTER — Other Ambulatory Visit: Payer: Self-pay | Admitting: Pharmacist

## 2019-12-19 MED ORDER — GABAPENTIN 600 MG PO TABS: 600.0000 mg | ORAL_TABLET | Freq: Three times a day (TID) | ORAL | 11 refills | Status: DC

## 2019-12-19 MED ORDER — METOPROLOL TARTRATE 25 MG PO TABS: 25.0000 mg | ORAL_TABLET | Freq: Two times a day (BID) | ORAL | 3 refills | Status: DC

## 2019-12-19 MED ORDER — TIZANIDINE HCL 4 MG PO TABS: 4.0000 mg | ORAL_TABLET | Freq: Two times a day (BID) | ORAL | 11 refills | Status: DC | PRN

## 2019-12-19 MED FILL — GABAPENTIN 600 MG TABLET: 600 | 30 days supply | Qty: 90 | Fill #0

## 2019-12-19 MED FILL — tiZANidine HCL 4 MG TABS: 4 | 30 days supply | Qty: 60 | Fill #0

## 2019-12-19 NOTE — Telephone Encounter (Signed)
Rx's received.  Current total amount due is $14.

## 2019-12-19 NOTE — Telephone Encounter (Signed)
Contacted patient RE metoprolol succinate (50mg  once daily).   Sent new prescription metoprolol tartrate 25mg  BID to [facility] [facility] as requested.    Patient made aware of change and he was comfortable with this change.    Patient also requested two additional medications that were also not covered at [facility] Gabapentin and Tizanidine  Verified current doses and sent new prescriptions to [facility]  Asked patient to contact them prior to making the trip to verify the cost is acceptable.   I will include [facility] for additional assistance if patient cost is too high at [facility].

## 2019-12-19 NOTE — Addendum Note (Signed)
Addended by: Kathrin Ruddy on: 12/19/2019 01:24 PM   Modules accepted: Orders

## 2019-12-19 NOTE — Telephone Encounter (Signed)
Noted and agree. 

## 2020-03-05 ENCOUNTER — Ambulatory Visit: Payer: BLUE CROSS/BLUE SHIELD | Admitting: Pharmacist

## 2020-03-11 ENCOUNTER — Ambulatory Visit (INDEPENDENT_AMBULATORY_CARE_PROVIDER_SITE_OTHER): Payer: BLUE CROSS/BLUE SHIELD | Admitting: Pharmacist

## 2020-03-11 ENCOUNTER — Other Ambulatory Visit: Payer: Self-pay

## 2020-03-11 DIAGNOSIS — Z794 Long term (current) use of insulin: Secondary | ICD-10-CM

## 2020-03-11 DIAGNOSIS — I252 Old myocardial infarction: Secondary | ICD-10-CM

## 2020-03-11 DIAGNOSIS — E118 Type 2 diabetes mellitus with unspecified complications: Secondary | ICD-10-CM

## 2020-03-11 MED ORDER — TRESIBA FLEXTOUCH 100 UNIT/ML ~~LOC~~ SOPN: 40.0000 [IU] | PEN_INJECTOR | Freq: Every day | SUBCUTANEOUS | 3 refills | Status: DC

## 2020-03-11 MED ORDER — OZEMPIC (0.25 OR 0.5 MG/DOSE) 2 MG/1.5ML ~~LOC~~ SOPN: 0.2500 mg | PEN_INJECTOR | SUBCUTANEOUS | 11 refills | Status: DC

## 2020-03-11 MED FILL — GABAPENTIN 600 MG TABLET: 600 | 30 days supply | Qty: 90 | Fill #0

## 2020-03-11 MED FILL — tiZANidine HCL 4 MG TABS: 4 | 30 days supply | Qty: 60 | Fill #0

## 2020-03-11 NOTE — Progress Notes (Signed)
S:     Chief Complaint  Patient presents with   Medication Management    Diabetes    Patient arrives ambulating without assistance.  Presents for diabetes evaluation, education, and management  Patient was referred and last seen by Primary Care Provider on 10/08/2019. Last seen in Rx clinic 11/18/2019.  Patient reports Diabetes was diagnosed 13 years ago.   Insurance coverage/medication affordability: No insurance currently.  Attempting to gain disability/Medicaid.  Plans to refile for Medicaid/Disability in 03/2020  Medication adherence reported excellent.   Current diabetes medications include: Ozempic 0.25mg  weekly (unable to tolerate 0.5mg   Current hypertension medications include: metoprolol 25mg  BID (patient reports taking 50mg  BID?), prescribed but not currently taking amlodipine (states he just forgot the bottle recently) Current hyperlipidemia medications include: atorvastatin 40mg   daily  Patient denies hypoglycemic events.   Patient reports neuropathy (nerve pain). Controlled with use of Gabapentin Patient denies visual changes. Reports improved vision since last visit related to improved glucose control.  Patient reports self foot exams and wearing "house shoes".     O:  Physical Exam Vitals reviewed.  Constitutional:      Appearance: Normal appearance.  Pulmonary:     Effort: Pulmonary effort is normal.  Neurological:     Mental Status: He is alert.  Psychiatric:        Mood and Affect: Mood normal.      Review of Systems  Gastrointestinal: Positive for diarrhea (he relates to metformin use).  Neurological: Negative for tingling (tinglying and numbness in two fingers bilaterally) and sensory change (Numbness in bilateral feet).     Lab Results  Component Value Date   HGBA1C 12.3 (A) 09/26/2019   Vitals:   03/11/20 1607  Pulse: 88  SpO2: 97%    Lipid Panel     Component Value Date/Time   CHOL 228 (H) 09/26/2019 1350   TRIG 360 (H)  09/26/2019 1350   HDL 41 09/26/2019 1350   CHOLHDL 5.6 (H) 09/26/2019 1350   LDLCALC 123 (H) 09/26/2019 1350   LDLDIRECT 97 10/16/2008 2230    Home fasting blood sugars: reported as 100-200     Clinical Atherosclerotic Cardiovascular Disease (ASCVD): Yes  The 10-year ASCVD risk score 09/28/2019 DC Jr., et al., 2013) is: 22.6%   Values used to calculate the score:     Age: 60 years     Sex: Male     Is Non-Hispanic African American: Yes     Diabetic: Yes     Tobacco smoker: Yes     Systolic Blood Pressure: 90 mmHg     Is BP treated: Yes     HDL Cholesterol: 41 mg/dL     Total Cholesterol: 228 mg/dL    A/P: Diabetes longstanding with improved control using Ozempic (semaglutide), Tresiba (insulin Degludec) and Metformin.  Metformin has continued to cause diarrhea.  Patient was unable to increase dose of semaglutide from 0.25 to 0.5mg  weekly due to GI side effects.  Patient was able to verbalize appropriate hypoglycemia management plan. Medication adherence appears good.  - Discontinued metformin due to persistent GI intolerance.  Added med to intolerance (allergy) list.  -Continued basal insulin Tresiba  (insulin degludec). Patient will continue at 40 units once daily.  -Continued GLP-1 Ozempic (generic name semaglutide) at a dose of 0.25mg  weekly - asked to increase by 1 click per week and evaluate tolerability of slow titration.   -Extensively discussed pathophysiology of diabetes, recommended lifestyle interve ntions, dietary effects on blood sugar control -Significant  diabetic neuropathy bilateral feet - pain controlled with Gabapentin.   Samples of both [place] and Ozempic provided - adequate for 1 month supply.  - Reviewed s/sx of and management of hypoglycemia -Next A1C anticipated at next PCP visit with Dr. Homero Fellers within 4-5 weeks.   ASCVD risk -secondary prevention in patient with diabetes and history of MI. Last LDL was not controlled. Deferred evaluation as he does not currently  have insurance.   - high intensity statin indicated.  -Continued atorvastatin 40 mg.   History of tobacco abuse.  Reports significant reduction in tobacco intake.  States intake has been only 1 cigarette in the last 10 days.  He appears to be an excellent candidate for long-term abstinence.  Encouraged continued abstinence. Reevaluate success at next visit.   ADDED lisinopril to intolerance list due to COUGH.   Written patient instructions provided.  Total time in face to face counseling 40 minutes.   Follow up PCP, Dr. Homero Fellers in 1 month (likely he will need additional samples at that time). Rx Clinic Visit in 6-8 weeks per Dr. Cardell Peach request.   Patient seen with Michiel Cowboy, PharmD Candidate,  and Fabio Neighbors, PharmD, BCPS - PGY2 Pharmacy Resident.

## 2020-03-11 NOTE — Patient Instructions (Signed)
STOP Metformin  Continue Tresiba at 40 units daily.  Increase Ozempic by 1 click per week.   See Dr. Homero Fellers in 4-5 weeks.

## 2020-03-12 ENCOUNTER — Encounter: Payer: Self-pay | Admitting: Pharmacist

## 2020-03-12 NOTE — Progress Notes (Signed)
Reviewed: I agree with Dr. Koval's documentation and management. 

## 2020-03-12 NOTE — Assessment & Plan Note (Signed)
ASCVD risk -secondary prevention in patient with diabetes and history of MI. Last LDL was not controlled. Deferred evaluation as he does not currently have insurance.   - high intensity statin indicated.  -Continued atorvastatin 40 mg.

## 2020-03-12 NOTE — Assessment & Plan Note (Signed)
Diabetes longstanding with improved control using Ozempic (semaglutide), Tresiba (insulin Degludec) and Metformin.  Metformin has continued to cause diarrhea.  Patient was unable to increase dose of semaglutide from 0.25 to 0.5mg  weekly due to GI side effects.  Patient was able to verbalize appropriate hypoglycemia management plan. Medication adherence appears good.  -Continued basal insulin Tresiba  (insulin degludec). Patient will continue at 40 units once daily.  -Continued GLP-1 Ozempic (generic name semaglutide) at a dose of 0.25mg  weekly - asked to increase by 1 click per week and evaluate tolerability of slow titration.   -Extensively discussed pathophysiology of diabetes, recommended lifestyle interve ntions, dietary effects on blood sugar control -Significant diabetic neuropathy bilateral feet - pain controlled with Gabapentin.   Samples of both [place] and Ozempic provided - adequate for 1 month supply.  - Reviewed s/sx of and management of hypoglycemia -Next A1C anticipated at next PCP visit with Dr. Homero Fellers within 4-5 weeks.

## 2020-03-15 ENCOUNTER — Ambulatory Visit: Payer: BLUE CROSS/BLUE SHIELD | Admitting: Family Medicine

## 2020-03-30 DIAGNOSIS — Z1152 Encounter for screening for COVID-19: Secondary | ICD-10-CM | POA: Diagnosis not present

## 2020-04-15 ENCOUNTER — Telehealth: Payer: Self-pay | Admitting: *Deleted

## 2020-04-16 NOTE — Telephone Encounter (Signed)
Attempted to return call to patient RE request for Losartan through an indigent care process.   Left message as there was no answer, that I would try again on Monday.   I plan to contact him 04/19/2020

## 2020-04-16 NOTE — Telephone Encounter (Signed)
Patient calls nurse line regarding losartan rx. Patient states that he had discussed this medication with Dr. Raymondo Band, however, was not initiated due to patient not being able to afford medication.   Patient states that he is now established with medication assistance program and is interested in being started on Losartan. I am unable to find this in chart review.   Please advise.   Veronda Prude, RN

## 2020-04-19 NOTE — Telephone Encounter (Signed)
Contacted patient in follow-up of losartan request.   Patient reports approval for medications from [facility].  He states he has multiple medications that are in process and have been sent to him in the mail (has not yet received).   He requests consideration for losartan as a medication him to "prevent organ damage" as he and I had discussed in the past.    He has an appointment on Thursday 3/24 with PCP, Dr. Homero Fellers at 11:10  He and I agreed that a discussion on initiating this agent may need a lab and could happen at his visit on Thursday.    Consider Losartan at next PCP visit

## 2020-04-19 NOTE — Telephone Encounter (Signed)
Noted and agree. 

## 2020-04-20 MED FILL — tiZANidine HCL 4 MG TABS: 4 | 30 days supply | Qty: 60 | Fill #1

## 2020-04-20 MED FILL — GABAPENTIN 600 MG TABLET: 600 | 30 days supply | Qty: 90 | Fill #1

## 2020-04-22 ENCOUNTER — Encounter: Payer: Self-pay | Admitting: Family Medicine

## 2020-04-22 ENCOUNTER — Other Ambulatory Visit: Payer: Self-pay

## 2020-04-22 ENCOUNTER — Ambulatory Visit (INDEPENDENT_AMBULATORY_CARE_PROVIDER_SITE_OTHER): Payer: BLUE CROSS/BLUE SHIELD | Admitting: Family Medicine

## 2020-04-22 VITALS — BP 112/77 | HR 98 | Ht 68.0 in | Wt 233.1 lb

## 2020-04-22 DIAGNOSIS — E118 Type 2 diabetes mellitus with unspecified complications: Secondary | ICD-10-CM

## 2020-04-22 DIAGNOSIS — Z794 Long term (current) use of insulin: Secondary | ICD-10-CM

## 2020-04-22 DIAGNOSIS — I10 Essential (primary) hypertension: Secondary | ICD-10-CM

## 2020-04-22 DIAGNOSIS — R200 Anesthesia of skin: Secondary | ICD-10-CM

## 2020-04-22 DIAGNOSIS — G5601 Carpal tunnel syndrome, right upper limb: Secondary | ICD-10-CM

## 2020-04-22 DIAGNOSIS — F4321 Adjustment disorder with depressed mood: Secondary | ICD-10-CM

## 2020-04-22 DIAGNOSIS — R202 Paresthesia of skin: Secondary | ICD-10-CM

## 2020-04-22 LAB — POCT GLYCOSYLATED HEMOGLOBIN (HGB A1C): HbA1c, POC (controlled diabetic range): 6.5 % (ref 0.0–7.0)

## 2020-04-22 MED ORDER — OZEMPIC (0.25 OR 0.5 MG/DOSE) 2 MG/1.5ML ~~LOC~~ SOPN: 0.2500 mg | PEN_INJECTOR | SUBCUTANEOUS | 4 refills | Status: DC

## 2020-04-22 MED ORDER — TRESIBA FLEXTOUCH 100 UNIT/ML ~~LOC~~ SOPN
40.0000 [IU] | PEN_INJECTOR | Freq: Every day | SUBCUTANEOUS | 3 refills | Status: DC
Start: 2020-04-22 — End: 2020-06-16

## 2020-04-22 NOTE — Assessment & Plan Note (Signed)
Significantly improved. -Continue Prozac 20 mg daily

## 2020-04-22 NOTE — Progress Notes (Signed)
SUBJECTIVE:   CHIEF COMPLAINT / HPI:   Diabetes His current diabetic regimen includes: -semaglutide 0.5 milligrams weekly (he has been slowly titrating upward due to his GI upset 20 quickly titrated up to 0.5 mg) -degludec 40 units, maybe forgoes once per week due to low BG Fasting between 93-115.  He reports good dietary compliance in his mid a lot of strong dietary changes since her last visit.  Hypertension His current antihypertensive regimen includes: -Amlodipine 10 mg daily -Metoprolol 25 mg daily  Smoking cessation He reports that he is stop smoking cold [place] since our last visit.  He is not requesting any additional medication or help at this time.  Lower extremity diabetic neuropathy He reports that his neuropathy is drastically improved with the addition of gabapentin.  He is very pleased with the results.  Right hand neuropathy and middle finger weakness He is reported notable middle finger weakness and tingling in his thumb, index finger and middle finger of the right hand.  This is been going on for about 1 month.  He reports he has previously had issues with carpal tunnel in his right hand but that was many years ago.  He does not currently have a wrist splint.  Grief Mr. Pond reports enormous improvement since starting his Prozac this past fall.  He is very grateful for the changes medication is made in his life.  He scored 5 on his PHQ-9 today with a 0 for question 9.  PERTINENT  PMH / PSH: Hypertension, CHF, s/p MI, HLD, OSA  OBJECTIVE:   BP 112/77   Pulse 98   Ht 5\' 8"  (1.727 m)   Wt 233 lb 2 oz (105.7 kg)   SpO2 97%   BMI 35.45 kg/m   General: Alert and cooperative and appears to be in no acute distress Cardio: Normal S1 and S2, no S3 or S4. Rhythm is regular. No murmurs or rubs.   Pulm: Clear to auscultation bilaterally, no crackles, wheezing, or diminished breath sounds. Normal respiratory effort Abdomen: Bowel sounds normal. Abdomen soft and  non-tender.  Extremities: No peripheral edema. Warm/ well perfused.  Strong radial pulses.  Good pincher strength with his thumb and second finger of the right hand.  Poor pincher strength with his thumb and third finger of his right hand. Neuro: Cranial nerves grossly intact  ASSESSMENT/PLAN:   Essential hypertension, benign Well-controlled today. -Continue amlodipine 10 mg -Continue metoprolol 25 mg  Controlled diabetes mellitus type 2 with complications (HCC) Dramatically improved A1c today down to 6.5 from 12.3.  He was congratulated on this significant improvement.  We will continue to try to uptitrate his semaglutide and decrease his insulin requirement.  Sadly, he is not able to tolerate Metformin. -Continue semaglutide 0.5 mg weekly (moving the final click up to 0.5 mg tomorrow) -Decrease degludec to 30 units daily -Return to clinic in 3 months  Grief Significantly improved. -Continue Prozac 20 mg daily  Numbness and tingling in right hand He has some numbness and notable weakness with the pinching ability of his third finger.  I suspect this is primarily carpal tunnel although he may have a contribution from diabetic neuropathy as well because his lower extremity neuropathy is quite extensive.  We will start with 6 weeks of using a respond at night.  If this does not show improvement, we can consider a steroid injection or possibly referral to neurology for nerve conduction study.   Mr. Obar had additional questions about chronic dyspnea that we  did not have time to fully address today.  He was encouraged to return to clinic for a more thorough conversation about this issue.  Mirian Mo, MD [facility]

## 2020-04-22 NOTE — Patient Instructions (Signed)
Diabetes Good job lowering your A1c significantly.  I would like you to continue using your semaglutide 0.5 mg every week.  You can decrease your insulin to 30 units daily.  We will give you free samples today.  We might only be able to give you enough samples for 1 month but I would like to see you again in clinic anyway in about 1 month and so we can try to provide more samples at that time.  Neuropathy Continue taking the gabapentin like we discussed.  Blood pressure Your blood pressure looks good today continue taking your current blood pressure medication.  Carpal tunnel syndrome It does look like you may have some evidence of carpal tunnel syndrome of your right hand.  I recommend that you wear response at night for at least the next 6 weeks.  I am sorry did not have time to discuss the shortness of breath that you mentioned.  Like you to come back in the next month or so for a longer conversation.

## 2020-04-22 NOTE — Assessment & Plan Note (Signed)
Dramatically improved A1c today down to 6.5 from 12.3.  He was congratulated on this significant improvement.  We will continue to try to uptitrate his semaglutide and decrease his insulin requirement.  Sadly, he is not able to tolerate Metformin. -Continue semaglutide 0.5 mg weekly (moving the final click up to 0.5 mg tomorrow) -Decrease degludec to 30 units daily -Return to clinic in 3 months

## 2020-04-22 NOTE — Assessment & Plan Note (Addendum)
He has some numbness and notable weakness with the pinching ability of his third finger.  I suspect this is primarily carpal tunnel although he may have a contribution from diabetic neuropathy as well because his lower extremity neuropathy is quite extensive.  We will start with 6 weeks of using a respond at night.  If this does not show improvement, we can consider a steroid injection or possibly referral to neurology for nerve conduction study.

## 2020-04-22 NOTE — Assessment & Plan Note (Signed)
Well-controlled today. -Continue amlodipine 10 mg -Continue metoprolol 25 mg

## 2020-06-04 ENCOUNTER — Other Ambulatory Visit: Payer: Self-pay

## 2020-06-04 MED FILL — Tizanidine HCl Tab 4 MG (Base Equivalent): ORAL | 30 days supply | Qty: 60 | Fill #0 | Status: AC

## 2020-06-04 MED FILL — Gabapentin Tab 600 MG: ORAL | 30 days supply | Qty: 90 | Fill #0 | Status: AC

## 2020-06-07 ENCOUNTER — Other Ambulatory Visit: Payer: Self-pay

## 2020-06-07 ENCOUNTER — Ambulatory Visit
Admission: RE | Admit: 2020-06-07 | Discharge: 2020-06-07 | Disposition: A | Discharge status: Home / Self Care | Payer: Medicaid Other | Source: Ambulatory Visit | Attending: Family Medicine | Admitting: Family Medicine

## 2020-06-07 ENCOUNTER — Encounter: Payer: Self-pay | Admitting: Family Medicine

## 2020-06-07 ENCOUNTER — Ambulatory Visit (INDEPENDENT_AMBULATORY_CARE_PROVIDER_SITE_OTHER): Payer: BLUE CROSS/BLUE SHIELD | Admitting: Family Medicine

## 2020-06-07 VITALS — BP 104/60 | HR 94 | Ht 68.0 in | Wt 242.2 lb

## 2020-06-07 DIAGNOSIS — R5383 Other fatigue: Secondary | ICD-10-CM | POA: Insufficient documentation

## 2020-06-07 DIAGNOSIS — Z8679 Personal history of other diseases of the circulatory system: Secondary | ICD-10-CM

## 2020-06-07 NOTE — Progress Notes (Signed)
    SUBJECTIVE:   CHIEF COMPLAINT / HPI:   Fatigue Mr. Groninger reports that he has been having trouble with low energy and shortness of breath.  This is been getting worse in the past 2 months.  Most notably, his exercise tolerance has decreased.  Previously, he felt comfortable walking all the way around his block with his dog.  Now he can make it about one quarter of the way around his block before needing to take a break.  He reports that he is able to sleep comfortably on his side while lying flat on his bed.  If he lies flat on his back, he does have a little bit of difficulty breathing.  He notes that he has a history of heart trouble and wants to be sure that this does not indicate any worsening heart issues.  He specifically denies chest pain and palpitations.  PERTINENT  PMH / PSH: Heart failure (no echocardiograms available through his chart)  OBJECTIVE:   BP 104/60   Pulse 94   Ht 5\' 8"  (1.727 m)   Wt 242 lb 4 oz (109.9 kg)   BMI 36.83 kg/m    General: Alert and cooperative and appears to be in no acute distress HEENT: No elevated JVD Cardio: Normal S1 and S2, no S3 or S4. Rhythm is regular. No murmurs or rubs.   Pulm: Clear to auscultation bilaterally, no crackles, wheezing, or diminished breath sounds. Normal respiratory effort Abdomen: Bowel sounds normal. Abdomen soft and non-tender.  Extremities: No peripheral edema. Warm/ well perfused.  Strong radial pulses. Neuro: Cranial nerves grossly intact  ASSESSMENT/PLAN:   Fatigue Unclear etiology.  I am concerned this may indicate worsening heart failure.  Sadly, per chart review, there is no significant information available about his previously documented heart failure.  Due to his lack of insurance, he cannot afford an echocardiogram at this time.  We will move forward with blood work and a chest x-ray.  I have asked him to return to clinic in 1 week for discussion regarding his labs.  His current medication regimen is not  optimized for heart failure and it may be beneficial to discontinue his amlodipine in favor of an ACE/ARB and possibly some diuresis.  His physical exam is not grossly overloaded and I do not think he warrants diuresis at this time.  If his BNP and chest x-ray shows evidence of fluid overload, may be helpful to consider diuresis at that time.     , MD [facility]

## 2020-06-07 NOTE — Patient Instructions (Signed)
Fatigue: I am not positive what is causing your fatigue.  I think it is a good idea to take a heart or look at your heart.  Today, we will get some blood work and I have ordered a chest x-ray.  I would like you to come back to clinic in 1 week we can talk about your lab results and making some medication changes.

## 2020-06-07 NOTE — Assessment & Plan Note (Signed)
Unclear etiology.  I am concerned this may indicate worsening heart failure.  Sadly, per chart review, there is no significant information available about his previously documented heart failure.  Due to his lack of insurance, he cannot afford an echocardiogram at this time.  We will move forward with blood work and a chest x-ray.  I have asked him to return to clinic in 1 week for discussion regarding his labs.  His current medication regimen is not optimized for heart failure and it may be beneficial to discontinue his amlodipine in favor of an ACE/ARB and possibly some diuresis.  His physical exam is not grossly overloaded and I do not think he warrants diuresis at this time.  If his BNP and chest x-ray shows evidence of fluid overload, may be helpful to consider diuresis at that time.

## 2020-06-08 LAB — COMPREHENSIVE METABOLIC PANEL
ALT: 38 IU/L (ref 0–44)
AST: 22 IU/L (ref 0–40)
Albumin/Globulin Ratio: 1 — ABNORMAL LOW (ref 1.2–2.2)
Albumin: 4.3 g/dL (ref 3.8–4.9)
Alkaline Phosphatase: 108 IU/L (ref 44–121)
BUN/Creatinine Ratio: 17 (ref 9–20)
BUN: 11 mg/dL (ref 6–24)
Bilirubin Total: 0.3 mg/dL (ref 0.0–1.2)
CO2: 24 mmol/L (ref 20–29)
Calcium: 9.1 mg/dL (ref 8.7–10.2)
Chloride: 100 mmol/L (ref 96–106)
Creatinine, Ser: 0.65 mg/dL — ABNORMAL LOW (ref 0.76–1.27)
Globulin, Total: 4.1 g/dL (ref 1.5–4.5)
Glucose: 113 mg/dL — ABNORMAL HIGH (ref 65–99)
Potassium: 4.1 mmol/L (ref 3.5–5.2)
Sodium: 137 mmol/L (ref 134–144)
Total Protein: 8.4 g/dL (ref 6.0–8.5)
eGFR: 109 mL/min/{1.73_m2} (ref >59)

## 2020-06-08 LAB — TSH: TSH: 1.53 u[IU]/mL (ref 0.450–4.500)

## 2020-06-08 LAB — CBC
Hematocrit: 40.7 % (ref 37.5–51.0)
Hemoglobin: 13.5 g/dL (ref 13.0–17.7)
MCH: 29 pg (ref 26.6–33.0)
MCHC: 33.2 g/dL (ref 31.5–35.7)
MCV: 87 fL (ref 79–97)
Platelets: 274 10*3/uL (ref 150–450)
RBC: 4.66 x10E6/uL (ref 4.14–5.80)
RDW: 13.3 % (ref 11.6–15.4)
WBC: 12.7 10*3/uL — ABNORMAL HIGH (ref 3.4–10.8)

## 2020-06-08 LAB — BRAIN NATRIURETIC PEPTIDE: BNP: 14.1 pg/mL (ref 0.0–100.0)

## 2020-06-16 ENCOUNTER — Other Ambulatory Visit: Payer: Self-pay

## 2020-06-16 ENCOUNTER — Encounter: Payer: Self-pay | Admitting: Family Medicine

## 2020-06-16 ENCOUNTER — Ambulatory Visit (INDEPENDENT_AMBULATORY_CARE_PROVIDER_SITE_OTHER): Payer: BLUE CROSS/BLUE SHIELD | Admitting: Family Medicine

## 2020-06-16 VITALS — BP 105/64 | HR 91 | Ht 68.0 in | Wt 242.5 lb

## 2020-06-16 DIAGNOSIS — K219 Gastro-esophageal reflux disease without esophagitis: Secondary | ICD-10-CM | POA: Insufficient documentation

## 2020-06-16 DIAGNOSIS — F4321 Adjustment disorder with depressed mood: Secondary | ICD-10-CM

## 2020-06-16 DIAGNOSIS — E118 Type 2 diabetes mellitus with unspecified complications: Secondary | ICD-10-CM

## 2020-06-16 DIAGNOSIS — Z794 Long term (current) use of insulin: Secondary | ICD-10-CM

## 2020-06-16 DIAGNOSIS — R5383 Other fatigue: Secondary | ICD-10-CM

## 2020-06-16 DIAGNOSIS — R6889 Other general symptoms and signs: Secondary | ICD-10-CM

## 2020-06-16 MED ORDER — TRESIBA FLEXTOUCH 100 UNIT/ML ~~LOC~~ SOPN: 40.0000 [IU] | PEN_INJECTOR | Freq: Every day | SUBCUTANEOUS | 3 refills | Status: DC

## 2020-06-16 MED ORDER — OMEPRAZOLE 40 MG PO CPDR: 40.0000 mg | DELAYED_RELEASE_CAPSULE | Freq: Every day | ORAL | 3 refills | Status: DC

## 2020-06-16 MED ORDER — FLUOXETINE HCL 20 MG PO TABS: 40.0000 mg | ORAL_TABLET | Freq: Every day | ORAL | 11 refills | Status: DC

## 2020-06-16 NOTE — Assessment & Plan Note (Signed)
Omeprazole refilled.

## 2020-06-16 NOTE — Assessment & Plan Note (Signed)
Overall, blood work and chest x-ray is reassuring.  No evidence of heart failure at this time.  I am interested in further assessment for coronary artery disease.  His risk factors include hypertension, diabetes, hyperlipidemia.  He does not have insurance which may lead to issues being seen by cardiology.  I will defer to our referral coordinator for best placement for him. -Follow-up vitamin D and B12 -Placed ambulatory referral to cardiology

## 2020-06-16 NOTE — Patient Instructions (Signed)
It was great to see you today.  Here is a quick review of the things we talked about:  Decreased exercise tolerance: Regular get a little bit of additional lab work today but I would like you to see cardiology for further evaluation of your heart.  I have placed a referral to cardiology.  You should get a call in the next 1-2 weeks to set up your appointment.  Please let me know if you have not received a call in the next 2 weeks.  GERD: I have refilled your omeprazole  Anxiety: I think it be a good idea to increase your fluoxetine to 40 mg daily.  You can start taking 2 pills of what you have at home.  I have also sent in refills for you.  There are no 40 mg tablets so you will continue to need to take 2 pills at a time.  If all of your labs are normal, I will send you a message over my chart or send you a letter.  If there is anything to discuss, I will give you a phone call.  I would like to see you again in clinic once you have been seen by cardiology.

## 2020-06-16 NOTE — Assessment & Plan Note (Signed)
Last A1c 6.5.  No trouble tolerating his medications at this time.  This is likely contributing to his above-noted fatigue/decreased exercise tolerance.  Although, his improved A1c will hopefully lead to more energy and salubrious wellbeing. -Provided Degele deck samples in clinic -Follow-up in the next 2-3 months

## 2020-06-16 NOTE — Progress Notes (Signed)
    SUBJECTIVE:   CHIEF COMPLAINT / HPI:   Fatigue No significant changes since her last visit.  He continues to note shortness of breath on exertion.  He continues to deny chest pain or palpitations.  Anxiety He has been taking fluoxetine 20 mg daily for anxiety which is helpful but he still notices significant symptoms.  He would like a benzodiazepine for his continued anxiety symptoms.  Diabetes His current medications include: -Degludec 40 notes daily -Semaglutide 0.25 mg weekly injections He reports that he only has 1.5 pens of diclofenac left and would like additional samples if possible.  GERD He reports that he has been having significant symptoms of GERD including fullness and burning in his upper middle stomach following meals when he lies down at night.  He has previously had good success with omeprazole and was hoping to get started again on this medication.  PERTINENT  PMH / PSH: Retention, diabetes, hypercholesterolemia  OBJECTIVE:   BP 105/64   Pulse 91   Ht 5\' 8"  (1.727 m)   Wt 242 lb 8 oz (110 kg)   SpO2 96%   BMI 36.87 kg/m   General: Alert and cooperative and appears to be in no acute distress Cardio: Normal S1 and S2, no S3 or S4. Rhythm is regular. No murmurs or rubs.   Pulm: Clear to auscultation bilaterally, no crackles, wheezing, or diminished breath sounds. Normal respiratory effort Abdomen: Bowel sounds normal. Abdomen soft and non-tender.  Extremities: No peripheral edema. Warm/ well perfused.  Strong radial pulses. Neuro: Cranial nerves grossly intact  ASSESSMENT/PLAN:   Controlled diabetes mellitus type 2 with complications (HCC) Last A1c 6.5.  No trouble tolerating his medications at this time.  This is likely contributing to his above-noted fatigue/decreased exercise tolerance.  Although, his improved A1c will hopefully lead to more energy and salubrious wellbeing. -Provided Degele deck samples in clinic -Follow-up in the next 2-3  months  Fatigue Overall, blood work and chest x-ray is reassuring.  No evidence of heart failure at this time.  I am interested in further assessment for coronary artery disease.  His risk factors include hypertension, diabetes, hyperlipidemia.  He does not have insurance which may lead to issues being seen by cardiology.  I will defer to our referral coordinator for best placement for him. -Follow-up vitamin D and B12 -Placed ambulatory referral to cardiology  GERD (gastroesophageal reflux disease) -Omeprazole refilled  Grief Grief/anxiety.  Today, he reports more symptoms of anxiety.  We discussed that benzodiazepines are not the best medication for him at this time.  For now, we will increase his Prozac to 40 mg daily and have him follow-up in the next 1-2 months. -Increase fluoxetine to 40 mg -Follow-up in 1-2 months     , MD [facility]

## 2020-06-16 NOTE — Assessment & Plan Note (Signed)
Grief/anxiety.  Today, he reports more symptoms of anxiety.  We discussed that benzodiazepines are not the best medication for him at this time.  For now, we will increase his Prozac to 40 mg daily and have him follow-up in the next 1-2 months. -Increase fluoxetine to 40 mg -Follow-up in 1-2 months

## 2020-06-17 ENCOUNTER — Telehealth: Payer: Self-pay | Admitting: Family Medicine

## 2020-06-17 LAB — VITAMIN D 25 HYDROXY (VIT D DEFICIENCY, FRACTURES): Vit D, 25-Hydroxy: 11.5 ng/mL — ABNORMAL LOW (ref 30.0–100.0)

## 2020-06-17 LAB — VITAMIN B12: Vitamin B-12: 483 pg/mL (ref 232–1245)

## 2020-06-17 NOTE — Telephone Encounter (Signed)
Patient is calling and wanted to let Dr. Homero Fellers know that he has decided he is not wanting to do a stress test at the moment. This was discussed at last appointment but he feels with the good results from his heart tests he would like to wait for now.

## 2020-06-17 NOTE — Telephone Encounter (Signed)
Routed to PCP. Vallie Fayette, CMA  

## 2020-06-24 ENCOUNTER — Other Ambulatory Visit: Payer: Self-pay | Admitting: Family Medicine

## 2020-06-24 MED ORDER — VITAMIN D (CHOLECALCIFEROL) 25 MCG (1000 UT) PO CAPS: 1.0000 | ORAL_CAPSULE | Freq: Every day | ORAL | 3 refills | Status: DC

## 2020-06-24 NOTE — Progress Notes (Signed)
Voicemail set up.  I left a message informing him that his vitamin D level is low and we should begin vitamin D supplementation.  We will plan to recheck in several months.

## 2020-08-16 ENCOUNTER — Other Ambulatory Visit: Payer: Self-pay | Admitting: Pharmacist

## 2020-08-24 ENCOUNTER — Telehealth: Payer: Self-pay

## 2020-08-24 ENCOUNTER — Other Ambulatory Visit: Payer: Self-pay

## 2020-08-24 ENCOUNTER — Ambulatory Visit (INDEPENDENT_AMBULATORY_CARE_PROVIDER_SITE_OTHER): Payer: BLUE CROSS/BLUE SHIELD | Admitting: Pharmacist

## 2020-08-24 ENCOUNTER — Encounter: Payer: Self-pay | Admitting: Pharmacist

## 2020-08-24 DIAGNOSIS — E118 Type 2 diabetes mellitus with unspecified complications: Secondary | ICD-10-CM

## 2020-08-24 DIAGNOSIS — E7800 Pure hypercholesterolemia, unspecified: Secondary | ICD-10-CM

## 2020-08-24 DIAGNOSIS — Z794 Long term (current) use of insulin: Secondary | ICD-10-CM

## 2020-08-24 MED ORDER — TRESIBA FLEXTOUCH 200 UNIT/ML ~~LOC~~ SOPN: 40.0000 [IU] | PEN_INJECTOR | Freq: Every day | SUBCUTANEOUS | 0 refills | Status: DC

## 2020-08-24 NOTE — Patient Instructions (Signed)
Nice to see you today!  Congratulations on quitting smoking.   Keep up the great work on your diabetes control.   Next follow up with Dr. Atha Starks in the next 2-3 weeks.

## 2020-08-24 NOTE — Telephone Encounter (Signed)
Left message regarding possible pt assistance with novo nordisk. Call back #[phone]

## 2020-08-24 NOTE — Assessment & Plan Note (Signed)
Diabetes longstanding and currently at goal with excellent home readings. Patient is able to verbalize appropriate hypoglycemia management plan. Medication adherence appears good however he continues to need support for medications including his injectable diabetes therapies. -Continued basal insulin Tresiba (insulin degludec) at 40 units each day (samples provided).  -Continued GLP-1 Ozempic (generic name semaglutide) at 0.5mg  once weekly.   (Patient has short-term supply and will need support through manufacturer to continue).  -Extensively discussed pathophysiology of diabetes, recommended lifestyle interventions, dietary effects on blood sugar control -Counseled on s/sx of and management of hypoglycemia -Next A1C anticipated 2-3 months.

## 2020-08-24 NOTE — Progress Notes (Signed)
S:     Chief Complaint  Patient presents with   Medication Management    Diabetes, tobacco cessation    Patient arrives in good spirits, ambulating without assistance.  Presents for diabetes evaluation, education, and management Patient was referred and last seen by Primary Care Provider, Dr. Mirian Mo, on 06/16/2020.   Patient reports his diabetes has been under great control.  All AM fasting readings are ~ 100. He also states that he completely quit cigarettes ~ 2-3 months ago.  He shared that he no longer drinks any beer.   He is concerned that he has gained ~ 20 pounds since quitting smoking.   Insurance coverage/medication affordability: currently uninsured.  States that he has an application pending and may have Medicaid coverage in the future.  He is unsure of the timeline.   Medication adherence reported excellent.  He gets the majority of his maintenance medications from [facility].  He gets his tizanidine and gabapentin from [employer] [facility] [employer]  .   Current diabetes medications include: Tresiba 40 units once daily and Ozempic 0.5mg  once weekly. (He has been able to increase to the higher dose of Ozempic with slow gradual titration up by 1 click at a time.  Current hypertension medications include: amlodipine and metoprolol  Current hyperlipidemia medications include: atorvastatin  Patient denies hypoglycemic events.  We discussed portion sizes and glycemic index concepts at some length.   Patient-reported exercise habits: minimal due to severe,chronic back issues.    Patient denies nocturia (nighttime urination).  Patient reports neuropathy (nerve pain).    O:  Physical Exam Constitutional:      Appearance: Normal appearance. He is obese.  Pulmonary:     Effort: Pulmonary effort is normal.  Neurological:     Mental Status: He is alert.  Psychiatric:        Mood and Affect: Mood normal.        Behavior: Behavior normal.        Thought  Content: Thought content normal.    Review of Systems  Neurological:  Positive for sensory change (bilaterally in lower legs).  Psychiatric/Behavioral:  The patient is nervous/anxious.    Lab Results  Component Value Date   HGBA1C 6.5 04/22/2020   Vitals:   08/24/20 1108  BP: 102/74  Pulse: 94  SpO2: 97%    Lipid Panel     Component Value Date/Time   CHOL 228 (H) 09/26/2019 1350   TRIG 360 (H) 09/26/2019 1350   HDL 41 09/26/2019 1350   CHOLHDL 5.6 (H) 09/26/2019 1350   LDLCALC 123 (H) 09/26/2019 1350   LDLDIRECT 97 10/16/2008 2230     Clinical Atherosclerotic Cardiovascular Disease (ASCVD): Yes  The 10-year ASCVD risk score Denman George DC Jr., et al., 2013) is: 17.2%   Values used to calculate the score:     Age: 60 years     Sex: Male     Is Non-Hispanic African American: Yes     Diabetic: Yes     Tobacco smoker: No     Systolic Blood Pressure: 102 mmHg     Is BP treated: Yes     HDL Cholesterol: 41 mg/dL     Total Cholesterol: 228 mg/dL    A/P: Diabetes longstanding and currently at goal with excellent home readings. Patient is able to verbalize appropriate hypoglycemia management plan. Medication adherence appears good however he continues to need support for medications including his injectable diabetes therapies. -Continued basal insulin Tresiba (insulin  degludec) at 40 units each day (samples provided).  -Continued GLP-1 Ozempic (generic name semaglutide) at 0.5mg  once weekly.   (Patient has short-term supply and will need support through manufacturer to continue).  -Extensively discussed pathophysiology of diabetes, recommended lifestyle interventions, dietary effects on blood sugar control -Counseled on s/sx of and management of hypoglycemia -Next A1C anticipated 2-3 months.    ASCVD risk - secondary prevention in patient with diabetes. Last LDL  was not  controlled.  - high intensity statin indicated. He has not had a repeat LDL since starting atorvastatin  last year.   -Continued atorvastatin 40 mg daily Repeat lipid panel in any lab work obtained at next visit.   Hypertension longstanding and currently controlled.  Blood pressure goal = <130/80 mmHg. Medication adherence appears excellent.   - NO change continue amlodipine and metoprolol at this time.   Tobacco Abuse history - QUIT smoking ~ 2 months ago.   Appears to be an excellent candidate for long-term quit Updated patient history of quitting smoking. (Change to former smoker on problem list at next visit if patient remains abstinent at that time.  Congratulated on success and encouraged continued abstinence from smoking.   Anxiety reported to be "out of control" by patient at today's visit. He states the higher dose of fluoxetine did not help with anxiety symptoms.  However, patient reports his grief is much improved with use of fluoxetine.  Deferred to next visit with PCP. - Suggest consideration of buspirone 5mg  BID and increase to higher doses if necessary to attempt improved anxiety control.    Back pain - chronic - deferred to PCP - Patient will meet new PCP in near future.   Written patient instructions provided.  Total time in face to face counseling 28 minutes.   Follow up PCP Clinic Visit in 2-3 weeks.

## 2020-08-24 NOTE — Assessment & Plan Note (Signed)
ASCVD risk - secondary prevention in patient with diabetes. Last LDL was not controlled.  - high intensity statin indicated. He has not had a repeat LDL since starting atorvastatin last year.   -Continued atorvastatin 40 mg daily Repeat lipid panel in any lab work obtained at next visit.

## 2020-08-25 NOTE — Telephone Encounter (Signed)
Pt returned phone call.   Pt agreed to have novo nordisk application mailed to him for assistance with ozempic. He is currently unemployed and has no insurance. Pt provided a different address to have application mailed to (he is currently helping a family member and is staying with them). He will mail completed application back to office.

## 2020-08-25 NOTE — Progress Notes (Signed)
Reviewed: I agree with Dr. Koval's documentation and management. 

## 2020-09-08 ENCOUNTER — Other Ambulatory Visit: Payer: Self-pay

## 2020-09-08 MED FILL — Gabapentin Tab 600 MG: ORAL | 30 days supply | Qty: 90 | Fill #1 | Status: AC

## 2020-09-08 MED FILL — Tizanidine HCl Tab 4 MG (Base Equivalent): ORAL | 30 days supply | Qty: 60 | Fill #1 | Status: AC

## 2020-09-09 ENCOUNTER — Encounter: Payer: Self-pay | Admitting: Family Medicine

## 2020-09-09 ENCOUNTER — Ambulatory Visit (INDEPENDENT_AMBULATORY_CARE_PROVIDER_SITE_OTHER): Payer: BLUE CROSS/BLUE SHIELD | Admitting: Family Medicine

## 2020-09-09 ENCOUNTER — Telehealth: Payer: Self-pay

## 2020-09-09 ENCOUNTER — Other Ambulatory Visit: Payer: Self-pay

## 2020-09-09 ENCOUNTER — Ambulatory Visit (INDEPENDENT_AMBULATORY_CARE_PROVIDER_SITE_OTHER): Payer: BLUE CROSS/BLUE SHIELD

## 2020-09-09 VITALS — BP 97/71 | HR 99 | Ht 68.0 in | Wt 248.4 lb

## 2020-09-09 DIAGNOSIS — I10 Essential (primary) hypertension: Secondary | ICD-10-CM

## 2020-09-09 DIAGNOSIS — Z23 Encounter for immunization: Secondary | ICD-10-CM

## 2020-09-09 DIAGNOSIS — E118 Type 2 diabetes mellitus with unspecified complications: Secondary | ICD-10-CM

## 2020-09-09 DIAGNOSIS — E7800 Pure hypercholesterolemia, unspecified: Secondary | ICD-10-CM

## 2020-09-09 DIAGNOSIS — F419 Anxiety disorder, unspecified: Secondary | ICD-10-CM

## 2020-09-09 DIAGNOSIS — Z794 Long term (current) use of insulin: Secondary | ICD-10-CM

## 2020-09-09 LAB — POCT GLYCOSYLATED HEMOGLOBIN (HGB A1C): HbA1c, POC (controlled diabetic range): 7.4 % — AB (ref 0.0–7.0)

## 2020-09-09 MED ORDER — BUSPIRONE HCL 5 MG PO TABS: 5.0000 mg | ORAL_TABLET | Freq: Two times a day (BID) | ORAL | 1 refills | Status: DC

## 2020-09-09 MED ORDER — AMLODIPINE BESYLATE 10 MG PO TABS: 5.0000 mg | ORAL_TABLET | Freq: Every day | ORAL | 3 refills | Status: DC

## 2020-09-09 NOTE — Assessment & Plan Note (Signed)
No SI or HI.  GAD-7 score of 10.  We will start BuSpar.  Recommend patient follow-up in the next 2 to 3 weeks to evaluate how he is doing.

## 2020-09-09 NOTE — Assessment & Plan Note (Signed)
A1c today of 7.4.  Recommend diabetic eye exam.  We will continue the Ozempic and the Tresiba 40 units daily.  Will recheck A1c in 3 months.  Patient can follow back up with me in 2 to 3 weeks to discuss diet and exercise in more detail.  We will check urine microalbumin today.

## 2020-09-09 NOTE — Assessment & Plan Note (Signed)
BP today of 97/71.  We will reduce patient's amlodipine from 10 mg/day to 5 mg/day.  We will have patient come back for recheck in about 3 weeks and can consider discontinuing amlodipine if appropriate.

## 2020-09-09 NOTE — Progress Notes (Signed)
    SUBJECTIVE:   CHIEF COMPLAINT / HPI:   Diabetic Follow Up: Patient is a 60 y.o. male who present today for diabetic follow up.   Patient endorses no problems  Home medications include: Tresiba 40 units daily, Ozempic 0.5 mg weekly Patient endorses taking these medications as prescribed.  Most recent A1Cs:  Lab Results  Component Value Date   HGBA1C 7.4 (A) 09/09/2020   HGBA1C 6.5 04/22/2020   HGBA1C 12.3 (A) 09/26/2019   Last Microalbumin, LDL, Creatinine: Lab Results  Component Value Date   MICROALBUR 30 10/08/2019   LDLCALC 123 (H) 09/26/2019   CREATININE 0.65 (L) 06/07/2020   Patient does check blood glucose on a regular basis.  Patient is not up to date on diabetic eye. Patient is up to date on diabetic foot exam.  Hyperlipidemia: Most recent lipid panel on 09/26/2019 with LDL of 123, HDL of 41.  Current medications include atorvastatin 40 mg daily  Anxiety: Used to take an anxiety medication after wife passed from stevens johnson syndrome. He was taking Prozac after that but wants to start an additional medication. He had talked to Dr. Raymondo Band about buspar.  No SI/continues on Prozac but would like to   PERTINENT  PMH / PSH: This  OBJECTIVE:   BP 97/71   Pulse 99   Ht 5\' 8"  (1.727 m)   Wt 248 lb 6.4 oz (112.7 kg)   SpO2 98%   BMI 37.77 kg/m    GAD 7 : Generalized Anxiety Score 09/09/2020  Nervous, Anxious, on Edge 3  Control/stop worrying 1  Worry too much - different things 1  Trouble relaxing 3  Restless 2  Easily annoyed or irritable 0  Afraid - awful might happen 0  Total GAD 7 Score 10     General: NAD, pleasant, able to participate in exam Cardiac: RRR, no murmurs. Respiratory: CTAB, normal effort Extremities: no edema or cyanosis. Skin: warm and dry, no rashes noted Neuro: alert, no obvious focal deficits Psych: Normal affect and mood  ASSESSMENT/PLAN:   Controlled diabetes mellitus type 2 with complications (HCC) A1c today of 7.4.   Recommend diabetic eye exam.  We will continue the Ozempic and the Tresiba 40 units daily.  Will recheck A1c in 3 months.  Patient can follow back up with me in 2 to 3 weeks to discuss diet and exercise in more detail.  We will check urine microalbumin today.  Anxiety No SI or HI.  GAD-7 score of 10.  We will start BuSpar.  Recommend patient follow-up in the next 2 to 3 weeks to evaluate how he is doing.  Essential hypertension, benign BP today of 97/71.  We will reduce patient's amlodipine from 10 mg/day to 5 mg/day.  We will have patient come back for recheck in about 3 weeks and can consider discontinuing amlodipine if appropriate.       11/09/2020, DO [facility]

## 2020-09-09 NOTE — Patient Instructions (Signed)
Our pharmacist should reach out to you later today about the paperwork for your Ozempic.  In the meantime want you to continue the same diabetic medications  We are starting a medication for anxiety called BuSpar.  You will take this twice per day.  I have sent it to your pharmacy.  I like to see you back in about 2 to 3 weeks to see how your anxiety is doing.  If you have any concern for harming yourself or someone else please let us know.  We are checking some blood work today and I will let you know the results when it returns  Because her blood pressure was low I would like to reduce her amlodipine from 10 mg a day to 5 mg/day.

## 2020-09-09 NOTE — Telephone Encounter (Signed)
Pt never rec'd novo nordisk app that was supposed to been mailed to his family friends house.  I will mail another app to address pt provided: ([address] [place] [address])

## 2020-09-10 LAB — LIPID PANEL
Chol/HDL Ratio: 3.9 ratio (ref 0.0–5.0)
Cholesterol, Total: 153 mg/dL (ref 100–199)
HDL: 39 mg/dL — ABNORMAL LOW (ref >39)
LDL Chol Calc (NIH): 89 mg/dL (ref 0–99)
Triglycerides: 143 mg/dL (ref 0–149)
VLDL Cholesterol Cal: 25 mg/dL (ref 5–40)

## 2020-09-10 LAB — MICROALBUMIN / CREATININE URINE RATIO
Creatinine, Urine: 254.6 mg/dL
Microalb/Creat Ratio: 5 mg/g creat (ref 0–29)
Microalbumin, Urine: 13.8 ug/mL

## 2020-09-27 NOTE — Progress Notes (Signed)
    SUBJECTIVE:   CHIEF COMPLAINT / HPI:   Anxiety follow-up: 60 year old male presenting for follow-up with anxiety.  On his last appointment we started BuSpar.  Today states he has significant improvement but would like to increase his dosing. No SI or HI.  He states he would like to try increasing the dosing to see if he can get further benefit.  Feeling like ears are stopped up: Patient states he feels like his ears are stopped up.  He has had an issue with impacted cerumen in the past.  He did try a Q-tip but was unsuccessful and get anything out.  PERTINENT  PMH / PSH: T2DM  OBJECTIVE:   BP 121/83   Pulse (!) 102   Wt 251 lb 8 oz (114.1 kg)   SpO2 97%   BMI 38.24 kg/m    Heart rate recheck 89  GAD 7 : Generalized Anxiety Score 09/09/2020  Nervous, Anxious, on Edge 3  Control/stop worrying 1  Worry too much - different things 1  Trouble relaxing 3  Restless 2  Easily annoyed or irritable 0  Afraid - awful might happen 0  Total GAD 7 Score 10   General: NAD, pleasant, able to participate in exam HEENT: clear canal with tympanic membrane nonerythematous,  Cardiac: RRR, no murmurs. Respiratory: CTAB, normal effort, No wheezes, rales or rhonchi Neuro: alert, no obvious focal deficits Psych: Normal affect and mood  ASSESSMENT/PLAN:   Anxiety Improved after starting BuSpar.  No SI or HI today.  Patient is interested in increasing his dose to see if he can get further benefit.  We will increase BuSpar to 10 mg twice daily.  Follow-up as needed.   Concern for impacted cerumen: HEENT exam showed nonerythematous tympanic membranes with clear canals.  Recommended against using Q-tips in the future.  Discussed that he can use some Debrox in the future if he feels his ears are stopped up or can come back for a evaluation.  Jackelyn Poling, DO [facility]

## 2020-09-28 ENCOUNTER — Other Ambulatory Visit: Payer: Self-pay

## 2020-09-28 ENCOUNTER — Ambulatory Visit (INDEPENDENT_AMBULATORY_CARE_PROVIDER_SITE_OTHER): Payer: BLUE CROSS/BLUE SHIELD | Admitting: Family Medicine

## 2020-09-28 DIAGNOSIS — F419 Anxiety disorder, unspecified: Secondary | ICD-10-CM

## 2020-09-28 MED ORDER — BUSPIRONE HCL 5 MG PO TABS: 10.0000 mg | ORAL_TABLET | Freq: Two times a day (BID) | ORAL | 1 refills | Status: DC

## 2020-09-28 NOTE — Assessment & Plan Note (Signed)
Improved after starting BuSpar.  No SI or HI today.  Patient is interested in increasing his dose to see if he can get further benefit.  We will increase BuSpar to 10 mg twice daily.  Follow-up as needed.

## 2020-09-28 NOTE — Patient Instructions (Addendum)
Today we discussed your anxiety as well as checked your ears.  I am glad that you are doing so much better from an anxiety standpoint.  I do think it would be fine to increase the BuSpar to 10 mg in the morning and 10 mg at night.  I have sent in a new prescription at the higher dosing.  Your ears look clear bilaterally.  If you have any sensation that sugars are being called you can always come back in for [place] to recheck it or you can use some Debrox to help break up any earwax that is in the way.

## 2020-09-29 NOTE — Telephone Encounter (Signed)
Submitted application for OZEMPIC 2MG /1.5ML & TRESIBA U100 to [employer] for patient assistance.   Phone: [phone]

## 2020-10-05 ENCOUNTER — Other Ambulatory Visit: Payer: Self-pay

## 2020-10-05 MED FILL — Tizanidine HCl Tab 4 MG (Base Equivalent): ORAL | 30 days supply | Qty: 60 | Fill #2 | Status: AC

## 2020-10-05 MED FILL — Gabapentin Tab 600 MG: ORAL | 30 days supply | Qty: 90 | Fill #2 | Status: AC

## 2020-10-08 ENCOUNTER — Other Ambulatory Visit: Payer: Self-pay

## 2020-10-18 NOTE — Telephone Encounter (Signed)
Received notification from [employer] regarding approval for TRESIBA U100 & OZEMPIC. Patient assistance approved from 10/18/20 to 10/18/21.  MEDICATION WILL SHIP TO OFFICE  Phone: [phone]

## 2020-10-18 NOTE — Progress Notes (Signed)
ERROR

## 2020-11-02 ENCOUNTER — Telehealth: Payer: Self-pay

## 2020-11-02 MED ORDER — FLUTICASONE PROPIONATE 50 MCG/ACT NA SUSP: 2.0000 | Freq: Every day | NASAL | 11 refills | Status: DC

## 2020-11-02 NOTE — Telephone Encounter (Signed)
Fluticasone Nasal spray refill provided.  Patient has been taking long-term.  Sent to [facility].

## 2020-11-02 NOTE — Telephone Encounter (Signed)
Pt called & wanted to f/u on novo nordisk drug shipment. Let pt know we should receive meds here at [facility] in the next 2 weeks or so.   Pt also asked for me to reach out to Dr. Raymondo Band about refills on his flonase. He would like these sent to [facility].  Thanks!

## 2020-11-02 NOTE — Addendum Note (Signed)
Addended by: Kathrin Ruddy on: 11/02/2020 03:38 PM   Modules accepted: Orders

## 2020-11-09 ENCOUNTER — Other Ambulatory Visit: Payer: Self-pay

## 2020-11-09 ENCOUNTER — Telehealth: Payer: Self-pay | Admitting: Pharmacist

## 2020-11-09 ENCOUNTER — Telehealth: Payer: Self-pay

## 2020-11-09 MED FILL — Gabapentin Tab 600 MG: ORAL | 30 days supply | Qty: 90 | Fill #3 | Status: AC

## 2020-11-09 MED FILL — Tizanidine HCl Tab 4 MG (Base Equivalent): ORAL | 30 days supply | Qty: 60 | Fill #3 | Status: AC

## 2020-11-09 NOTE — Telephone Encounter (Signed)
Patient called with interest in sharing news of his A1c being less then 6.5 recently.  He thanked me for the support to achieve this success.   Additionally, he shared that he had a recent visit with his PCP and he was concerned that his bill was more than $1500.  I shared that I could not help him with his bill and asked him to call our office's main number [phone] for assistance as they would likely be able to help direct him to someone who could explain the charges and rectify any errors.  He was accepting of this plan.   He asked if I could see him in December and avoid a larger bill again.  He plans to return to his PCP in early 2023 when his new insurance is in place.  I agreed that it would be acceptable for me to handle his next follow-up to ensure he has the agents he needs to maintain his health and drug therapy access.

## 2020-11-09 NOTE — Telephone Encounter (Signed)
SPOKE WITH PT REGARDING MED PICKUP. OZEMPIC & TRESIBA MEDICATIONS ARE LABELED & READY IN MED ROOM FRIDGE. WILL PROVIDE SAMPLE PEN NEEDLES AS WELL (UNTIL ORDER GETS IN FROM [employer]).

## 2020-11-09 NOTE — Telephone Encounter (Signed)
Noted and agree. 

## 2020-11-22 ENCOUNTER — Other Ambulatory Visit: Payer: Self-pay | Admitting: Family Medicine

## 2020-11-22 ENCOUNTER — Other Ambulatory Visit: Payer: Self-pay | Admitting: Pharmacist

## 2020-11-22 DIAGNOSIS — E7800 Pure hypercholesterolemia, unspecified: Secondary | ICD-10-CM

## 2020-11-23 ENCOUNTER — Telehealth: Payer: Self-pay | Admitting: Pharmacist

## 2020-11-23 NOTE — Telephone Encounter (Signed)
Patient called directly to my office to inquire about changes in blood sugars and to discuss insulin dose adjustments.   Patient reported that his blood sugars were doing well until recently when he noticed his blood sugars were running much higher fasting (reported fasting readings in the mid-igh 100s).   Patient reported that he had INCREASED his Tresiba (insulin degludec) to try and improve his worsening control.  He increased his Tresiba from 40 to 44 units then to 48 units and most recently to 52 units once daily. He expressed concern that his blood sugars were not responding to his dose increases.   He denied and S/Sx of infection.  He admitted that he has been living in a highly stressful situation recently.   He believes that he has dealt with the interpersonal stressor yesterday and hopes his stressful living situation will be much improved.   We discussed how high levels of stress were likely the cause of his worsened control recently and he should anticipate a return to his previous "good" control within the following few days.  Patient verbalized understanding that his blood sugar control is impacted by his stress.   No changes to his insulin regimen were suggested. He was asked to resume his previously prescribed dose of Tresiba (40 units once daily).   I asked him to monitor his blood sugar for 5-7 days and if remained uncontrolled, consider a call to make an appointment for additional discussion and decision making.  If his blood sugar control is suboptimal at that time, consider small dose increase of [place].

## 2020-11-24 NOTE — Telephone Encounter (Signed)
I reviewed and agree with Dr Macky Lower assessment and plan

## 2020-12-29 ENCOUNTER — Telehealth: Payer: Self-pay

## 2020-12-29 NOTE — Telephone Encounter (Signed)
Spoke with pt regarding med pickup. 2 boxes of pen needles are ready and labeled in med room. Pt says he will pick them up at his appt on 01/07/21.

## 2021-01-03 ENCOUNTER — Other Ambulatory Visit: Payer: Self-pay

## 2021-01-03 ENCOUNTER — Other Ambulatory Visit: Payer: Self-pay | Admitting: Pharmacist

## 2021-01-03 ENCOUNTER — Telehealth: Payer: Self-pay | Admitting: *Deleted

## 2021-01-03 NOTE — Telephone Encounter (Signed)
Noted and agree. 

## 2021-01-03 NOTE — Telephone Encounter (Signed)
Pt calling in stating that its important for dr Raymondo Band to call him. No reason given. Please advise.Cody Nolan, CMA

## 2021-01-03 NOTE — Telephone Encounter (Signed)
Contacted patient with his "urgent request for call back".   Patient reports elevated blood sugar readings > 350 for the last several days.  He has been in pain lately, taking acetaminophen 4000mg  daily without pain control.  Patient rates chronic pain at 7/10   Tresiba at 48 units once daily.  Patient feels that this medication is "like water" with minimal impact on blood sugar.   Ozempic (semaglutide) 0.5mg  taking weekly on Sunday.   Patient reports anxiety is slightly improved with buspirone.   He checked his blood sugar while we were on the phone. Result 300mg /dl.   Suggested a single additional dose of Tresiba 15 units now and plan to follow-up with him in the AM.  Visit rescheduled with me for 12/6 at 9:45 AM.

## 2021-01-04 ENCOUNTER — Other Ambulatory Visit: Payer: Self-pay

## 2021-01-04 ENCOUNTER — Ambulatory Visit (INDEPENDENT_AMBULATORY_CARE_PROVIDER_SITE_OTHER): Payer: Self-pay | Admitting: Pharmacist

## 2021-01-04 ENCOUNTER — Other Ambulatory Visit: Payer: Self-pay | Admitting: Family Medicine

## 2021-01-04 DIAGNOSIS — E118 Type 2 diabetes mellitus with unspecified complications: Secondary | ICD-10-CM

## 2021-01-04 DIAGNOSIS — Z794 Long term (current) use of insulin: Secondary | ICD-10-CM

## 2021-01-04 DIAGNOSIS — I10 Essential (primary) hypertension: Secondary | ICD-10-CM

## 2021-01-04 DIAGNOSIS — F419 Anxiety disorder, unspecified: Secondary | ICD-10-CM

## 2021-01-04 MED ORDER — AMLODIPINE BESYLATE 5 MG PO TABS: 5.0000 mg | ORAL_TABLET | Freq: Every day | ORAL | 3 refills | Status: DC

## 2021-01-04 MED ORDER — INSULIN LISPRO 100 UNIT/ML IJ SOLN: 10.0000 [IU] | Freq: Two times a day (BID) | INTRAMUSCULAR | 0 refills | Status: DC

## 2021-01-04 MED ORDER — TIZANIDINE HCL 4 MG PO TABS
ORAL_TABLET | ORAL | 11 refills | Status: AC
  Filled 2021-01-04: qty 60, 30d supply, fill #0
  Filled 2021-02-08: qty 60, 30d supply, fill #1
  Filled 2021-02-08: qty 60, 30d supply, fill #0
  Filled 2021-04-29: qty 60, 30d supply, fill #1
  Filled 2021-06-02: qty 60, 30d supply, fill #2
  Filled 2021-07-01: qty 60, 30d supply, fill #3
  Filled 2021-08-03: qty 60, 30d supply, fill #4
  Filled 2021-09-12: qty 60, 30d supply, fill #5
  Filled 2021-10-12: qty 60, 30d supply, fill #6
  Filled 2021-11-10: qty 60, 30d supply, fill #7
  Filled 2022-01-03: qty 60, 30d supply, fill #8

## 2021-01-04 MED ORDER — BUSPIRONE HCL 10 MG PO TABS: 10.0000 mg | ORAL_TABLET | Freq: Three times a day (TID) | ORAL | 2 refills | Status: DC

## 2021-01-04 MED ORDER — TRESIBA FLEXTOUCH 200 UNIT/ML ~~LOC~~ SOPN: 26.0000 [IU] | PEN_INJECTOR | Freq: Two times a day (BID) | SUBCUTANEOUS | 0 refills | Status: DC

## 2021-01-04 MED ORDER — GABAPENTIN 600 MG PO TABS
ORAL_TABLET | ORAL | 11 refills | Status: DC
  Filled 2021-01-04: qty 90, 30d supply, fill #0
  Filled 2021-02-08: qty 90, 30d supply, fill #1
  Filled 2021-02-08: qty 90, 30d supply, fill #0
  Filled 2021-04-29: qty 90, 30d supply, fill #1
  Filled 2021-06-02: qty 90, 30d supply, fill #2
  Filled 2021-07-01: qty 90, 30d supply, fill #3
  Filled 2021-08-03: qty 90, 30d supply, fill #4
  Filled 2021-09-12: qty 90, 30d supply, fill #5
  Filled 2021-10-13: qty 90, 30d supply, fill #6
  Filled 2021-11-10 (×2): qty 90, 30d supply, fill #7
  Filled 2022-01-03: qty 90, 30d supply, fill #8

## 2021-01-04 NOTE — Assessment & Plan Note (Signed)
Hypertension longstanding recently patient reinitiated his previous dose of amlodipine 10mg  following elevated home reading.  Blood pressure control is significantly decreased with restart of amlodipine at 10mg .  -Reduce amlodipine to 5mg  daily. - New prescription provided.

## 2021-01-04 NOTE — Assessment & Plan Note (Signed)
Diabetes longstanding worsened control recently - most likely related to dental - tooth infection.  Denies any fevers.  Patient is able to verbalize appropriate hypoglycemia management plan. Medication adherence appears good. Control is suboptimal due to need to additional insulin to control for stress/infection related hyperblycemia. -Adjusted dose of basal insulin Tresiba (insulin degludec) from 48 units once daily to 26 units twice daily.  -Added  rapid insulin Humalog (insulin lispro) 10 units prior to TWO meals per day.  -Continued GLP-1 Ozempic (semaglutide) at 0.5mg  once weekly.   -Extensively discussed pathophysiology of diabetes, recommended lifestyle interventions, dietary effects on blood sugar control - Medication Sample - insulin needles and Humalog have been provided to the patient. Drug name: Humalog (insulin Lispro)       Strength: 100units/ml        Qty: 1 vial  LOT: [id number]  Exp.Date: 03/01/2021 The patient has been instructed regarding the correct time, dose, and frequency of taking this medication, including desired effects and most common side effects.

## 2021-01-04 NOTE — Assessment & Plan Note (Signed)
Anxiety- improved control with use of fluoxetine 40mg  and buspirione 10mg  BID.  Patient continues to have some anxiety.  Buspirone dose increased from 10mg  BID to TID.  - New prescription provided.

## 2021-01-04 NOTE — Progress Notes (Signed)
S:     Chief Complaint  Patient presents with   Medication Management    Diabetes - Hypertension    Patient arrives in fair spirits, ambulating without assistance.  Presents for diabetes evaluation, education, and management following reports of elevated blood sugar readings > 300mg /dl.   Patient has been seen in pharmacist clinic multiple times in the past.   Patient reports Diabetes has become poorly controlled over the past several weeks.  He states his anxiety has improved with addition of buspirone but reports that he continues to have some anxiety routinely and asks for consideration of increasing the dose of buspirone.    Insurance coverage/medication affordability: Currently only has [place] - However, may have wife's survivor Medicare coverage in January.   Medication adherence reported good .   Current diabetes medications include: Tresiba (insuline degludec) 48 units daily, Ozempic (semaglutide) 0.5mg  weekly Current hypertension medications include: restarted amlodipine 10mg  due to elevated home readings. Current hyperlipidemia medications include: atorvastatin 40mg  daily  Patient denies hypoglycemic events.  Patient reported dietary habits: Eats 2 meals/day  Patient-reported exercise habits: limited but trying to walk more.    O:  Physical Exam Constitutional:      Appearance: He is obese.  Pulmonary:     Effort: Pulmonary effort is normal.  Neurological:     Mental Status: He is alert.  Psychiatric:        Thought Content: Thought content normal.        Judgment: Judgment normal.     Review of Systems  Constitutional:  Positive for malaise/fatigue. Negative for fever.  Musculoskeletal:  Positive for back pain and joint pain.  Psychiatric/Behavioral:  The patient is not nervous/anxious (decreased anxiety with buspirone).    Dental - single tooth - pending removal  Lab Results  Component Value Date   HGBA1C 7.4 (A) 09/09/2020   There  were no vitals filed for this visit.  Lipid Panel     Component Value Date/Time   CHOL 153 09/09/2020 1108   TRIG 143 09/09/2020 1108   HDL 39 (L) 09/09/2020 1108   CHOLHDL 3.9 09/09/2020 1108   LDLCALC 89 09/09/2020 1108   LDLDIRECT 97 10/16/2008 2230    Home fasting blood sugars: consistently > 300 over the last 1-2 weeks   Clinical Atherosclerotic Cardiovascular Disease (ASCVD): Yes  The 10-year ASCVD risk score (Arnett DK, et al., 2019) is: 21.7%   Values used to calculate the score:     Age: 60 years     Sex: Male     Is Non-Hispanic African American: Yes     Diabetic: Yes     Tobacco smoker: No     Systolic Blood Pressure: 121 mmHg     Is BP treated: Yes     HDL Cholesterol: 39 mg/dL     Total Cholesterol: 153 mg/dL    A/P: Diabetes longstanding worsened control recently - most likely related to dental - tooth infection.  Denies any fevers.  Patient is able to verbalize appropriate hypoglycemia management plan. Medication adherence appears good. Control is suboptimal due to need to additional insulin to control for stress/infection related hyperblycemia. -Adjusted dose of basal insulin Tresiba (insulin degludec) from 48 units once daily to 26 units twice daily.  -Added  rapid insulin Humalog (insulin lispro) 10 units prior to TWO meals per day.  -Continued GLP-1 Ozempic (semaglutide) at 0.5mg  once weekly.   -Extensively discussed pathophysiology of diabetes, recommended lifestyle interventions, dietary effects on blood  sugar control - Medication Sample - insulin needles and Humalog have been provided to the patient. Drug name: Humalog (insulin Lispro)       Strength: 100units/ml        Qty: 1 vial  LOT: [id number]  Exp.Date: 03/01/2021 The patient has been instructed regarding the correct time, dose, and frequency of taking this medication, including desired effects and most common side effects.    Hypertension longstanding recently patient reinitiated his previous dose of  amlodipine 10mg  following elevated home reading.  Blood pressure control is significantly decreased with restart of amlodipine at 10mg .  -Reduce amlodipine to 5mg  daily. - New prescription provided.   Anxiety- improved control with use of fluoxetine 40mg  and buspirione 10mg  BID.  Patient continues to have some anxiety.  Buspirone dose increased from 10mg  BID to TID.  - New prescription provided.   All above reviewed with Dr. .   Patient instructed to diminish use of fluticasone NS for reported nasal irritation until his nostril heals.   Patient instructed to return to Dr. for additional support with pain management.  - Acetamionphen use of 1000mg  4Xdaily is currently being used.  Patient denies use > 4000mg .   Written patient instructions provided.  Total time in face to face counseling 44 minutes.   Follow up PCP in 2-4 weeks.   Follow up Rx Clinic PRN.

## 2021-01-04 NOTE — Patient Instructions (Signed)
Nice to see you today.   Amlodipine 5mg  once daily (you can take half of the 10mg  until new dose is delivered) Buspirone 10mg  THREE time daily - new prescriptions sent to [facility]  Humalog  Start 10 units prior to each large meal   Tresiba Change to 26 units twice daily - you can take this prior to meals

## 2021-01-04 NOTE — Progress Notes (Signed)
Reviewed: I agree with Dr. Koval's documentation and assessment. 

## 2021-01-07 ENCOUNTER — Ambulatory Visit: Payer: Medicaid Other | Admitting: Pharmacist

## 2021-02-07 ENCOUNTER — Telehealth: Payer: Self-pay | Admitting: *Deleted

## 2021-02-07 NOTE — Telephone Encounter (Signed)
Pt calling in wanting dr Raymondo Band to give him a call and that it is very important. Wouldn't give reason. Please advise. Navi Ewton Bruna Potter, CMA

## 2021-02-07 NOTE — Telephone Encounter (Signed)
Noted and agree. 

## 2021-02-07 NOTE — Telephone Encounter (Signed)
Patient called and requested supply of Humalog.  Supplied Medication Samples have been provided to the patient.  Drug name: Humalog (insulin lispro)       Strength: 100units/ml        Qty: 2 vials  LOT: [id number]  Exp.Date: 03/01/2021  Dosing instructions: Continue current dosing prior to meals  The patient has been instructed regarding the correct time, dose, and frequency of taking this medication, including desired effects and most common side effects.   Janeann Forehand 11:45 AM 02/07/2021  Patient plans to pick-up on Wednesday 02/09/2021

## 2021-02-08 ENCOUNTER — Other Ambulatory Visit: Payer: Self-pay

## 2021-02-09 ENCOUNTER — Other Ambulatory Visit: Payer: Self-pay

## 2021-03-03 ENCOUNTER — Telehealth: Payer: Self-pay

## 2021-03-03 NOTE — Telephone Encounter (Signed)
Informed pt his medications from novo nordisk were ready for pickup.  Tresiba & ozempic are ready & labeled in med room frdge.

## 2021-03-07 NOTE — Telephone Encounter (Signed)
Patient presents to clinic for medications. Provided patient with medication per note from [place].   Talbot Grumbling, RN

## 2021-04-05 ENCOUNTER — Other Ambulatory Visit: Payer: Self-pay | Admitting: Family Medicine

## 2021-04-05 DIAGNOSIS — F419 Anxiety disorder, unspecified: Secondary | ICD-10-CM

## 2021-04-26 ENCOUNTER — Telehealth: Payer: Self-pay

## 2021-04-26 NOTE — Telephone Encounter (Signed)
Reached out to pt regarding an order of pen needles rec'd from [facility] (for pt to use with his tresiba). Pt says he has plenty and ok'd [place] to use them as samples here. ? ?Pt also requesting humalog samples. Says he is almost out & Dr Raymondo Band will normally give some when he can. Let pt know I would get the message to him & return call once I heard back. ?

## 2021-04-26 NOTE — Telephone Encounter (Signed)
Returned call regarding samples. ? ?Dr Raymondo Band was able to approve samples of Fiasp for pt, being that there is no humalog samples remaining. Also informed pt I will leave a [employer] application with his sample (for enrollment for Humalog medication) for him to sign and leave here for me. Pt aware he needs to take Fiasp the same way as he was taking the humalog.  ? ?Medication Samples have been provided to the patient. ? ?Drug name: FIASP VIAL       Strength: 100U/ML        Qty: 1 VIAL  LOT: [id number]  Exp.Date: 03/01/22 ? ?Dosing instructions: INJECT 10 UNITS TWICE DAILY, BEFORE A MEAL ? ?Shona Simpson ?3:01 PM ?04/26/2021 ? ?

## 2021-04-28 ENCOUNTER — Other Ambulatory Visit: Payer: Self-pay

## 2021-04-28 DIAGNOSIS — M5136 Other intervertebral disc degeneration, lumbar region: Secondary | ICD-10-CM

## 2021-04-28 DIAGNOSIS — G629 Polyneuropathy, unspecified: Secondary | ICD-10-CM

## 2021-04-29 ENCOUNTER — Other Ambulatory Visit: Payer: Self-pay

## 2021-05-03 ENCOUNTER — Other Ambulatory Visit: Payer: Self-pay

## 2021-05-03 NOTE — Telephone Encounter (Signed)
Medication samples picked up.  ? ?Application placed in [place] box.  ?

## 2021-05-20 ENCOUNTER — Telehealth: Payer: Self-pay

## 2021-05-20 NOTE — Telephone Encounter (Signed)
Received notification from [facility] regarding approval for HUMALOG VIAL. Patient assistance approved from 05/10/21 to 05/11/22. ? ?MEDICATION WILL SHIP TO PT'S HOME FROM [facility] (ADDED TO PT'S PREFERRED PHARMACIES) ? ?[facility] phone: [phone] ? ?

## 2021-05-30 ENCOUNTER — Ambulatory Visit
Admission: RE | Admit: 2021-05-30 | Discharge: 2021-05-30 | Disposition: A | Discharge status: Home / Self Care | Payer: Disability Insurance | Source: Ambulatory Visit

## 2021-05-30 ENCOUNTER — Ambulatory Visit
Admission: RE | Admit: 2021-05-30 | Discharge: 2021-05-30 | Disposition: A | Discharge status: Home / Self Care | Payer: Disability Insurance | Attending: Internal Medicine | Admitting: Internal Medicine

## 2021-05-30 DIAGNOSIS — G629 Polyneuropathy, unspecified: Secondary | ICD-10-CM

## 2021-05-30 DIAGNOSIS — M5136 Other intervertebral disc degeneration, lumbar region: Secondary | ICD-10-CM

## 2021-05-30 DIAGNOSIS — M179 Osteoarthritis of knee, unspecified: Secondary | ICD-10-CM | POA: Diagnosis not present

## 2021-05-30 DIAGNOSIS — M160 Bilateral primary osteoarthritis of hip: Secondary | ICD-10-CM | POA: Diagnosis not present

## 2021-06-01 ENCOUNTER — Other Ambulatory Visit: Payer: Self-pay

## 2021-06-02 ENCOUNTER — Other Ambulatory Visit: Payer: Self-pay

## 2021-06-02 MED ORDER — PROZAC 40 MG PO CAPS: 40.0000 mg | ORAL_CAPSULE | Freq: Every day | ORAL | 1 refills | Status: DC

## 2021-06-06 ENCOUNTER — Other Ambulatory Visit: Payer: Self-pay

## 2021-07-01 ENCOUNTER — Other Ambulatory Visit: Payer: Self-pay

## 2021-07-01 ENCOUNTER — Other Ambulatory Visit: Payer: Self-pay | Admitting: Family Medicine

## 2021-07-01 DIAGNOSIS — F419 Anxiety disorder, unspecified: Secondary | ICD-10-CM

## 2021-07-04 ENCOUNTER — Telehealth: Payer: Self-pay | Admitting: Pharmacist

## 2021-07-04 NOTE — Progress Notes (Unsigned)
    SUBJECTIVE:   CHIEF COMPLAINT / HPI:   Hip pain,***: ***  PERTINENT  PMH / PSH: ***  OBJECTIVE:   There were no vitals taken for this visit. ***  General: NAD, pleasant, able to participate in exam Respiratory: No respiratory distress MSK: *** Psych: Normal affect and mood  ASSESSMENT/PLAN:   No problem-specific Assessment & Plan notes found for this encounter.       Jackelyn Poling, DO [facility]

## 2021-07-04 NOTE — Telephone Encounter (Signed)
Patient called 5/31 and left message requesting call back.  No reason for call back left on my voice mail.   Upon return to office 6/5, I contacted patient who is reporting 8-9/10 hip pain which is not responding to acetaminophen.  He requests re-initiation of Tramadol which he has received in the past and he reports it worked well for pain.     I inquired if he had an appointment with Dr. Vanessa Willernie.  He stated that he is trying to avoid appointments during his disability review period to avoid additional visits that would require additional review for his disability.   I shared that I would forward his request to Dr. Vanessa Irwin for Tramadol.  I shared that I anticipated he should received contact within the next 24-48 hours from his PCP.  He was appreciative of the support.

## 2021-07-05 ENCOUNTER — Encounter: Payer: Self-pay | Admitting: Family Medicine

## 2021-07-05 ENCOUNTER — Ambulatory Visit (INDEPENDENT_AMBULATORY_CARE_PROVIDER_SITE_OTHER): Payer: Disability Insurance | Admitting: Family Medicine

## 2021-07-05 ENCOUNTER — Other Ambulatory Visit: Payer: Self-pay

## 2021-07-05 VITALS — BP 111/80 | HR 99 | Ht 68.0 in | Wt 275.4 lb

## 2021-07-05 DIAGNOSIS — M25552 Pain in left hip: Secondary | ICD-10-CM

## 2021-07-05 MED ORDER — TRAMADOL HCL 50 MG PO TABS
50.0000 mg | ORAL_TABLET | Freq: Four times a day (QID) | ORAL | 0 refills | Status: DC | PRN
  Filled 2021-07-05: qty 30, 8d supply, fill #0

## 2021-07-05 MED ORDER — CAPSAICIN 0.1 % EX CREA
TOPICAL_CREAM | CUTANEOUS | 0 refills | Status: DC
  Filled 2021-07-05: qty 60, fill #0

## 2021-07-05 NOTE — Patient Instructions (Addendum)
For your hip pain I am referring you to sports medicine for consideration for injections or other therapies.   In addition I am going to give you 30 tablets of tramadol each month.  I would not be able to increase this number at this time.  I would like for you to follow-up in about a month to see how you are doing.  I want you to try this for at least 2 to 3 weeks prior to considering additional treatments.  You can also continue with the Tylenol.  Lidocaine patches can also be of help.

## 2021-08-03 ENCOUNTER — Other Ambulatory Visit: Payer: Self-pay | Admitting: Family Medicine

## 2021-08-03 ENCOUNTER — Other Ambulatory Visit: Payer: Self-pay

## 2021-08-05 ENCOUNTER — Other Ambulatory Visit: Payer: Self-pay

## 2021-08-09 ENCOUNTER — Telehealth: Payer: Self-pay

## 2021-08-09 NOTE — Telephone Encounter (Signed)
Informed pt he has ozempic pens and pen needles ready for pickup. Pt will get them on Monday (08/15/21) when he has an appt.

## 2021-08-31 ENCOUNTER — Other Ambulatory Visit: Payer: Self-pay | Admitting: Family Medicine

## 2021-09-02 ENCOUNTER — Telehealth: Payer: Self-pay | Admitting: Pharmacist

## 2021-09-02 DIAGNOSIS — Z794 Long term (current) use of insulin: Secondary | ICD-10-CM

## 2021-09-02 MED ORDER — INSULIN LISPRO (1 UNIT DIAL) 100 UNIT/ML (KWIKPEN): 10.0000 [IU] | PEN_INJECTOR | Freq: Three times a day (TID) | SUBCUTANEOUS | 11 refills | Status: DC

## 2021-09-02 NOTE — Telephone Encounter (Signed)
-----   Message from Shona Simpson, CPhT sent at 09/02/2021  1:59 PM EDT ----- Pt due for refills through [facility]. Can a 3-4 month supply be escribed to [facility]? Medication will ship to pt's home once rec'd.

## 2021-09-02 NOTE — Telephone Encounter (Signed)
Requested new precription sent to mail order pharmacy for Humalog indigent care support.   New prescription for pens provided.

## 2021-09-05 NOTE — Telephone Encounter (Signed)
Noted and agree. 

## 2021-09-12 ENCOUNTER — Other Ambulatory Visit: Payer: Self-pay

## 2021-09-26 ENCOUNTER — Other Ambulatory Visit: Payer: Self-pay | Admitting: Family Medicine

## 2021-09-26 ENCOUNTER — Other Ambulatory Visit: Payer: Self-pay | Admitting: Pharmacist

## 2021-09-28 ENCOUNTER — Telehealth: Payer: Self-pay | Admitting: Pharmacist

## 2021-09-28 ENCOUNTER — Other Ambulatory Visit: Payer: Self-pay | Admitting: *Deleted

## 2021-09-28 DIAGNOSIS — I10 Essential (primary) hypertension: Secondary | ICD-10-CM

## 2021-09-28 DIAGNOSIS — F419 Anxiety disorder, unspecified: Secondary | ICD-10-CM

## 2021-09-28 MED ORDER — BUSPIRONE HCL 10 MG PO TABS: 10.0000 mg | ORAL_TABLET | Freq: Three times a day (TID) | ORAL | 2 refills | Status: DC

## 2021-09-28 MED ORDER — AMLODIPINE BESYLATE 5 MG PO TABS: 5.0000 mg | ORAL_TABLET | Freq: Every day | ORAL | 3 refills | Status: DC

## 2021-09-28 MED ORDER — PROZAC 40 MG PO CAPS: 40.0000 mg | ORAL_CAPSULE | Freq: Every day | ORAL | 1 refills | Status: DC

## 2021-09-28 NOTE — Telephone Encounter (Signed)
Noted and agree. 

## 2021-09-28 NOTE — Telephone Encounter (Signed)
Patient called my direct office line with question "I think my gabapentin is wearing off at the end of the dose, can the dose be increased higher than 600mg  TID?"  I discussed need to see PCP to discuss control of neuropathy.  He shared that the neuropathic pain and numbness is controlled for several hours but routinely wears off prior to his next dose.  He reports taking 600mg  gabapentin at 6 AM, 2 PM and 8 PM  I shared that higher doses are available.   He then shared that he also has hip pain that is moderately bothersome and chronic.  He states that the tramadol previously prescribed by Dr. did not work well for him.   He was instructed to call our front desk and schedule an appointment with his new PCP, Dr. .  He accepted this plan and stated he will call for an appointment that happens after September 1st when he anticipates having Medicaid coverage.  He expressed thank for the interaction.

## 2021-09-30 ENCOUNTER — Ambulatory Visit: Payer: Disability Insurance | Admitting: Pharmacist

## 2021-10-12 ENCOUNTER — Other Ambulatory Visit: Payer: Self-pay

## 2021-10-13 ENCOUNTER — Encounter: Payer: Self-pay | Admitting: Pharmacist

## 2021-10-13 ENCOUNTER — Other Ambulatory Visit: Payer: Self-pay

## 2021-10-13 ENCOUNTER — Ambulatory Visit (INDEPENDENT_AMBULATORY_CARE_PROVIDER_SITE_OTHER): Payer: Disability Insurance | Admitting: Pharmacist

## 2021-10-13 DIAGNOSIS — E118 Type 2 diabetes mellitus with unspecified complications: Secondary | ICD-10-CM

## 2021-10-13 DIAGNOSIS — E7800 Pure hypercholesterolemia, unspecified: Secondary | ICD-10-CM

## 2021-10-13 DIAGNOSIS — Z794 Long term (current) use of insulin: Secondary | ICD-10-CM

## 2021-10-13 LAB — POCT GLYCOSYLATED HEMOGLOBIN (HGB A1C): HbA1c, POC (controlled diabetic range): 6.8 % (ref 0.0–7.0)

## 2021-10-13 MED ORDER — SEMAGLUTIDE (1 MG/DOSE) 4 MG/3ML ~~LOC~~ SOPN: 1.0000 mg | PEN_INJECTOR | SUBCUTANEOUS | 3 refills | Status: DC

## 2021-10-13 MED ORDER — INSULIN LISPRO (1 UNIT DIAL) 100 UNIT/ML (KWIKPEN): 10.0000 [IU] | PEN_INJECTOR | Freq: Two times a day (BID) | SUBCUTANEOUS | 11 refills | Status: DC

## 2021-10-13 MED ORDER — TRESIBA FLEXTOUCH 200 UNIT/ML ~~LOC~~ SOPN: 22.0000 [IU] | PEN_INJECTOR | Freq: Two times a day (BID) | SUBCUTANEOUS | 0 refills | Status: DC

## 2021-10-13 NOTE — Assessment & Plan Note (Signed)
Diabetes longstanding currently at goal based on A1c. Patient is able to verbalize appropriate hypoglycemia management plan. Medication adherence appears appropriate. -Decreased dose of basal insulin Tresiba (insulin degludec) from 26 units BID to 22 units BID. -Adjusted rapid insulin Humalog (insulin lispro) from 10 units TID to 10 units BID as he only eats 2 meals/day.  -Increased dose of GLP-1 Ozempic (semaglutide) to 1 mg minus 8 clicks x1 week, then 1 mg minus 4 clicks x1 week, then start 1 mg weekly.  -Patient educated on purpose, proper use, and potential adverse effects of Ozempic.  -Extensively discussed pathophysiology of diabetes, recommended lifestyle interventions, dietary effects on blood sugar control.  -Counseled on s/sx of and management of hypoglycemia.

## 2021-10-13 NOTE — Patient Instructions (Addendum)
It was nice to see you today!  Your goal blood sugar is 80-130 before eating and less than 180 after eating.  Medication Changes: Decrease Tresiba to 22 units BID. Continue Humalog 10 units before meals.  Increase Ozempic to 1 mg weekly. For the first week, dial to 1 mg then dial down 8 clicks. The second week, dial to 1 mg then dial down 4 clicks. Then week 3, you can take the full 1 mg dose.    Monitor blood sugars at home and keep a log (glucometer or piece of paper) to bring with you to your next visit.  Keep up the good work with diet and exercise. Aim for a diet full of vegetables, fruit and lean meats (chicken, [place], fish). Try to limit salt intake by eating fresh or frozen vegetables (instead of canned), rinse canned vegetables prior to cooking and do not add any additional salt to meals.

## 2021-10-13 NOTE — Assessment & Plan Note (Signed)
ASCVD risk - secondary prevention in patient with diabetes. Last LDL is 89 not at goal of <70 mg/dL. high intensity statin indicated.  -Continued atorvastatin 40 mg daily.

## 2021-10-13 NOTE — Progress Notes (Signed)
Reviewed: I agree with Dr. Koval's documentation and management. 

## 2021-10-13 NOTE — Progress Notes (Signed)
S:    Chief Complaint  Patient presents with   Medication Management    Diabetes   Cody Nolan is a 61 y.o. male who presents for diabetes evaluation, education, and management. PMH is significant for T2DM, HLD, BPH, anxiety, and GERD. Patient was referred by Dr. Pilar Plate in 2021. Patient has not yet met with new Primary Care Provider, Dr. Gwendolyn Lima.   Today, patient arrives in good spirits and presents without any assistance. He reports he does not take his second dose of Humalog if his pre-meal blood sugar is <120. This occurs 4-5 times a month.   Current diabetes medications include: Tresiba (insulin degludec) 26 units BID, Humalog (insulin lispro) 10 units BID, Ozempic (semaglutide) 0.5 mg weekly on Tuesday's Current hyperlipidemia medications include: atorvastatin 40 mg daily  Patient reports adherence to taking all medications as prescribed.   Insurance coverage: None  Patient denies hypoglycemic events.  Reported home fasting blood sugars: 120s, highest value seen 154 mg/dL  Reported 2 hour post-meal/random blood sugars: not checking due to cost of strips.  Patient reports nocturia (nighttime urination).  Patient reports neuropathy (nerve pain).States gabapentin dose is no longer working at current dose.  Patient denies visual changes. Patient reports self foot exams.   Patient reported dietary habits: Eats 2 meals/day Drinks: occasional soda, getting tired of Gatorade Zero  O:  Review of Systems  Gastrointestinal:  Negative for abdominal pain, nausea and vomiting.  All other systems reviewed and are negative.   Physical Exam Constitutional:      Appearance: Normal appearance.  Neurological:     Mental Status: He is alert.  Psychiatric:        Mood and Affect: Mood normal.        Behavior: Behavior normal.    Lab Results  Component Value Date   HGBA1C 6.8 10/13/2021   Vitals:   10/13/21 1124  BP: 103/70  Pulse: (!) 104  SpO2: 95%    Lipid Panel      Component Value Date/Time   CHOL 153 09/09/2020 1108   TRIG 143 09/09/2020 1108   HDL 39 (L) 09/09/2020 1108   CHOLHDL 3.9 09/09/2020 1108   LDLCALC 89 09/09/2020 1108   LDLDIRECT 97 10/16/2008 2230    Clinical Atherosclerotic Cardiovascular Disease (ASCVD): Yes , history of MI The 10-year ASCVD risk score (Arnett DK, et al., 2019) is: 16.5%   Values used to calculate the score:     Age: 68 years     Sex: Male     Is Non-Hispanic African American: Yes     Diabetic: Yes     Tobacco smoker: No     Systolic Blood Pressure: 412 mmHg     Is BP treated: Yes     HDL Cholesterol: 39 mg/dL     Total Cholesterol: 153 mg/dL   A/P: Diabetes longstanding currently at goal based on A1c. Patient is able to verbalize appropriate hypoglycemia management plan. Medication adherence appears appropriate. -Decreased dose of basal insulin Tresiba (insulin degludec) from 26 units BID to 22 units BID. -Adjusted rapid insulin Humalog (insulin lispro) from 10 units TID to 10 units BID as he only eats 2 meals/day.  -Increased dose of GLP-1 Ozempic (semaglutide) to 1 mg minus 8 clicks x1 week, then 1 mg minus 4 clicks x1 week, then start 1 mg weekly.  -Patient educated on purpose, proper use, and potential adverse effects of Ozempic.  -Extensively discussed pathophysiology of diabetes, recommended lifestyle interventions, dietary effects on blood  sugar control.  -Counseled on s/sx of and management of hypoglycemia.   ASCVD risk - secondary prevention in patient with diabetes. Last LDL is 89 not at goal of <70 mg/dL. high intensity statin indicated.  -Continued atorvastatin 40 mg daily.  -BMET and lipid panel collected today.    Written patient instructions provided. Patient verbalized understanding of treatment plan.  Total time in face to face counseling 47 minutes.    Follow-up:  Pharmacist PRN. PCP clinic visit in 1 month.  Patient seen with Martina Sinner, PharmD Candidate, Jeneen Rinks,   PharmD PGY-1 Resident, and Joseph Art, PharmD, PGY2 Pharmacy Resident.

## 2021-10-14 LAB — BASIC METABOLIC PANEL
BUN/Creatinine Ratio: 14 (ref 10–24)
BUN: 10 mg/dL (ref 8–27)
CO2: 25 mmol/L (ref 20–29)
Calcium: 9.5 mg/dL (ref 8.6–10.2)
Chloride: 97 mmol/L (ref 96–106)
Creatinine, Ser: 0.73 mg/dL — ABNORMAL LOW (ref 0.76–1.27)
Glucose: 146 mg/dL — ABNORMAL HIGH (ref 70–99)
Potassium: 4.3 mmol/L (ref 3.5–5.2)
Sodium: 137 mmol/L (ref 134–144)
eGFR: 104 mL/min/{1.73_m2} (ref >59)

## 2021-10-14 LAB — LIPID PANEL
Chol/HDL Ratio: 5.8 ratio — ABNORMAL HIGH (ref 0.0–5.0)
Cholesterol, Total: 213 mg/dL — ABNORMAL HIGH (ref 100–199)
HDL: 37 mg/dL — ABNORMAL LOW (ref >39)
LDL Chol Calc (NIH): 150 mg/dL — ABNORMAL HIGH (ref 0–99)
Triglycerides: 143 mg/dL (ref 0–149)
VLDL Cholesterol Cal: 26 mg/dL (ref 5–40)

## 2021-10-17 ENCOUNTER — Telehealth: Payer: Self-pay | Admitting: Pharmacist

## 2021-10-17 DIAGNOSIS — E7800 Pure hypercholesterolemia, unspecified: Secondary | ICD-10-CM

## 2021-10-17 NOTE — Telephone Encounter (Signed)
Noted and agree. 

## 2021-10-17 NOTE — Telephone Encounter (Signed)
Patient contacted for follow/up of lab results.   Patient states he did miss ~ 7 days of statin in the last month but has taken his atorvastatin daily for the most days during the last three months.   LDL cholesterol elevation appears to be related most to immobility with hip pain.  He denies any change in dietary intake in the last year.  Glycemic control is stable.   Discussed increasing dose of Atorvastatin to 80mg  at next fill.  I agreed not to send in today due to him not being able to receive medication from [facility] for at leas the next 30 days due to recent fill.    PLAN:  Increase to Atorvastatin 80mg  at next prescription fill with [facility].   Total time with patient call and documentation of interaction: 13 minutes.  Discuss further with Dr. Gwendolyn Lima at upcoming appointment. 10/19

## 2021-10-17 NOTE — Assessment & Plan Note (Signed)
Patient states he did miss ~ 7 days of statin in the last month but has taken his atorvastatin daily for the most days during the last three months.   LDL cholesterol elevation appears to be related most to immobility with hip pain.  He denies any change in dietary intake in the last year.  Glycemic control is stable.   Discussed increasing dose of Atorvastatin to 80mg  at next fill.  I agreed not to send in today due to him not being able to receive medication from [facility] for at leas the next 30 days due to recent fill.    PLAN:  Increase to Atorvastatin 80mg  at next prescription fill with [facility].

## 2021-10-17 NOTE — Telephone Encounter (Signed)
Attempted to contact patient for follow-up of lab results.  Left HIPAA compliant voice mail requesting call back to direct phone: [phone]  Total time with patient call and documentation of interaction: 7 minutes.

## 2021-10-17 NOTE — Progress Notes (Signed)
Cholesterol is elevated above normal. It is also not at goal though you are taking a cholesterol lowering medication. I would like you to make an appointment to discuss increasing your cholesterol medication.

## 2021-10-17 NOTE — Telephone Encounter (Signed)
-----   Message from Zenia Resides, MD sent at 10/14/2021  8:25 AM EDT -----  ----- Message ----- From: [place] Pink, CMA Sent: 10/13/2021  11:38 AM EDT To: Zenia Resides, MD

## 2021-10-24 ENCOUNTER — Encounter: Payer: Self-pay | Admitting: Family Medicine

## 2021-11-09 ENCOUNTER — Other Ambulatory Visit: Payer: Self-pay | Admitting: *Deleted

## 2021-11-09 DIAGNOSIS — E7800 Pure hypercholesterolemia, unspecified: Secondary | ICD-10-CM

## 2021-11-10 ENCOUNTER — Other Ambulatory Visit: Payer: Self-pay

## 2021-11-10 MED ORDER — ATORVASTATIN CALCIUM 40 MG PO TABS: ORAL_TABLET | ORAL | 11 refills | Status: DC

## 2021-11-11 ENCOUNTER — Other Ambulatory Visit: Payer: Self-pay

## 2021-11-15 ENCOUNTER — Telehealth: Payer: Self-pay

## 2021-11-15 ENCOUNTER — Telehealth: Payer: Self-pay | Admitting: Pharmacist

## 2021-11-15 MED ORDER — FLUTICASONE PROPIONATE 50 MCG/ACT NA SUSP: 2.0000 | Freq: Every day | NASAL | 11 refills | Status: AC

## 2021-11-15 NOTE — Telephone Encounter (Signed)
Submitted RE-ENROLLMENT application for OZEMPIC & TRESIBA to [facility] for patient assistance.   Phone: [phone]  

## 2021-11-15 NOTE — Telephone Encounter (Signed)
Prepared samples to bridge patient while re-enrollment processes.  Medication Samples have been provided to the patient.  Drug name: TRESIBA       Strength: U-100        Qty: 3 PENS  LOT: [id number]  Exp.Date: 05/30/23  Dosing instructions: INJECT 26 UNITS TWICE DAILY  The patient has been instructed regarding the correct time, dose, and frequency of taking this medication, including desired effects and most common side effects.   Vista Deck 4:51 PM 11/15/2021

## 2021-11-15 NOTE — Telephone Encounter (Signed)
Noted and agree. 

## 2021-11-15 NOTE — Telephone Encounter (Signed)
Patient contacted our office to request new prescription for fluticasone from the  [facility] in [place].  Last prescription sent 10/2020.  I agreed to refill as this has been effectively controlling rhinitis.   Also requested follow-up of his Tresiba MAP supply through Hortencia Pilar, CPhT I discussed availability of Tyler Aas with [facility] and she will follow-up with MAP program ([facility]) or sample.     Total time with patient call and documentation of interaction: 13 minutes.

## 2021-11-17 ENCOUNTER — Ambulatory Visit: Payer: Disability Insurance | Admitting: Family Medicine

## 2021-11-18 NOTE — Telephone Encounter (Signed)
Patient presents to clinic. Provided with medication per note from [facility].   Cody Mccurley C Kendell Sagraves, RN  

## 2021-12-26 ENCOUNTER — Telehealth: Payer: Self-pay

## 2021-12-26 NOTE — Telephone Encounter (Signed)
Pt called to follow up on ozempic shipment.   Pt's enrollment ended 10/18/21 and I have submitted re-enrollment with novo nordisk 11/08/21.  Pt says he is now taking 1mg  (a lady told him to increase his dose) and he has 2 shots of ozempic left. His original supply of 0.5mg  doses should have lasted until Dec. He has plenty of tresiba samples remaining. Informed him that re-enrollment takes a few weeks during this time of year. Encouraged him to reach out around his last shot if he doesn't hear from me first and we should be able to provide samples.    Dr Jan, do you know about the dose increase? I didn't see it anywhere in his chart.

## 2022-01-03 ENCOUNTER — Other Ambulatory Visit: Payer: Self-pay

## 2022-01-03 NOTE — Telephone Encounter (Signed)
Medication Samples have been provided to the patient.  Drug name: OZEMPIC       Strength: 4MG        Qty: 1 PEN  LOTRonny Bacon  Exp.Date: 12/30/23  Dosing instructions: INJECT 1MG  ONCE WEEKLY   Medication Samples have been provided to the patient.  Drug name: TRESIBA       Strength: U100        Qty: 3 PENS  LOT: 01/01/24  Exp.Date: 04/30/23  Dosing instructions: INJECT 22 UNITS TWICE DAILY   Oceanna Arruda N Everette 11:00 AM 01/03/2022

## 2022-01-11 IMAGING — DX DG CHEST 2V
2 series · 2 of 2 positions shown · non-contrast
Comparison: 01/19/2012

CLINICAL DATA: Short of breath, history of congestive heart failure

EXAM:
CHEST - 2 VIEW

[dg chest 2 view (1 of 2)]
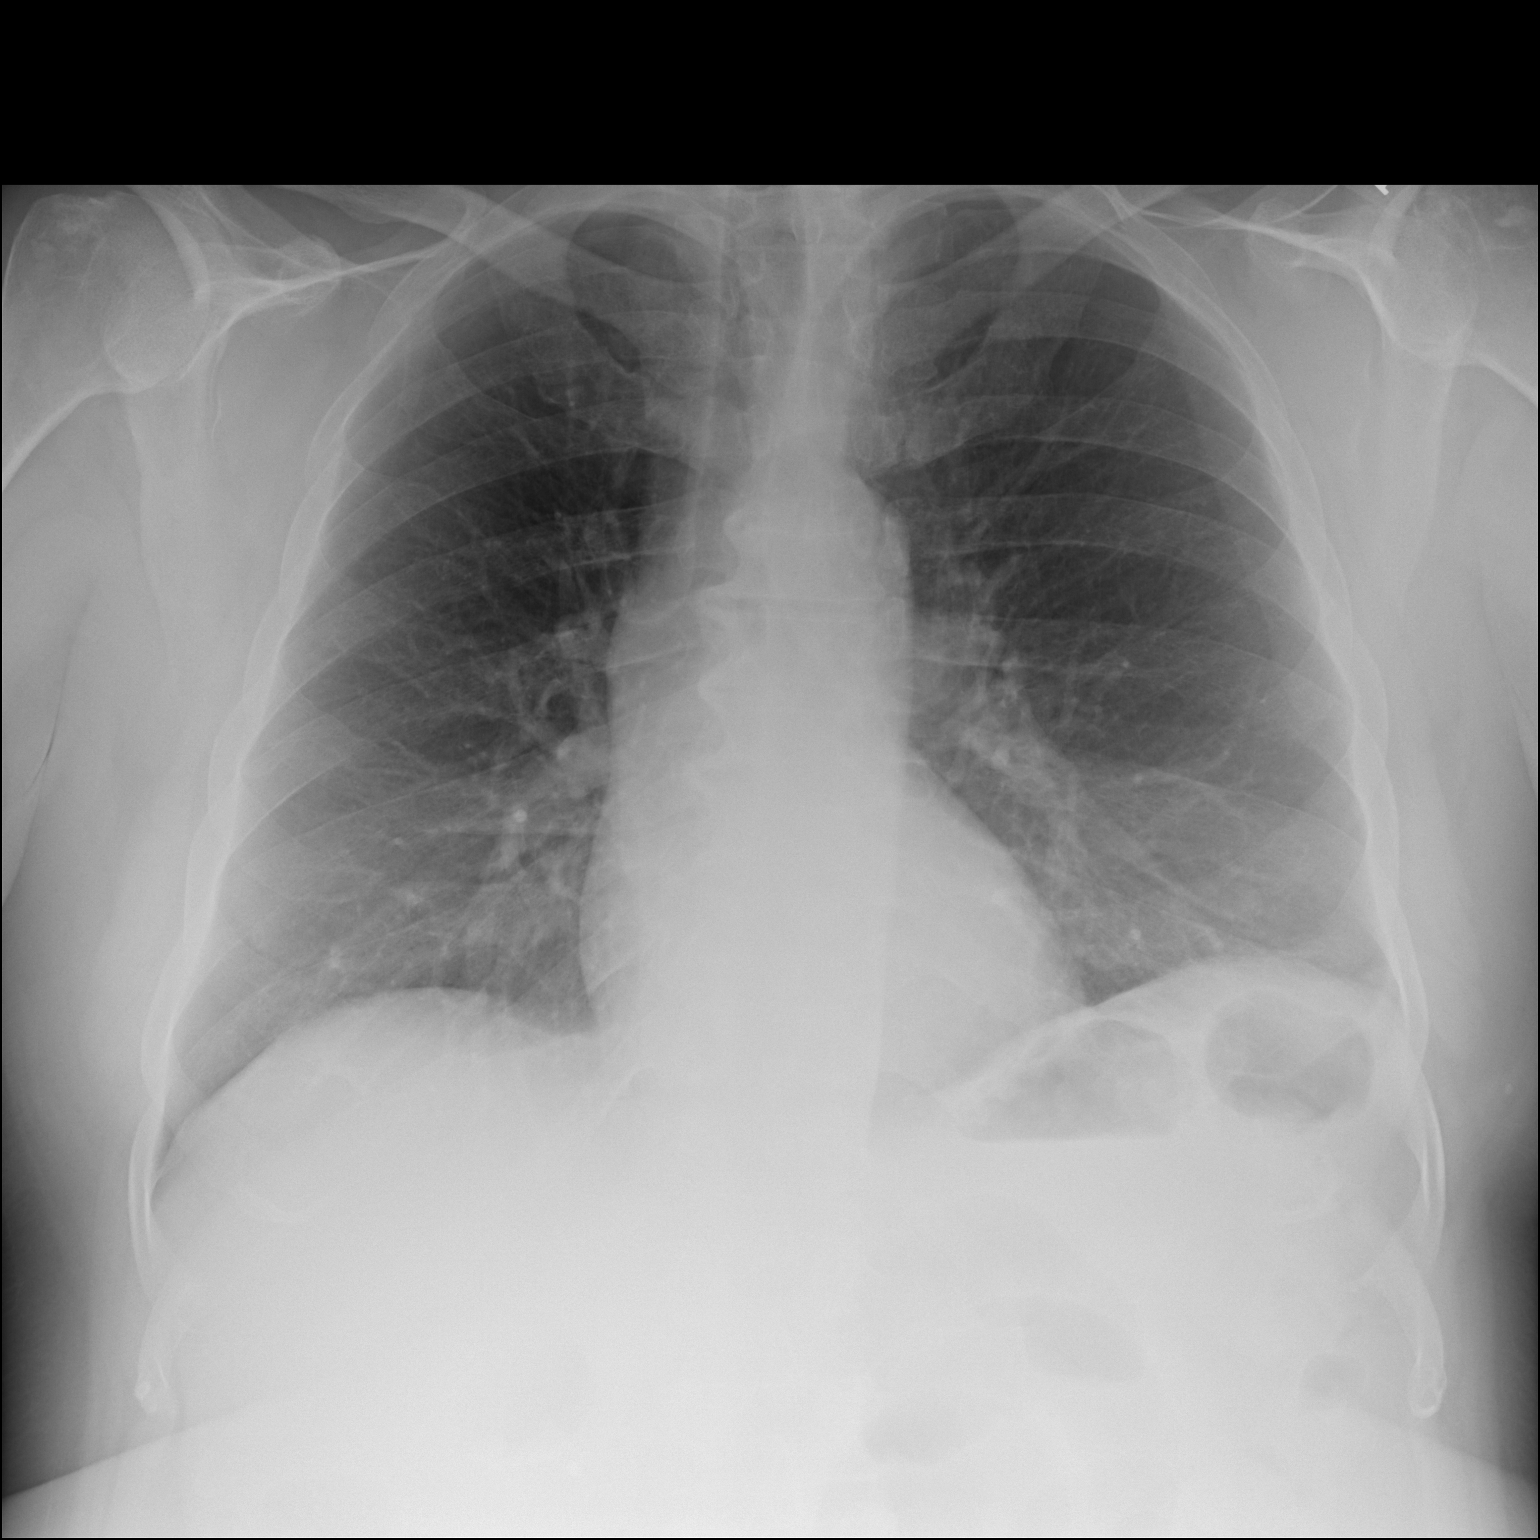

[dg chest 2 view (2 of 2)]
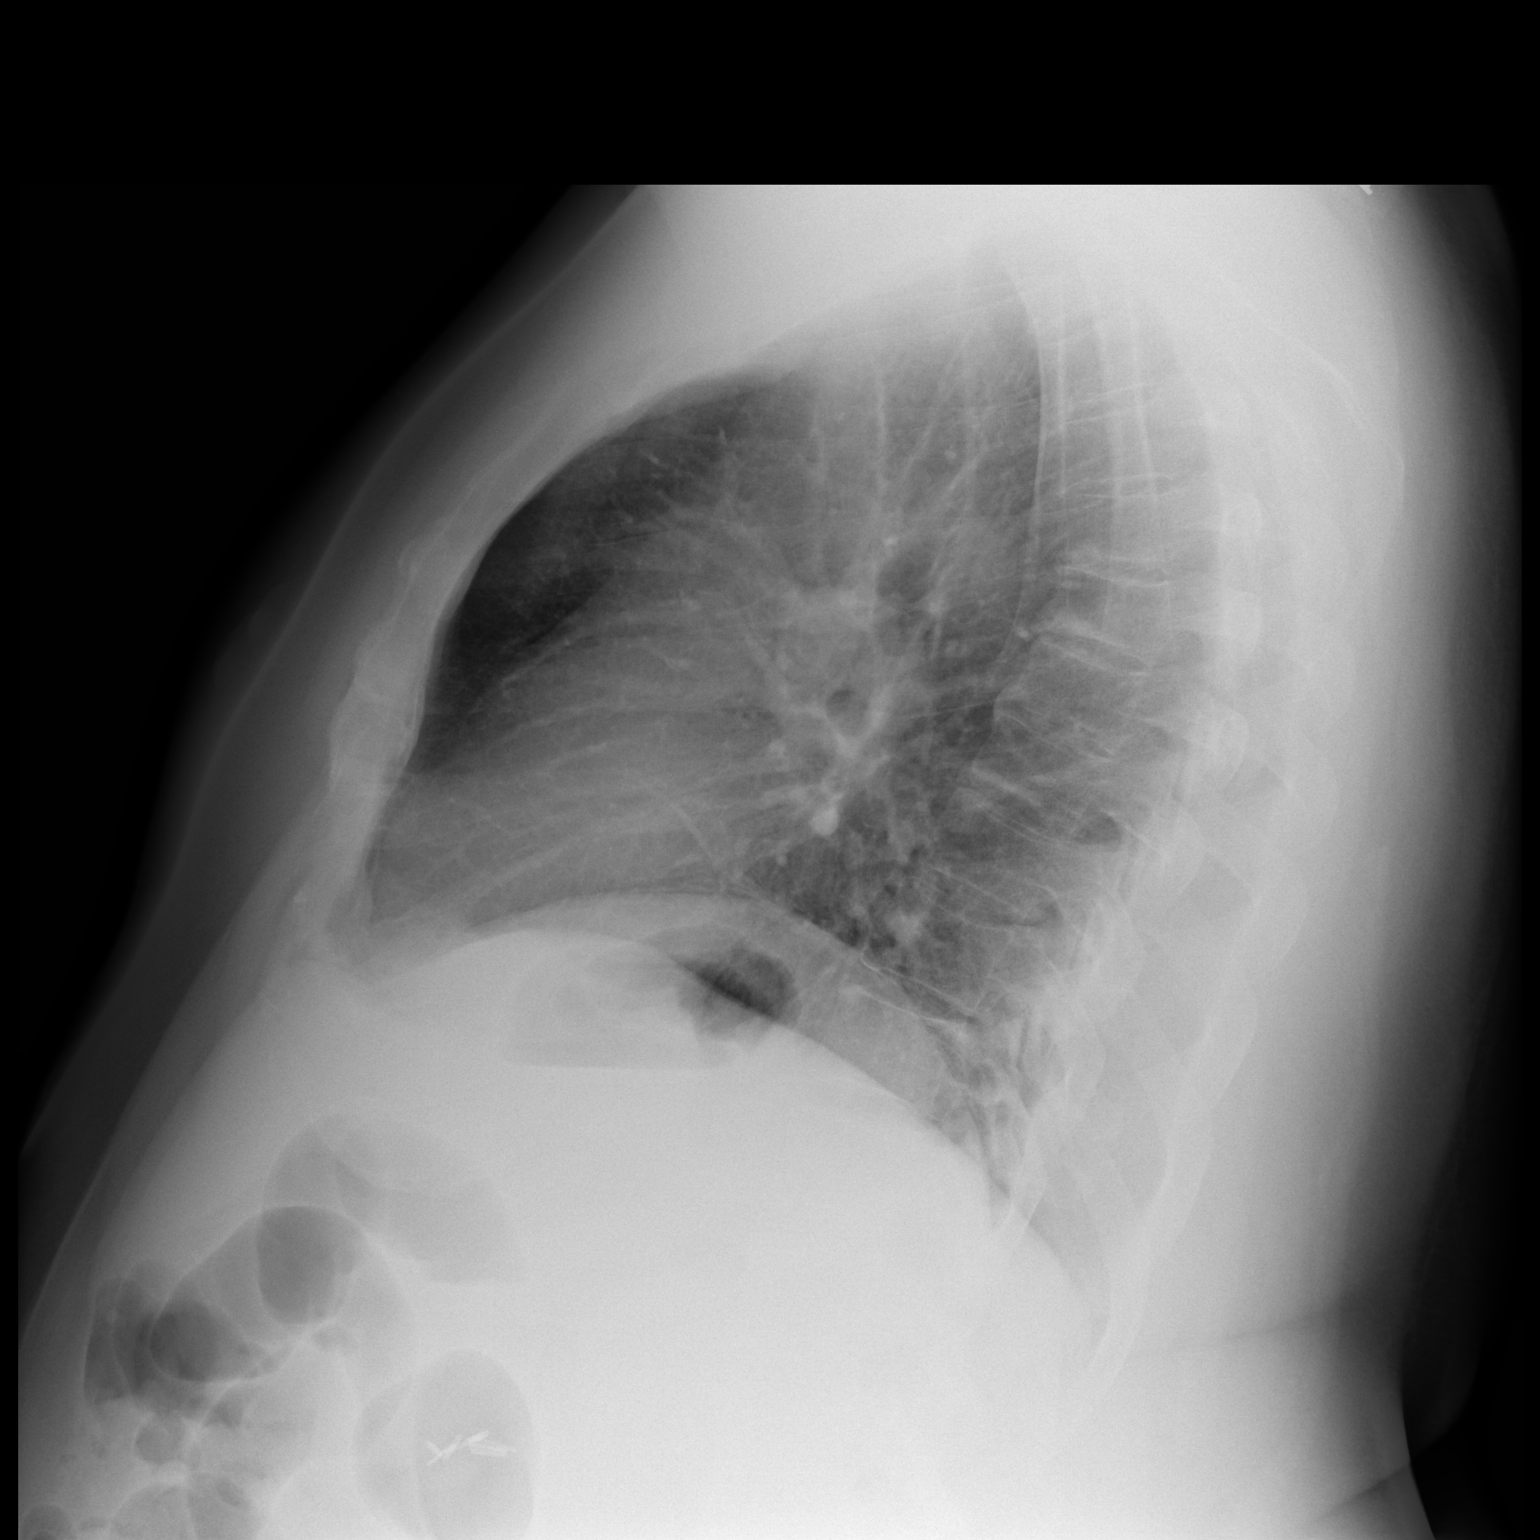

[2 of 2 positions shown; findings below may reference images not displayed]

FINDINGS: Frontal and lateral views of the chest demonstrate an unremarkable
cardiac silhouette. No airspace disease, effusion, or pneumothorax.
There are no acute bony abnormalities.
IMPRESSION: 1. No acute intrathoracic process.

## 2022-01-13 ENCOUNTER — Telehealth: Payer: Self-pay

## 2022-01-13 DIAGNOSIS — E7800 Pure hypercholesterolemia, unspecified: Secondary | ICD-10-CM

## 2022-01-13 MED ORDER — ATORVASTATIN CALCIUM 80 MG PO TABS: 80.0000 mg | ORAL_TABLET | Freq: Every day | ORAL | 3 refills | Status: DC

## 2022-01-13 NOTE — Telephone Encounter (Signed)
Informed his pt his novo nordisk shipment is ready for pickup. Pt can come early next week.  Ozempic, tresiba, and pen needles (on counter) are ready for pickup in med room.  Pt also says his lipitor medication has increased to 80mg . He would like a new RX sent to [facility] for delivery to him.

## 2022-01-13 NOTE — Addendum Note (Signed)
Addended by: Lockie Mola on: 01/13/2022 12:17 PM   Modules accepted: Orders

## 2022-01-18 ENCOUNTER — Other Ambulatory Visit: Payer: Self-pay | Admitting: Family Medicine

## 2022-01-18 DIAGNOSIS — F419 Anxiety disorder, unspecified: Secondary | ICD-10-CM

## 2022-01-19 NOTE — Telephone Encounter (Signed)
Patient picked up medications.

## 2022-02-10 NOTE — Telephone Encounter (Signed)
Received notification from [facility] regarding approval for [place]. Patient assistance approved from 01/04/22 to 01/30/23.  MEDICATIONS WILL SHIP TO OFFICE.  Phone: [phone]

## 2022-02-15 ENCOUNTER — Other Ambulatory Visit: Payer: Self-pay

## 2022-02-15 ENCOUNTER — Other Ambulatory Visit: Payer: Self-pay | Admitting: Pharmacist

## 2022-02-15 DIAGNOSIS — E118 Type 2 diabetes mellitus with unspecified complications: Secondary | ICD-10-CM

## 2022-02-15 DIAGNOSIS — R200 Anesthesia of skin: Secondary | ICD-10-CM

## 2022-02-20 ENCOUNTER — Other Ambulatory Visit: Payer: Self-pay

## 2022-02-20 ENCOUNTER — Other Ambulatory Visit: Payer: Self-pay | Admitting: Family Medicine

## 2022-02-22 ENCOUNTER — Other Ambulatory Visit: Payer: Self-pay

## 2022-02-23 ENCOUNTER — Telehealth: Payer: Self-pay | Admitting: Pharmacist

## 2022-02-23 ENCOUNTER — Other Ambulatory Visit: Payer: Self-pay

## 2022-02-23 DIAGNOSIS — M25552 Pain in left hip: Secondary | ICD-10-CM

## 2022-02-23 MED ORDER — TIZANIDINE HCL 4 MG PO TABS: 4.0000 mg | ORAL_TABLET | Freq: Two times a day (BID) | ORAL | 11 refills | Status: DC | PRN

## 2022-02-23 MED ORDER — OMEPRAZOLE 40 MG PO CPDR: 40.0000 mg | DELAYED_RELEASE_CAPSULE | Freq: Every day | ORAL | 3 refills | Status: DC

## 2022-02-23 MED FILL — Gabapentin Tab 600 MG: ORAL | 30 days supply | Qty: 90 | Fill #0 | Status: AC

## 2022-02-23 NOTE — Telephone Encounter (Signed)
Noted and agree. 

## 2022-02-23 NOTE — Telephone Encounter (Signed)
Patient called and shared a request for refilling three medications.   He then returned call to say the one of the medications had been refilled by Dr. Gwendolyn Lima.    I discussed refills for both Tizanidine and omeprazole with PCP, Dr. Gwendolyn Lima.   She agreed to send Tizanidine to his local pharmacy.   New prescription for Omeprazole was sent to [facility].  Patient has been taking chronically for moderate/severe GERD - GERD Controlled on omeprazole   Patient verbalized appreciation for help.

## 2022-02-23 NOTE — Addendum Note (Signed)
Addended by: Lowry Ram on: 02/23/2022 01:21 PM   Modules accepted: Orders

## 2022-02-27 ENCOUNTER — Other Ambulatory Visit: Payer: Self-pay

## 2022-02-27 ENCOUNTER — Other Ambulatory Visit: Payer: Self-pay | Admitting: Pharmacist

## 2022-02-27 DIAGNOSIS — M25552 Pain in left hip: Secondary | ICD-10-CM

## 2022-02-27 MED FILL — Tizanidine HCl Tab 4 MG (Base Equivalent): ORAL | 30 days supply | Qty: 60 | Fill #0 | Status: AC

## 2022-02-27 NOTE — Telephone Encounter (Signed)
Patient called and reported his local pharmacy had not received a prescription for Tizanidine.    I shared that I had not reviewed the order placed last week but would check.   He asked for tizanidine to be sent to his local pharmacy and NOT [place] as they do not cover this medication.    New prescription for tizanidine 4mg  BID PRN Muscle Spasm [facility]    Patient will follow-up with Dr. Gwendolyn Lima.

## 2022-02-28 ENCOUNTER — Other Ambulatory Visit: Payer: Self-pay

## 2022-04-03 ENCOUNTER — Other Ambulatory Visit: Payer: Self-pay

## 2022-04-03 MED FILL — Gabapentin Tab 600 MG: ORAL | 30 days supply | Qty: 90 | Fill #1 | Status: AC

## 2022-04-03 MED FILL — Tizanidine HCl Tab 4 MG (Base Equivalent): ORAL | 30 days supply | Qty: 60 | Fill #1 | Status: AC

## 2022-04-06 ENCOUNTER — Other Ambulatory Visit: Payer: Self-pay

## 2022-04-18 ENCOUNTER — Telehealth: Payer: Self-pay

## 2022-04-18 NOTE — Telephone Encounter (Signed)
Informed patient his novo nordisk shipment is ready for pickup  4 boxes of Tresiba 4 boxes of Ozempic 1mg  dose pens 3 boxes pen needles  All labeled and ready in med room

## 2022-04-24 ENCOUNTER — Other Ambulatory Visit: Payer: Self-pay | Admitting: Family Medicine

## 2022-04-24 DIAGNOSIS — F419 Anxiety disorder, unspecified: Secondary | ICD-10-CM

## 2022-04-25 NOTE — Telephone Encounter (Signed)
Patient presents to clinic to pick up medication shipment. Provided per note from [place].   Talbot Grumbling, RN

## 2022-05-08 ENCOUNTER — Other Ambulatory Visit: Payer: Self-pay

## 2022-05-08 MED FILL — Gabapentin Tab 600 MG: ORAL | 30 days supply | Qty: 90 | Fill #2 | Status: AC

## 2022-05-08 MED FILL — Tizanidine HCl Tab 4 MG (Base Equivalent): ORAL | 30 days supply | Qty: 60 | Fill #2 | Status: AC

## 2022-05-11 ENCOUNTER — Other Ambulatory Visit: Payer: Self-pay

## 2022-05-16 DIAGNOSIS — H5213 Myopia, bilateral: Secondary | ICD-10-CM | POA: Diagnosis not present

## 2022-05-16 DIAGNOSIS — E119 Type 2 diabetes mellitus without complications: Secondary | ICD-10-CM | POA: Diagnosis not present

## 2022-06-05 ENCOUNTER — Other Ambulatory Visit: Payer: Self-pay

## 2022-06-05 ENCOUNTER — Telehealth: Payer: Self-pay | Admitting: Pharmacist

## 2022-06-05 DIAGNOSIS — I10 Essential (primary) hypertension: Secondary | ICD-10-CM

## 2022-06-05 MED ORDER — AMLODIPINE BESYLATE 10 MG PO TABS
10.0000 mg | ORAL_TABLET | Freq: Every day | ORAL | 1 refills | Status: DC
  Filled 2022-06-05: qty 30, 30d supply, fill #0

## 2022-06-05 NOTE — Telephone Encounter (Signed)
Phone call from patient who reports need for increasing blood pressure medication from 5mg  to 10mg  to regain control of home blood pressure.  We had been able to control blood pressure with amlodipine 5mg  once daily for a period of time but it appears he now needs higher dose to control blood pressure.   Agreed with plan to increase amlodipine from 5 to 10mg  daily.  New prescription provided for [facility].  Last visit with me 10/13/2021.   Needs PCP visit.  1 plus 1 refill provided.  Plan is to make appointment with PCP, Dr. Marsh Dolly.

## 2022-06-12 ENCOUNTER — Telehealth: Payer: Self-pay | Admitting: Pharmacist

## 2022-06-12 ENCOUNTER — Other Ambulatory Visit: Payer: Self-pay

## 2022-06-12 NOTE — Telephone Encounter (Signed)
Returned call to patient. He did not answer. LVM asking patient to return call to office.   Veronda Prude, RN

## 2022-06-12 NOTE — Telephone Encounter (Signed)
Reviewed and agree with Dr Koval's plan.   

## 2022-06-12 NOTE — Telephone Encounter (Signed)
Patient called and left voice mail TWICE during the AM of 5/13 requesting call back.    I returned call mid-day.  Patient reports a fall in his bthroom over the weekend.  He requested assistance as he reports severe pain and belief that he may have fractured something in his hip/back.   He calls himself an "orthopedic nightmare" with history of multiple fractures AND orthopedic procedures.  He requested suggest referral to a pain clinic where they may help his with "vidodin or something strong".  He did not want to take an incremental approach, verbalizing that he needed help.   He denied offer for a work-in visit at [facility] in the next 1-2 days.  He states he will need at least three days to recover to the point he will be able to travel to the doctor.   I shared that I would have someone contact him to suggest next steps including the option of being seen in [facility] in ~ 3 days.   Routing request for contact and scheduling to Nurse Triage Team.   Patient was accepting of plan to await call from Triage Nurse for potential visit in the next few days.

## 2022-06-13 NOTE — Telephone Encounter (Signed)
Returned call to patient.   Fell in the tub, hit back, L hip and shoulder blade over the weekend.   Pain with standing and mobility.   Entire body hurting initially, now pain is primarily in back and left hip. Severe back pain. History of extensive back pain. He has seen multiple orthopedic specialists.   Advised same day appointment to evaluate injury. Patient states that he has no way to come into the office today and the soonest he can come in would be on Friday. Scheduled patient for Friday morning with Dr. Marisue Humble.   ED precautions discussed.   Patient verbalizes understanding.   Veronda Prude, RN

## 2022-06-13 NOTE — Telephone Encounter (Signed)
Patient calls nurse line returning Cody Nolan's phone call.   Patient stated, "I am tired of playing phone tag with her if she is not available then she will have to call me."  Attempted to help patient, however he was being unreasonable about speaking with only [name].

## 2022-06-14 ENCOUNTER — Ambulatory Visit (INDEPENDENT_AMBULATORY_CARE_PROVIDER_SITE_OTHER): Payer: Medicare Other | Admitting: Student

## 2022-06-14 ENCOUNTER — Other Ambulatory Visit: Payer: Self-pay

## 2022-06-14 ENCOUNTER — Other Ambulatory Visit: Payer: Self-pay | Admitting: Family Medicine

## 2022-06-14 ENCOUNTER — Ambulatory Visit
Admission: RE | Admit: 2022-06-14 | Discharge: 2022-06-14 | Disposition: A | Discharge status: Home / Self Care | Payer: Medicare Other | Source: Ambulatory Visit | Attending: Family Medicine | Admitting: Family Medicine

## 2022-06-14 VITALS — BP 104/60 | HR 98 | Wt 249.0 lb

## 2022-06-14 DIAGNOSIS — W182XXA Fall in (into) shower or empty bathtub, initial encounter: Secondary | ICD-10-CM | POA: Diagnosis not present

## 2022-06-14 DIAGNOSIS — M545 Low back pain, unspecified: Secondary | ICD-10-CM

## 2022-06-14 DIAGNOSIS — M25552 Pain in left hip: Secondary | ICD-10-CM

## 2022-06-14 DIAGNOSIS — N401 Enlarged prostate with lower urinary tract symptoms: Secondary | ICD-10-CM

## 2022-06-14 DIAGNOSIS — I10 Essential (primary) hypertension: Secondary | ICD-10-CM | POA: Diagnosis not present

## 2022-06-14 DIAGNOSIS — M160 Bilateral primary osteoarthritis of hip: Secondary | ICD-10-CM | POA: Insufficient documentation

## 2022-06-14 DIAGNOSIS — R3916 Straining to void: Secondary | ICD-10-CM

## 2022-06-14 DIAGNOSIS — Z8781 Personal history of (healed) traumatic fracture: Secondary | ICD-10-CM | POA: Diagnosis not present

## 2022-06-14 HISTORY — DX: Bilateral primary osteoarthritis of hip: M16.0

## 2022-06-14 MED ORDER — TRAMADOL HCL 50 MG PO TABS
50.0000 mg | ORAL_TABLET | Freq: Four times a day (QID) | ORAL | 0 refills | Status: DC | PRN
  Filled 2022-06-14: qty 60, 15d supply, fill #0

## 2022-06-14 MED ORDER — AMLODIPINE BESYLATE 10 MG PO TABS: 10.0000 mg | ORAL_TABLET | Freq: Every day | ORAL | 1 refills | Status: DC

## 2022-06-14 MED ORDER — TRAMADOL HCL 50 MG PO TABS
50.0000 mg | ORAL_TABLET | Freq: Four times a day (QID) | ORAL | 0 refills | Status: DC | PRN
  Filled 2022-06-14: qty 30, 8d supply, fill #0

## 2022-06-14 MED ORDER — TAMSULOSIN HCL 0.4 MG PO CAPS
0.4000 mg | ORAL_CAPSULE | Freq: Every day | ORAL | 3 refills | Status: DC
  Filled 2022-06-14: qty 90, 90d supply, fill #0

## 2022-06-14 MED FILL — Tizanidine HCl Tab 4 MG (Base Equivalent): ORAL | 30 days supply | Qty: 60 | Fill #3 | Status: AC

## 2022-06-14 MED FILL — Gabapentin Tab 600 MG: ORAL | 30 days supply | Qty: 90 | Fill #3 | Status: AC

## 2022-06-14 NOTE — Telephone Encounter (Signed)
Patient calls nurse line in in regards to back pain.   He reports he can not wait until 5/17 to be seen.   Patient refuses to see anyone else besides PCP.   Patient scheduled for [facility] this morning with PCP.

## 2022-06-14 NOTE — Progress Notes (Signed)
    SUBJECTIVE:   CHIEF COMPLAINT / HPI:   Acute on Chronic Low Back Pain in the Setting of Fall Here after a fall in the bathroom 1 week ago.  Lost his footing and tried to catch himself on a towel rack which ripped off the wall, he fell and hit his left hip on the edge of the tub on his way down.  Since then has had significant pain in his left hip and lumbar spine.  The pain in his left hip is located primarily over the lateral ischial spine where he fell but sometimes does radiate towards the front/groin.  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 Tells me he has fractured L5 twice before and has had a TENS unit in the past that worked well. Asking about a TENS unit again. Also would like to be considered for chronic opioid therapy.  Currently takes tizanidine, Tylenol, and has previously taken tramadol for his low back pain with decent success. No fevers, new incontinence, sensory changes, saddle anesthesia, major motor weakness.   PMH: Degenerative disc disease of the lumbar spine, femoroacetabular osteoarthritis of the bilateral hips  OBJECTIVE:   BP 104/60   Pulse 98   Wt 249 lb (112.9 kg)   SpO2 96%   BMI 37.86 kg/m   Gen: In good spirits MSK: Lumbar spine without step-off deformity, there is some midline tenderness but no paraspinal tenderness.  Left hip with ischial spine tenderness without obvious deformity.  Exam limited by body habitus and pain with active ROM.  Bilateral lower extremity strength is intact. Neuro: Antalgic gait, ambulates with cane Skin: No overlying bruising to the hip or back  ASSESSMENT/PLAN:   Pain of left hip Posttraumatic.  Fall 1 week ago.  He is ambulatory, however antalgic gait secondary to pain.  Pain is frontal/lateral in nature. -Will obtain an x-ray of the hip -Have refilled his tramadol for acute pain control -Have referred to PM&R for evaluation and management of his  chronic pain  BACK PAIN Acute on chronic.  No red flag symptoms.  Given acute trauma and midline pain, will obtain an x-ray of the lumbar spine.  Suspect he needs specialist management for his underlying chronic back pain.  I have referred him to physical medicine and rehab.  Suspect he would be a good candidate for a TENS unit given his history of response to the same.  He is interested in opioid therapy for his chronic pain but I hesitate to start him on this for chronic pain.  Long discussion today about opioids being in an adequate solution to chronic pain problems.  -X-ray of the lumbar spine ordered -I have refilled his tramadol -Okay to continue his tizanidine as needed -Considered a Toradol injection in clinic but he has an anaphylactic reaction to naproxen so deferred -Referral placed to physical medicine and rehab for his underlying chronic back pain     J Dorothyann Gibbs, MD [facility]

## 2022-06-14 NOTE — Assessment & Plan Note (Signed)
Posttraumatic.  Fall 1 week ago.  He is ambulatory, however antalgic gait secondary to pain.  Pain is frontal/lateral in nature. -Will obtain an x-ray of the hip -Have refilled his tramadol for acute pain control -Have referred to PM&R for evaluation and management of his chronic pain

## 2022-06-14 NOTE — Assessment & Plan Note (Signed)
Acute on chronic.  No red flag symptoms.  Given acute trauma and midline pain, will obtain an x-ray of the lumbar spine.  Suspect he needs specialist management for his underlying chronic back pain.  I have referred him to physical medicine and rehab.  Suspect he would be a good candidate for a TENS unit given his history of response to the same.  He is interested in opioid therapy for his chronic pain but I hesitate to start him on this for chronic pain.  Long discussion today about opioids being in an adequate solution to chronic pain problems.  -X-ray of the lumbar spine ordered -I have refilled his tramadol -Okay to continue his tizanidine as needed -Considered a Toradol injection in clinic but he has an anaphylactic reaction to naproxen so deferred -Referral placed to physical medicine and rehab for his underlying chronic back pain

## 2022-06-14 NOTE — Patient Instructions (Addendum)
The physical med and rehab folks will call you. If you havent heard from them in 2 weeks, give [place] a call.  I'm refilling your tramadol.   Let's get some X Rays., These will be done at [facility]. You can drive there yourself and walk in to have these done.  Eliezer Mccoy, MD

## 2022-06-14 NOTE — Progress Notes (Signed)
Called [facility] and canceled tramadol 50 mg- quantity of 30 per Dr. Lily Lovings instructions.   Patient also requesting that amlodipine be transferred from [facility] pharmacy to [facility]. Canceled at [facility] pharmacy and resent per verbal order from Dr. Marisue Humble.   Cody Prude, RN

## 2022-06-16 ENCOUNTER — Ambulatory Visit: Payer: Disability Insurance | Admitting: Student

## 2022-06-19 ENCOUNTER — Telehealth: Payer: Self-pay | Admitting: Family Medicine

## 2022-06-19 NOTE — Telephone Encounter (Signed)
Patient is calling for results of testing he had done and is wanting Dr. Marisue Humble to give him a call.   Please Advise.   Call back number: [phone]  Forwarding to Red team since he was seen by [facility].   Thanks!

## 2022-06-20 NOTE — Telephone Encounter (Signed)
Called patient and confirmed DOB. Let him know that him and back radiographs show no acute fractures or dislocations. Discussed that there is severe left hip osteoarthritis. He is interested in something like hip injections that could help with him and wants to delay hip replacement as long as possible. I discussed I will send a message to Dr. Marisue Humble to consider additional referrals for this. He was appreciative of call.

## 2022-06-27 ENCOUNTER — Encounter: Payer: Self-pay | Admitting: Physical Medicine & Rehabilitation

## 2022-06-28 DIAGNOSIS — H524 Presbyopia: Secondary | ICD-10-CM | POA: Diagnosis not present

## 2022-06-30 ENCOUNTER — Encounter: Payer: Medicare Other | Admitting: Physical Medicine & Rehabilitation

## 2022-07-03 ENCOUNTER — Telehealth: Payer: Self-pay

## 2022-07-03 NOTE — Telephone Encounter (Signed)
Spoke with Cody Nolan regarding [employer] re-enrollment for Humalog. Pts enrollment ended 05/11/22. Informed pt to make a f/u appt for his diabetes with Dr. Raymondo Band just to be sure insulin will be staying the same. Pt will try to schedule appt for 6/14 if possible (since he will be in the area for another appt). If so, he will also sign application while in office.

## 2022-07-03 NOTE — Telephone Encounter (Signed)
Patient LVM on nurse line requesting to speak to [place].   He reports he received "a lot" of stuff in the mail for medication assistance and he has some questions.   Will forward to [place].

## 2022-07-07 ENCOUNTER — Telehealth: Payer: Self-pay | Admitting: Family Medicine

## 2022-07-07 NOTE — Telephone Encounter (Signed)
Contacted Cody Nolan to schedule their annual wellness visit. Welcome to Medicare visit Due by 03/03/2023.  Thank you,  [facility] Support [facility] Direct dial  [phone]

## 2022-07-13 ENCOUNTER — Other Ambulatory Visit: Payer: Self-pay | Admitting: Student

## 2022-07-13 ENCOUNTER — Other Ambulatory Visit: Payer: Self-pay

## 2022-07-13 DIAGNOSIS — M545 Low back pain, unspecified: Secondary | ICD-10-CM

## 2022-07-13 MED FILL — Tizanidine HCl Tab 4 MG (Base Equivalent): ORAL | 30 days supply | Qty: 60 | Fill #4 | Status: AC

## 2022-07-13 MED FILL — Gabapentin Tab 600 MG: ORAL | 30 days supply | Qty: 90 | Fill #4 | Status: AC

## 2022-07-14 ENCOUNTER — Encounter: Payer: Medicare HMO | Attending: Physical Medicine & Rehabilitation | Admitting: Physical Medicine & Rehabilitation

## 2022-07-19 ENCOUNTER — Other Ambulatory Visit: Payer: Self-pay

## 2022-07-19 ENCOUNTER — Telehealth: Payer: Self-pay | Admitting: Pharmacist

## 2022-07-19 NOTE — Telephone Encounter (Signed)
Patient called and left voice mail requesting PCP to refill Tramadol.   Requested refill be sent to pharmacy - [facility] at [address] [place].

## 2022-07-20 ENCOUNTER — Telehealth: Payer: Self-pay | Admitting: Family Medicine

## 2022-07-20 ENCOUNTER — Other Ambulatory Visit: Payer: Self-pay

## 2022-07-20 DIAGNOSIS — M545 Low back pain, unspecified: Secondary | ICD-10-CM

## 2022-07-20 MED ORDER — TRAMADOL HCL 50 MG PO TABS
50.0000 mg | ORAL_TABLET | Freq: Four times a day (QID) | ORAL | 0 refills | Status: DC | PRN
Start: 2022-07-20 — End: 2022-11-15
  Filled 2022-07-20: qty 60, 15d supply, fill #0

## 2022-07-20 NOTE — Telephone Encounter (Signed)
Patient asking for refill of tramadol. Follows with pain clinic and says there is not an appointment until August.   During call with patient I explained to patient I would send one refill, but after that should have pain clinic following.

## 2022-08-14 ENCOUNTER — Other Ambulatory Visit: Payer: Self-pay | Admitting: Student

## 2022-08-14 ENCOUNTER — Other Ambulatory Visit: Payer: Self-pay | Admitting: Family Medicine

## 2022-08-14 DIAGNOSIS — F419 Anxiety disorder, unspecified: Secondary | ICD-10-CM

## 2022-08-14 DIAGNOSIS — I10 Essential (primary) hypertension: Secondary | ICD-10-CM

## 2022-09-02 ENCOUNTER — Emergency Department (HOSPITAL_COMMUNITY): Payer: Medicare HMO

## 2022-09-02 ENCOUNTER — Encounter (HOSPITAL_COMMUNITY): Payer: Self-pay

## 2022-09-02 ENCOUNTER — Other Ambulatory Visit: Payer: Self-pay

## 2022-09-02 ENCOUNTER — Inpatient Hospital Stay (HOSPITAL_COMMUNITY)
Admission: EM | Admit: 2022-09-02 | Discharge: 2022-09-22 | DRG: 057 | Disposition: A | Discharge status: Home / Self Care | Payer: Medicare HMO | Attending: Family Medicine | Admitting: Family Medicine

## 2022-09-02 DIAGNOSIS — I110 Hypertensive heart disease with heart failure: Secondary | ICD-10-CM | POA: Diagnosis present

## 2022-09-02 DIAGNOSIS — T383X6A Underdosing of insulin and oral hypoglycemic [antidiabetic] drugs, initial encounter: Secondary | ICD-10-CM | POA: Diagnosis present

## 2022-09-02 DIAGNOSIS — I252 Old myocardial infarction: Secondary | ICD-10-CM

## 2022-09-02 DIAGNOSIS — K6289 Other specified diseases of anus and rectum: Secondary | ICD-10-CM | POA: Diagnosis present

## 2022-09-02 DIAGNOSIS — Z7984 Long term (current) use of oral hypoglycemic drugs: Secondary | ICD-10-CM

## 2022-09-02 DIAGNOSIS — Z888 Allergy status to other drugs, medicaments and biological substances status: Secondary | ICD-10-CM

## 2022-09-02 DIAGNOSIS — Z597 Insufficient social insurance and welfare support: Secondary | ICD-10-CM

## 2022-09-02 DIAGNOSIS — Z886 Allergy status to analgesic agent status: Secondary | ICD-10-CM

## 2022-09-02 DIAGNOSIS — E669 Obesity, unspecified: Secondary | ICD-10-CM | POA: Diagnosis present

## 2022-09-02 DIAGNOSIS — A523 Neurosyphilis, unspecified: Secondary | ICD-10-CM | POA: Diagnosis not present

## 2022-09-02 DIAGNOSIS — F1494 Cocaine use, unspecified with cocaine-induced mood disorder: Secondary | ICD-10-CM | POA: Diagnosis present

## 2022-09-02 DIAGNOSIS — F1590 Other stimulant use, unspecified, uncomplicated: Secondary | ICD-10-CM | POA: Diagnosis present

## 2022-09-02 DIAGNOSIS — A539 Syphilis, unspecified: Secondary | ICD-10-CM | POA: Diagnosis present

## 2022-09-02 DIAGNOSIS — N179 Acute kidney failure, unspecified: Secondary | ICD-10-CM | POA: Diagnosis present

## 2022-09-02 DIAGNOSIS — E118 Type 2 diabetes mellitus with unspecified complications: Secondary | ICD-10-CM | POA: Diagnosis present

## 2022-09-02 DIAGNOSIS — K219 Gastro-esophageal reflux disease without esophagitis: Secondary | ICD-10-CM | POA: Diagnosis present

## 2022-09-02 DIAGNOSIS — E876 Hypokalemia: Secondary | ICD-10-CM | POA: Diagnosis present

## 2022-09-02 DIAGNOSIS — F19929 Other psychoactive substance use, unspecified with intoxication, unspecified: Secondary | ICD-10-CM

## 2022-09-02 DIAGNOSIS — E878 Other disorders of electrolyte and fluid balance, not elsewhere classified: Secondary | ICD-10-CM | POA: Diagnosis present

## 2022-09-02 DIAGNOSIS — Z880 Allergy status to penicillin: Secondary | ICD-10-CM

## 2022-09-02 DIAGNOSIS — R4182 Altered mental status, unspecified: Secondary | ICD-10-CM

## 2022-09-02 DIAGNOSIS — E860 Dehydration: Secondary | ICD-10-CM | POA: Diagnosis present

## 2022-09-02 DIAGNOSIS — M1612 Unilateral primary osteoarthritis, left hip: Secondary | ICD-10-CM | POA: Diagnosis present

## 2022-09-02 DIAGNOSIS — A530 Latent syphilis, unspecified as early or late: Secondary | ICD-10-CM | POA: Diagnosis present

## 2022-09-02 DIAGNOSIS — F14959 Cocaine use, unspecified with cocaine-induced psychotic disorder, unspecified: Secondary | ICD-10-CM | POA: Diagnosis present

## 2022-09-02 DIAGNOSIS — E1140 Type 2 diabetes mellitus with diabetic neuropathy, unspecified: Secondary | ICD-10-CM | POA: Diagnosis present

## 2022-09-02 DIAGNOSIS — R569 Unspecified convulsions: Secondary | ICD-10-CM | POA: Diagnosis present

## 2022-09-02 DIAGNOSIS — E785 Hyperlipidemia, unspecified: Secondary | ICD-10-CM | POA: Diagnosis present

## 2022-09-02 DIAGNOSIS — F1994 Other psychoactive substance use, unspecified with psychoactive substance-induced mood disorder: Secondary | ICD-10-CM

## 2022-09-02 DIAGNOSIS — Z794 Long term (current) use of insulin: Secondary | ICD-10-CM

## 2022-09-02 DIAGNOSIS — F3110 Bipolar disorder, current episode manic without psychotic features, unspecified: Secondary | ICD-10-CM | POA: Diagnosis present

## 2022-09-02 DIAGNOSIS — F419 Anxiety disorder, unspecified: Secondary | ICD-10-CM | POA: Diagnosis present

## 2022-09-02 DIAGNOSIS — F319 Bipolar disorder, unspecified: Secondary | ICD-10-CM | POA: Diagnosis present

## 2022-09-02 DIAGNOSIS — M160 Bilateral primary osteoarthritis of hip: Secondary | ICD-10-CM | POA: Diagnosis present

## 2022-09-02 DIAGNOSIS — F429 Obsessive-compulsive disorder, unspecified: Secondary | ICD-10-CM | POA: Diagnosis present

## 2022-09-02 DIAGNOSIS — Z87892 Personal history of anaphylaxis: Secondary | ICD-10-CM

## 2022-09-02 DIAGNOSIS — Z7985 Long-term (current) use of injectable non-insulin antidiabetic drugs: Secondary | ICD-10-CM

## 2022-09-02 DIAGNOSIS — M6282 Rhabdomyolysis: Secondary | ICD-10-CM | POA: Diagnosis present

## 2022-09-02 DIAGNOSIS — M479 Spondylosis, unspecified: Secondary | ICD-10-CM | POA: Diagnosis present

## 2022-09-02 DIAGNOSIS — G4733 Obstructive sleep apnea (adult) (pediatric): Secondary | ICD-10-CM | POA: Diagnosis present

## 2022-09-02 DIAGNOSIS — Z87891 Personal history of nicotine dependence: Secondary | ICD-10-CM

## 2022-09-02 DIAGNOSIS — Z8249 Family history of ischemic heart disease and other diseases of the circulatory system: Secondary | ICD-10-CM

## 2022-09-02 DIAGNOSIS — F600 Paranoid personality disorder: Secondary | ICD-10-CM | POA: Diagnosis present

## 2022-09-02 DIAGNOSIS — I509 Heart failure, unspecified: Secondary | ICD-10-CM | POA: Diagnosis present

## 2022-09-02 DIAGNOSIS — E119 Type 2 diabetes mellitus without complications: Secondary | ICD-10-CM | POA: Diagnosis present

## 2022-09-02 DIAGNOSIS — Z91128 Patient's intentional underdosing of medication regimen for other reason: Secondary | ICD-10-CM

## 2022-09-02 DIAGNOSIS — H9190 Unspecified hearing loss, unspecified ear: Secondary | ICD-10-CM | POA: Diagnosis present

## 2022-09-02 DIAGNOSIS — R9431 Abnormal electrocardiogram [ECG] [EKG]: Secondary | ICD-10-CM | POA: Diagnosis not present

## 2022-09-02 DIAGNOSIS — Z79899 Other long term (current) drug therapy: Secondary | ICD-10-CM

## 2022-09-02 DIAGNOSIS — G8929 Other chronic pain: Secondary | ICD-10-CM | POA: Diagnosis present

## 2022-09-02 DIAGNOSIS — Z6838 Body mass index (BMI) 38.0-38.9, adult: Secondary | ICD-10-CM

## 2022-09-02 MED ORDER — STERILE WATER FOR INJECTION IJ SOLN
INTRAMUSCULAR | Status: AC
  Administered 2022-09-02: 2 mL via INTRAMUSCULAR
  Filled 2022-09-02: qty 10

## 2022-09-02 MED ORDER — OLANZAPINE 5 MG PO TBDP
5.0000 mg | ORAL_TABLET | Freq: Every day | ORAL | Status: DC
  Filled 2022-09-02 (×2): qty 1

## 2022-09-02 MED ORDER — ZIPRASIDONE MESYLATE 20 MG IM SOLR
20.0000 mg | Freq: Once | INTRAMUSCULAR | Status: AC
  Administered 2022-09-02: 20 mg via INTRAMUSCULAR
  Filled 2022-09-02: qty 20

## 2022-09-02 NOTE — ED Notes (Signed)
Pt agitated and refusing all care including vitals. Geodon given without effect at this point.

## 2022-09-02 NOTE — ED Provider Notes (Signed)
[facility] EMERGENCY DEPARTMENT AT [facility] Provider Note   CSN: [id number] Arrival date & time: 09/02/22  1957     History  Chief Complaint  Patient presents with   Seizures   Paranoid    Cody Nolan is a 62 y.o. male.  Patient is a 62 year old male with a history of hypertension, diabetes, tachycardia, CHF, MI, depression who is presenting today with EMS for altered mental status.  Patient initially had an encounter with the police today when he drove his car to a [place] and drove up on the pavement.  Police then report that he got out of the car and he was acting irrationally.  They tried to get him to come back onto the sidewalk and he proceeded to walk into traffic and could not be reasoned with.  They then brought him to safety but reported that they then started noticing that he had 30 second seizure where he started shaking all over and stopped talking.  As soon as this episode stopped he became belligerent and arguing again.  When EMS arrived patient was given 5 of Versed due to concern for seizure and agitation so they could get him on the stretcher into the hospital.  Patient reports he feels like he is in the middle of a nervous breakdown there is legal things going on and he has the paperwork with him however he continues to perseverate on being charged incorrectly and is very irritated with the police.  When trying to ask him his medical history patient continues to become irate with the police and then will not stay in the room and is standing out in the hall wanting the police to leave as they are far away from him and not engaging.  Patient has not been violent with staff however is not allowing [place] to evaluate him medically and does not seem to be comprehending what is going on.  Based on prior records evaluated patient has never had this behavior before.  Unclear if he has taken any medications has an acute brain issue such as bleed tumor or  encephalopathy or is under the influence of alcohol.  The history is provided by the patient, the EMS personnel and medical records.  Seizures      Home Medications Prior to Admission medications   Medication Sig Start Date End Date Taking? Authorizing Provider  acetaminophen (TYLENOL) 500 MG tablet Take 1,000 mg by mouth every 6 (six) hours as needed.    [provider]  amLODipine (NORVASC) 10 MG tablet TAKE 1 Tablet BY MOUTH ONCE EVERY DAY 08/14/22   Lockie Mola, MD  atorvastatin (LIPITOR) 80 MG tablet Take 1 tablet (80 mg total) by mouth daily. 01/13/22   Lockie Mola, MD  busPIRone (BUSPAR) 10 MG tablet TAKE 1 Tablet BY MOUTH 3 TIMES DAILY 08/14/22   Lockie Mola, MD  diphenhydrAMINE (BENADRYL) 25 MG tablet Take 25 mg by mouth daily as needed for allergies.    [provider]  fluticasone (FLONASE) 50 MCG/ACT nasal spray Place 2 sprays into both nostrils daily. 11/15/21   Kathrin Ruddy, RPH-CPP  gabapentin (NEURONTIN) 600 MG tablet Take 1 tablet (600 mg total) by mouth 3 (three) times daily. 02/23/22 02/23/23  Lockie Mola, MD  insulin degludec (TRESIBA FLEXTOUCH) 200 UNIT/ML FlexTouch Pen Inject 22 Units into the skin 2 (two) times daily before a meal. Patient taking differently: Inject 26 Units into the skin 2 (two) times daily before a meal. 10/13/21  Moses Manners, MD  insulin lispro (HUMALOG KWIKPEN) 100 UNIT/ML KwikPen Inject 10 Units into the skin 2 (two) times daily before a meal. 10/13/21   Hensel, Santiago Bumpers, MD  metoprolol tartrate (LOPRESSOR) 25 MG tablet TAKE 1 Tablet  BY MOUTH TWICE DAILY 08/14/22   Lockie Mola, MD  omeprazole (PRILOSEC) 40 MG capsule Take 1 capsule (40 mg total) by mouth daily. 02/23/22   Kathrin Ruddy, RPH-CPP  PROZAC 40 MG capsule TAKE 1 Capsule BY MOUTH ONCE EVERY DAY 06/14/22   Lockie Mola, MD  Semaglutide, 1 MG/DOSE, 4 MG/3ML SOPN Inject 1 mg into the skin once a week. 10/13/21   Moses Manners, MD  tamsulosin (FLOMAX)  0.4 MG CAPS capsule Take 1 capsule (0.4 mg total) by mouth daily. 06/14/22   Alicia Amel, MD  tiZANidine (ZANAFLEX) 4 MG tablet Take 1 tablet (4 mg total) by mouth 2 (two) times daily as needed for muscle spasms. 02/27/22   Kathrin Ruddy, RPH-CPP  traMADol (ULTRAM) 50 MG tablet Take 1 tablet (50 mg total) by mouth every 6 (six) hours as needed for up to 60 doses. 07/20/22   Lockie Mola, MD  Vitamin D, Cholecalciferol, 25 MCG (1000 UT) CAPS Take 1 tablet by mouth daily. 06/24/20   Mirian Mo, MD  ipratropium (ATROVENT) 0.02 % nebulizer solution Take 2.5 mLs (0.5 mg total) by nebulization every 6 (six) hours as needed. 03/19/11 04/26/11  Richarda Overlie, MD      Allergies    Aleve [naproxen sodium], Lisinopril, and Metformin and related    Review of Systems   Review of Systems  Neurological:  Positive for seizures.    Physical Exam Updated Vital Signs Ht 5\' 8"  (1.727 m)   Wt 113.4 kg   BMI 38.01 kg/m  Physical Exam Vitals and nursing note reviewed.  Constitutional:      Appearance: He is well-developed.     Comments: Agitated, uncooperative  HENT:     Head: Normocephalic and atraumatic.  Eyes:     Conjunctiva/sclera: Conjunctivae normal.     Pupils: Pupils are equal, round, and reactive to light.  Cardiovascular:     Rate and Rhythm: Regular rhythm. Tachycardia present.     Heart sounds: No murmur heard. Pulmonary:     Effort: Pulmonary effort is normal. No respiratory distress.     Breath sounds: Normal breath sounds. No wheezing or rales.  Abdominal:     General: There is no distension.     Palpations: Abdomen is soft.     Tenderness: There is no abdominal tenderness. There is no guarding or rebound.  Musculoskeletal:        General: No tenderness. Normal range of motion.     Cervical back: Normal range of motion and neck supple.     Right lower leg: No edema.     Left lower leg: No edema.     Comments: Abrasion noted to the left shoulder and right elbow.  Patient has  full range of motion of both without evidence of discomfort  Skin:    General: Skin is warm and dry.     Findings: No erythema or rash.  Neurological:     Mental Status: He is alert and oriented to person, place, and time.     Comments: Patient is able to walk has no evidence of weakness or unsteady gait.  Psychiatric:     Comments: Patient is altered.  He is perseverating, belligerent, agitated.  He continues to interrupt  and say the same thing over and over again which is related to court documents.  He does report that he takes buspirone and Prozac and states he feels like he is in the middle of a nervous breakdown but denies wanting to hurt himself.     ED Results / Procedures / Treatments   Labs (all labs ordered are listed, but only abnormal results are displayed) Labs Reviewed  COMPREHENSIVE METABOLIC PANEL  BRAIN NATRIURETIC PEPTIDE  ETHANOL  AMMONIA  CBC WITH DIFFERENTIAL/PLATELET  TROPONIN I (HIGH SENSITIVITY)    EKG None  Radiology No results found.  Procedures Procedures    Medications Ordered in ED Medications  ziprasidone (GEODON) injection 20 mg (20 mg Intramuscular Given 09/02/22 2133)  sterile water (preservative free) injection (2 mLs Injection Given 09/02/22 2132)    ED Course/ Medical Decision Making/ A&P                                 Medical Decision Making Amount and/or Complexity of Data Reviewed Labs: ordered. Radiology: ordered.  Risk Prescription drug management.   Pt with multiple medical problems and comorbidities and presenting today with a complaint that caries a high risk for morbidity and mortality.  Here today with altered mental status.  Patient is agitated and based on prior medical notes does not seem to be acting his normal self.  Concern for possible substance use such as alcohol or other drugs that could be causing the agitation and abnormality however also concern for possible intracranial hemorrhage, mass, abnormal  electrolytes, AKI, cardiac pathology.  Patient did have a blood sugar done which was 150.  Multiple attempts were made to redirect the patient both verbally and offered multiple options however patient continued to escalate and then was standing in the hall refusing to go back to his room yelling at the police.  At this time because unclear what is going on with the patient he was given IM medication for agitation so a full workup could be done to ensure he is medically okay.  Patient received Geodon and now is more calm.  He will need CT of the head, chest x-ray, EKG and labs.  CRITICAL CARE Performed by: Thelma Viana Total critical care time: 30 minutes Critical care time was exclusive of separately billable procedures and treating other patients. Critical care was necessary to treat or prevent imminent or life-threatening deterioration. Critical care was time spent personally by me on the following activities: development of treatment plan with patient and/or surrogate as well as nursing, discussions with consultants, evaluation of patient's response to treatment, examination of patient, obtaining history from patient or surrogate, ordering and performing treatments and interventions, ordering and review of laboratory studies, ordering and review of radiographic studies, pulse oximetry and re-evaluation of patient's condition.          Final Clinical Impression(s) / ED Diagnoses Final diagnoses:  None    Rx / DC Orders ED Discharge Orders     None         Gwyneth Sprout, MD 09/02/22 2329

## 2022-09-02 NOTE — ED Triage Notes (Signed)
Pt arrives via [facility], per [employer] pt drove up onto sidewalk and when he got out of the car was paranoid. They attempted to help pt but he walked into the road. He then sat on the floor and had a 2-30 sec full body seizure. When pt awoke he continued to become paranoid. Given 5mg  IM versed by EMS. Pt arrives in restraints and very paranoid.

## 2022-09-02 NOTE — ED Provider Notes (Signed)
  Provider Note MRN:  [id number]  Arrival date & time: 09/03/22    ED Course and Medical Decision Making  Assumed care from Dr. Anitra Lauth at shift change.  Agitation possibly in the setting of alcohol or drugs, no focal neurological deficits awaiting labs, CT imaging.  1 AM update: On my exam patient is resting comfortably.  Received Geodon for persistent agitation.  Labs reveal prominent acute kidney injury, leukocytosis.  Will admit to medicine.  Starting IV fluids.  .Critical Care  Performed by: Sabas Sous, MD Authorized by: Sabas Sous, MD   Critical care provider statement:    Critical care time (minutes):  35   Critical care was necessary to treat or prevent imminent or life-threatening deterioration of the following conditions:  Renal failure   Critical care was time spent personally by me on the following activities:  Development of treatment plan with patient or surrogate, discussions with consultants, evaluation of patient's response to treatment, examination of patient, ordering and review of laboratory studies, ordering and review of radiographic studies, ordering and performing treatments and interventions, pulse oximetry, re-evaluation of patient's condition and review of old charts   Final Clinical Impressions(s) / ED Diagnoses     ICD-10-CM   1. Acute renal failure, unspecified acute renal failure type (HCC)  N17.9     2. Altered mental status, unspecified altered mental status type  R41.82       ED Discharge Orders     None       Discharge Instructions   None     Elmer Sow. Pilar Plate, MD [facility] [email or web address]    Sabas Sous, MD 09/03/22 [phone]

## 2022-09-03 ENCOUNTER — Other Ambulatory Visit: Payer: Self-pay

## 2022-09-03 ENCOUNTER — Inpatient Hospital Stay (HOSPITAL_COMMUNITY): Payer: Medicare HMO

## 2022-09-03 ENCOUNTER — Observation Stay (HOSPITAL_COMMUNITY): Payer: Medicare HMO

## 2022-09-03 DIAGNOSIS — E878 Other disorders of electrolyte and fluid balance, not elsewhere classified: Secondary | ICD-10-CM | POA: Diagnosis not present

## 2022-09-03 DIAGNOSIS — F419 Anxiety disorder, unspecified: Secondary | ICD-10-CM | POA: Diagnosis present

## 2022-09-03 DIAGNOSIS — F1590 Other stimulant use, unspecified, uncomplicated: Secondary | ICD-10-CM | POA: Diagnosis present

## 2022-09-03 DIAGNOSIS — M6282 Rhabdomyolysis: Secondary | ICD-10-CM | POA: Diagnosis present

## 2022-09-03 DIAGNOSIS — I509 Heart failure, unspecified: Secondary | ICD-10-CM | POA: Diagnosis not present

## 2022-09-03 DIAGNOSIS — R4182 Altered mental status, unspecified: Secondary | ICD-10-CM | POA: Diagnosis present

## 2022-09-03 DIAGNOSIS — R569 Unspecified convulsions: Secondary | ICD-10-CM | POA: Diagnosis not present

## 2022-09-03 DIAGNOSIS — E876 Hypokalemia: Secondary | ICD-10-CM | POA: Insufficient documentation

## 2022-09-03 DIAGNOSIS — F14959 Cocaine use, unspecified with cocaine-induced psychotic disorder, unspecified: Secondary | ICD-10-CM | POA: Diagnosis not present

## 2022-09-03 DIAGNOSIS — M1612 Unilateral primary osteoarthritis, left hip: Secondary | ICD-10-CM | POA: Diagnosis present

## 2022-09-03 DIAGNOSIS — F429 Obsessive-compulsive disorder, unspecified: Secondary | ICD-10-CM | POA: Diagnosis present

## 2022-09-03 DIAGNOSIS — F600 Paranoid personality disorder: Secondary | ICD-10-CM | POA: Diagnosis not present

## 2022-09-03 DIAGNOSIS — E785 Hyperlipidemia, unspecified: Secondary | ICD-10-CM | POA: Diagnosis present

## 2022-09-03 DIAGNOSIS — Z794 Long term (current) use of insulin: Secondary | ICD-10-CM | POA: Diagnosis not present

## 2022-09-03 DIAGNOSIS — E1140 Type 2 diabetes mellitus with diabetic neuropathy, unspecified: Secondary | ICD-10-CM | POA: Diagnosis not present

## 2022-09-03 DIAGNOSIS — F1494 Cocaine use, unspecified with cocaine-induced mood disorder: Secondary | ICD-10-CM | POA: Diagnosis not present

## 2022-09-03 DIAGNOSIS — A530 Latent syphilis, unspecified as early or late: Secondary | ICD-10-CM | POA: Diagnosis not present

## 2022-09-03 DIAGNOSIS — I110 Hypertensive heart disease with heart failure: Secondary | ICD-10-CM | POA: Diagnosis not present

## 2022-09-03 DIAGNOSIS — E669 Obesity, unspecified: Secondary | ICD-10-CM | POA: Diagnosis not present

## 2022-09-03 DIAGNOSIS — I252 Old myocardial infarction: Secondary | ICD-10-CM | POA: Diagnosis not present

## 2022-09-03 DIAGNOSIS — K219 Gastro-esophageal reflux disease without esophagitis: Secondary | ICD-10-CM | POA: Diagnosis present

## 2022-09-03 DIAGNOSIS — G8929 Other chronic pain: Secondary | ICD-10-CM | POA: Diagnosis not present

## 2022-09-03 DIAGNOSIS — E860 Dehydration: Secondary | ICD-10-CM | POA: Diagnosis present

## 2022-09-03 DIAGNOSIS — F319 Bipolar disorder, unspecified: Secondary | ICD-10-CM | POA: Diagnosis present

## 2022-09-03 DIAGNOSIS — F1994 Other psychoactive substance use, unspecified with psychoactive substance-induced mood disorder: Secondary | ICD-10-CM | POA: Diagnosis not present

## 2022-09-03 DIAGNOSIS — Z6838 Body mass index (BMI) 38.0-38.9, adult: Secondary | ICD-10-CM | POA: Diagnosis not present

## 2022-09-03 DIAGNOSIS — N179 Acute kidney failure, unspecified: Secondary | ICD-10-CM | POA: Diagnosis not present

## 2022-09-03 DIAGNOSIS — F19929 Other psychoactive substance use, unspecified with intoxication, unspecified: Secondary | ICD-10-CM | POA: Diagnosis not present

## 2022-09-03 DIAGNOSIS — A523 Neurosyphilis, unspecified: Secondary | ICD-10-CM | POA: Diagnosis not present

## 2022-09-03 DIAGNOSIS — H9190 Unspecified hearing loss, unspecified ear: Secondary | ICD-10-CM | POA: Diagnosis present

## 2022-09-03 LAB — BASIC METABOLIC PANEL
Anion gap: 13 (ref 5–15)
BUN: 23 mg/dL (ref 8–23)
CO2: 22 mmol/L (ref 22–32)
Calcium: 8.8 mg/dL — ABNORMAL LOW (ref 8.9–10.3)
Chloride: 102 mmol/L (ref 98–111)
Creatinine, Ser: 1.64 mg/dL — ABNORMAL HIGH (ref 0.61–1.24)
GFR, Estimated: 47 mL/min — ABNORMAL LOW (ref >60)
Glucose, Bld: 169 mg/dL — ABNORMAL HIGH (ref 70–99)
Potassium: 2.8 mmol/L — ABNORMAL LOW (ref 3.5–5.1)
Sodium: 137 mmol/L (ref 135–145)

## 2022-09-03 LAB — COMPREHENSIVE METABOLIC PANEL
ALT: 29 U/L (ref 0–44)
AST: 34 U/L (ref 15–41)
Albumin: 3.3 g/dL — ABNORMAL LOW (ref 3.5–5.0)
Alkaline Phosphatase: 78 U/L (ref 38–126)
Anion gap: 15 (ref 5–15)
BUN: 24 mg/dL — ABNORMAL HIGH (ref 8–23)
CO2: 18 mmol/L — ABNORMAL LOW (ref 22–32)
Calcium: 8.6 mg/dL — ABNORMAL LOW (ref 8.9–10.3)
Chloride: 102 mmol/L (ref 98–111)
Creatinine, Ser: 2.19 mg/dL — ABNORMAL HIGH (ref 0.61–1.24)
GFR, Estimated: 33 mL/min — ABNORMAL LOW (ref >60)
Glucose, Bld: 134 mg/dL — ABNORMAL HIGH (ref 70–99)
Potassium: 3.4 mmol/L — ABNORMAL LOW (ref 3.5–5.1)
Sodium: 135 mmol/L (ref 135–145)
Total Bilirubin: 1.2 mg/dL (ref 0.3–1.2)
Total Protein: 7.6 g/dL (ref 6.5–8.1)

## 2022-09-03 LAB — BASIC METABOLIC PANEL WITH GFR
Anion gap: 12 (ref 5–15)
BUN: 23 mg/dL (ref 8–23)
CO2: 25 mmol/L (ref 22–32)
Calcium: 8.9 mg/dL (ref 8.9–10.3)
Chloride: 102 mmol/L (ref 98–111)
Creatinine, Ser: 1.45 mg/dL — ABNORMAL HIGH (ref 0.61–1.24)
GFR, Estimated: 55 mL/min — ABNORMAL LOW
Glucose, Bld: 152 mg/dL — ABNORMAL HIGH (ref 70–99)
Potassium: 3.2 mmol/L — ABNORMAL LOW (ref 3.5–5.1)
Sodium: 139 mmol/L (ref 135–145)

## 2022-09-03 LAB — URINALYSIS, ROUTINE W REFLEX MICROSCOPIC
Bilirubin Urine: NEGATIVE
Glucose, UA: NEGATIVE mg/dL
Hgb urine dipstick: NEGATIVE
Ketones, ur: NEGATIVE mg/dL
Leukocytes,Ua: NEGATIVE
Nitrite: NEGATIVE
Protein, ur: 100 mg/dL — AB
Specific Gravity, Urine: 1.015 (ref 1.005–1.030)
pH: 5 (ref 5.0–8.0)

## 2022-09-03 LAB — COMPREHENSIVE METABOLIC PANEL WITH GFR
ALT: 38 U/L (ref 0–44)
AST: 40 U/L (ref 15–41)
Albumin: 4.2 g/dL (ref 3.5–5.0)
Alkaline Phosphatase: 90 U/L (ref 38–126)
Anion gap: 21 — ABNORMAL HIGH (ref 5–15)
BUN: 26 mg/dL — ABNORMAL HIGH (ref 8–23)
CO2: 19 mmol/L — ABNORMAL LOW (ref 22–32)
Calcium: 9.9 mg/dL (ref 8.9–10.3)
Chloride: 98 mmol/L (ref 98–111)
Creatinine, Ser: 3.23 mg/dL — ABNORMAL HIGH (ref 0.61–1.24)
GFR, Estimated: 21 mL/min — ABNORMAL LOW (ref >60)
Glucose, Bld: 119 mg/dL — ABNORMAL HIGH (ref 70–99)
Potassium: 2.9 mmol/L — ABNORMAL LOW (ref 3.5–5.1)
Sodium: 138 mmol/L (ref 135–145)
Total Bilirubin: 1.2 mg/dL (ref 0.3–1.2)
Total Protein: 9.2 g/dL — ABNORMAL HIGH (ref 6.5–8.1)

## 2022-09-03 LAB — CBC WITH DIFFERENTIAL/PLATELET
Abs Immature Granulocytes: 0.13 10*3/uL — ABNORMAL HIGH (ref 0.00–0.07)
Basophils Absolute: 0.1 10*3/uL (ref 0.0–0.1)
Basophils Relative: 0 %
Eosinophils Absolute: 0 10*3/uL (ref 0.0–0.5)
Eosinophils Relative: 0 %
HCT: 43.3 % (ref 39.0–52.0)
Hemoglobin: 13.9 g/dL (ref 13.0–17.0)
Immature Granulocytes: 1 %
Lymphocytes Relative: 11 %
Lymphs Abs: 2.5 10*3/uL (ref 0.7–4.0)
MCH: 28.4 pg (ref 26.0–34.0)
MCHC: 32.1 g/dL (ref 30.0–36.0)
MCV: 88.5 fL (ref 80.0–100.0)
Monocytes Absolute: 2 10*3/uL — ABNORMAL HIGH (ref 0.1–1.0)
Monocytes Relative: 9 %
Neutro Abs: 17.8 10*3/uL — ABNORMAL HIGH (ref 1.7–7.7)
Neutrophils Relative %: 79 %
Platelets: 290 10*3/uL (ref 150–400)
RBC: 4.89 MIL/uL (ref 4.22–5.81)
RDW: 14.1 % (ref 11.5–15.5)
WBC: 22.5 10*3/uL — ABNORMAL HIGH (ref 4.0–10.5)
nRBC: 0 % (ref 0.0–0.2)

## 2022-09-03 LAB — MAGNESIUM
Magnesium: 1.8 mg/dL (ref 1.7–2.4)
Magnesium: 1.7 mg/dL (ref 1.7–2.4)

## 2022-09-03 LAB — CBC
HCT: 36.5 % — ABNORMAL LOW (ref 39.0–52.0)
Hemoglobin: 12 g/dL — ABNORMAL LOW (ref 13.0–17.0)
MCH: 30 pg (ref 26.0–34.0)
MCHC: 32.9 g/dL (ref 30.0–36.0)
MCV: 91.3 fL (ref 80.0–100.0)
Platelets: 250 10*3/uL (ref 150–400)
RBC: 4 MIL/uL — ABNORMAL LOW (ref 4.22–5.81)
RDW: 14.4 % (ref 11.5–15.5)
WBC: 20.3 10*3/uL — ABNORMAL HIGH (ref 4.0–10.5)
nRBC: 0 % (ref 0.0–0.2)

## 2022-09-03 LAB — HEMOGLOBIN A1C
Hgb A1c MFr Bld: 6.4 % — ABNORMAL HIGH (ref 4.8–5.6)
Mean Plasma Glucose: 136.98 mg/dL

## 2022-09-03 LAB — CK
Total CK: 1162 U/L — ABNORMAL HIGH (ref 49–397)
Total CK: 1003 U/L — ABNORMAL HIGH (ref 49–397)

## 2022-09-03 LAB — SODIUM, URINE, RANDOM: Sodium, Ur: 18 mmol/L

## 2022-09-03 LAB — HIV ANTIBODY (ROUTINE TESTING W REFLEX): HIV Screen 4th Generation wRfx: NONREACTIVE

## 2022-09-03 LAB — SYPHILIS: RPR W/REFLEX TO RPR TITER AND TREPONEMAL ANTIBODIES, TRADITIONAL SCREENING AND DIAGNOSIS ALGORITHM
RPR Ser Ql: REACTIVE — AB
RPR Titer: 1:16 {titer}

## 2022-09-03 LAB — AMMONIA: Ammonia: 33 umol/L (ref 9–35)

## 2022-09-03 LAB — CREATININE, URINE, RANDOM: Creatinine, Urine: 324 mg/dL

## 2022-09-03 LAB — TROPONIN I (HIGH SENSITIVITY): Troponin I (High Sensitivity): 16 ng/L (ref <18)

## 2022-09-03 LAB — BRAIN NATRIURETIC PEPTIDE: B Natriuretic Peptide: 47.1 pg/mL (ref 0.0–100.0)

## 2022-09-03 LAB — ETHANOL: Alcohol, Ethyl (B): <10 mg/dL (ref <10)

## 2022-09-03 MED ORDER — QUETIAPINE FUMARATE 100 MG PO TABS
100.0000 mg | ORAL_TABLET | Freq: Every day | ORAL | Status: DC
  Administered 2022-09-03: 100 mg via ORAL
  Filled 2022-09-03: qty 1

## 2022-09-03 MED ORDER — POTASSIUM CHLORIDE 10 MEQ/100ML IV SOLN
10.0000 meq | INTRAVENOUS | Status: DC
  Administered 2022-09-03 (×4): 10 meq via INTRAVENOUS
  Filled 2022-09-03 (×5): qty 100

## 2022-09-03 MED ORDER — POTASSIUM CHLORIDE CRYS ER 20 MEQ PO TBCR
40.0000 meq | EXTENDED_RELEASE_TABLET | Freq: Once | ORAL | Status: AC
  Administered 2022-09-03: 40 meq via ORAL
  Filled 2022-09-03: qty 2

## 2022-09-03 MED ORDER — PANTOPRAZOLE SODIUM 40 MG PO TBEC
40.0000 mg | DELAYED_RELEASE_TABLET | Freq: Every day | ORAL | Status: DC
  Administered 2022-09-03 – 2022-09-22 (×19): 40 mg via ORAL
  Filled 2022-09-03 (×21): qty 1

## 2022-09-03 MED ORDER — LACTATED RINGERS IV BOLUS
1000.0000 mL | Freq: Once | INTRAVENOUS | Status: AC
  Administered 2022-09-03: 1000 mL via INTRAVENOUS

## 2022-09-03 MED ORDER — POTASSIUM CHLORIDE CRYS ER 20 MEQ PO TBCR: 40.0000 meq | EXTENDED_RELEASE_TABLET | Freq: Once | ORAL | Status: DC

## 2022-09-03 MED ORDER — LACTATED RINGERS IV SOLN: INTRAVENOUS | Status: DC

## 2022-09-03 MED ORDER — ENOXAPARIN SODIUM 30 MG/0.3ML IJ SOSY
30.0000 mg | PREFILLED_SYRINGE | INTRAMUSCULAR | Status: DC
  Administered 2022-09-03 – 2022-09-04 (×2): 30 mg via SUBCUTANEOUS
  Filled 2022-09-03 (×2): qty 0.3

## 2022-09-03 MED ORDER — TAMSULOSIN HCL 0.4 MG PO CAPS
0.4000 mg | ORAL_CAPSULE | Freq: Every day | ORAL | Status: DC
  Administered 2022-09-03 – 2022-09-22 (×20): 0.4 mg via ORAL
  Filled 2022-09-03 (×20): qty 1

## 2022-09-03 MED ORDER — QUETIAPINE FUMARATE 25 MG PO TABS
25.0000 mg | ORAL_TABLET | Freq: Two times a day (BID) | ORAL | Status: DC | PRN
  Administered 2022-09-03 – 2022-09-04 (×2): 25 mg via ORAL
  Filled 2022-09-03 (×3): qty 1

## 2022-09-03 MED ORDER — ACETAMINOPHEN 650 MG RE SUPP: 650.0000 mg | Freq: Four times a day (QID) | RECTAL | Status: DC | PRN

## 2022-09-03 MED ORDER — FLUOXETINE HCL 20 MG PO CAPS
40.0000 mg | ORAL_CAPSULE | Freq: Every day | ORAL | Status: DC
  Administered 2022-09-03 – 2022-09-05 (×3): 40 mg via ORAL
  Filled 2022-09-03 (×3): qty 2

## 2022-09-03 MED ORDER — MIDAZOLAM HCL 2 MG/2ML IJ SOLN: 2.0000 mg | INTRAMUSCULAR | Status: DC | PRN

## 2022-09-03 MED ORDER — GADOBUTROL 1 MMOL/ML IV SOLN
9.5000 mL | Freq: Once | INTRAVENOUS | Status: AC | PRN
  Administered 2022-09-03: 9.5 mL via INTRAVENOUS

## 2022-09-03 MED ORDER — FLUTICASONE PROPIONATE 50 MCG/ACT NA SUSP
2.0000 | Freq: Every day | NASAL | Status: DC
  Administered 2022-09-05 – 2022-09-22 (×10): 2 via NASAL
  Filled 2022-09-03 (×3): qty 16

## 2022-09-03 MED ORDER — ACETAMINOPHEN 325 MG PO TABS
650.0000 mg | ORAL_TABLET | Freq: Four times a day (QID) | ORAL | Status: DC | PRN
  Administered 2022-09-04 – 2022-09-05 (×2): 650 mg via ORAL
  Filled 2022-09-03 (×3): qty 2

## 2022-09-03 MED ORDER — METOPROLOL TARTRATE 25 MG PO TABS
25.0000 mg | ORAL_TABLET | Freq: Two times a day (BID) | ORAL | Status: DC
  Administered 2022-09-03 – 2022-09-22 (×34): 25 mg via ORAL
  Filled 2022-09-03 (×39): qty 1

## 2022-09-03 NOTE — Assessment & Plan Note (Addendum)
-   Admit to [facility] progressive w/ attending Dr. Deirdre Priest - s/p Geodon in ED for agitation. Can consider prn Haldol if agitated - If seizure >30min, give ativan - f/u UDS - Cardiac monitoring - Fall and seizure precaution - NPO

## 2022-09-03 NOTE — Plan of Care (Signed)
  Problem: Education: Goal: Expressions of having a comfortable level of knowledge regarding the disease process will increase Outcome: Progressing   Problem: Coping: Goal: Ability to adjust to condition or change in health will improve Outcome: Progressing Goal: Ability to identify appropriate support needs will improve Outcome: Progressing   Problem: Health Behavior/Discharge Planning: Goal: Compliance with prescribed medication regimen will improve Outcome: Progressing   Problem: Medication: Goal: Risk for medication side effects will decrease Outcome: Progressing   Problem: Clinical Measurements: Goal: Complications related to the disease process, condition or treatment will be avoided or minimized Outcome: Progressing Goal: Diagnostic test results will improve Outcome: Progressing   Problem: Safety: Goal: Verbalization of understanding the information provided will improve Outcome: Progressing   Problem: Self-Concept: Goal: Level of anxiety will decrease Outcome: Progressing Goal: Ability to verbalize feelings about condition will improve Outcome: Progressing   Problem: Education: Goal: Knowledge of General Education information will improve Description: Including pain rating scale, medication(s)/side effects and non-pharmacologic comfort measures Outcome: Progressing   Problem: Health Behavior/Discharge Planning: Goal: Ability to manage health-related needs will improve Outcome: Progressing   Problem: Clinical Measurements: Goal: Ability to maintain clinical measurements within normal limits will improve Outcome: Progressing Goal: Will remain free from infection Outcome: Progressing Goal: Diagnostic test results will improve Outcome: Progressing Goal: Respiratory complications will improve Outcome: Progressing Goal: Cardiovascular complication will be avoided Outcome: Progressing   Problem: Nutrition: Goal: Adequate nutrition will be maintained Outcome:  Progressing   Problem: Coping: Goal: Level of anxiety will decrease Outcome: Progressing   Problem: Elimination: Goal: Will not experience complications related to bowel motility Outcome: Progressing Goal: Will not experience complications related to urinary retention Outcome: Progressing   Problem: Pain Managment: Goal: General experience of comfort will improve Outcome: Progressing   Problem: Safety: Goal: Ability to remain free from injury will improve Outcome: Progressing   Problem: Skin Integrity: Goal: Risk for impaired skin integrity will decrease Outcome: Progressing   

## 2022-09-03 NOTE — ED Notes (Signed)
ED TO INPATIENT HANDOFF REPORT  ED Nurse Name and Phone #: [phone]  S Name/Age/Gender Cody Nolan 62 y.o. male Room/Bed: 004C/004C  Code Status   Code Status: Full Code  Home/SNF/Other Home Patient oriented to: self, place, and time Is this baseline? No   Triage Complete: Triage complete  Chief Complaint AMS (altered mental status) [R41.82]  Triage Note Pt arrives via [facility], per [employer] pt drove up onto sidewalk and when he got out of the car was paranoid. They attempted to help pt but he walked into the road. He then sat on the floor and had a 2-30 sec full body seizure. When pt awoke he continued to become paranoid. Given 5mg  IM versed by EMS. Pt arrives in restraints and very paranoid.   Allergies Allergies  Allergen Reactions   Aleve [Naproxen Sodium] Anaphylaxis   Lisinopril Other (See Comments)    COUGH    Metformin And Related Diarrhea    Level of Care/Admitting Diagnosis ED Disposition     ED Disposition  Admit   Condition  --   Comment  Hospital Area: [facility] [[id number]]  Level of Care: Progressive [102]  Admit to Progressive based on following criteria: NEUROLOGICAL AND NEUROSURGICAL complex patients with significant risk of instability, who do not meet ICU criteria, yet require close observation or frequent assessment (< / = every 2 - 4 hours) with medical / nursing intervention.  Admit to Progressive based on following criteria: ACUTE MENTAL DISORDER-RELATED Drug/Alcohol Ingestion/Overdose/Withdrawal, Suicidal Ideation/attempt requiring safety sitter and < Q2h monitoring/assessments, moderate to severe agitation that is managed with medication/sitter, CIWA-Ar score < 20.  May place patient in observation at [facility] or [facility] if equivalent level of care is available:: No  Covid Evaluation: Asymptomatic - no recent exposure (last 10 days) testing not required  Diagnosis: AMS (altered mental status) [[id number]]  Admitting Physician:  Lincoln Brigham [[id number]]  Attending Physician: Carney Living [[id number]]          B Medical/Surgery History Past Medical History:  Diagnosis Date   CHF (congestive heart failure) (HCC)    Depression    Diabetes mellitus    Hypertension    Myocardial infarction (HCC)    Sleep apnea    Sleep apnea    Tachycardia    Past Surgical History:  Procedure Laterality Date   CHOLECYSTECTOMY     knee  3 knee surgeries     A IV Location/Drains/Wounds Patient Lines/Drains/Airways Status     Active Line/Drains/Airways     Name Placement date Placement time Site Days   Peripheral IV 09/03/22 Anterior;Distal;Left Forearm 09/03/22  0202  Forearm  less than 1            Intake/Output Last 24 hours No intake or output data in the 24 hours ending 09/03/22 0733  Labs/Imaging Results for orders placed or performed during the hospital encounter of 09/02/22 (from the past 48 hour(s))  Comprehensive metabolic panel     Status: Abnormal   Collection Time: 09/02/22 10:01 PM  Result Value Ref Range   Sodium 138 135 - 145 mmol/L   Potassium 2.9 (L) 3.5 - 5.1 mmol/L   Chloride 98 98 - 111 mmol/L   CO2 19 (L) 22 - 32 mmol/L   Glucose, Bld 119 (H) 70 - 99 mg/dL    Comment: Glucose reference range applies only to samples taken after fasting for at least 8 hours.   BUN 26 (H) 8 - 23 mg/dL   Creatinine,  Ser 3.23 (H) 0.61 - 1.24 mg/dL   Calcium 9.9 8.9 - 78.2 mg/dL   Total Protein 9.2 (H) 6.5 - 8.1 g/dL   Albumin 4.2 3.5 - 5.0 g/dL   AST 40 15 - 41 U/L   ALT 38 0 - 44 U/L   Alkaline Phosphatase 90 38 - 126 U/L   Total Bilirubin 1.2 0.3 - 1.2 mg/dL   GFR, Estimated 21 (L) >60 mL/min    Comment: (NOTE) Calculated using the CKD-EPI Creatinine Equation (2021)    Anion gap 21 (H) 5 - 15    Comment: ELECTROLYTES REPEATED TO VERIFY Performed at [facility], [address] [place], [place] [address]   Troponin I (High Sensitivity)     Status: None   Collection Time: 09/02/22  10:01 PM  Result Value Ref Range   Troponin I (High Sensitivity) 16 <18 ng/L    Comment: (NOTE) Elevated high sensitivity troponin I (hsTnI) values and significant  changes across serial measurements may suggest ACS but many other  chronic and acute conditions are known to elevate hsTnI results.  Refer to the "Links" section for chest pain algorithms and additional  guidance. Performed at [facility], [address] [place], [place] [address]   Brain natriuretic peptide     Status: None   Collection Time: 09/02/22 10:01 PM  Result Value Ref Range   B Natriuretic Peptide 47.1 0.0 - 100.0 pg/mL    Comment: Performed at [facility], [address] [place], [place] [address]  Ethanol     Status: None   Collection Time: 09/02/22 10:01 PM  Result Value Ref Range   Alcohol, Ethyl (B) <10 <10 mg/dL    Comment: (NOTE) Lowest detectable limit for serum alcohol is 10 mg/dL.  For medical purposes only. Performed at [facility], [address], [place], [place] [address]   Ammonia     Status: None   Collection Time: 09/02/22 10:01 PM  Result Value Ref Range   Ammonia 33 9 - 35 umol/L    Comment: Performed at [facility], [address], [place], [place] [address]  CBC with Differential/Platelet     Status: Abnormal   Collection Time: 09/02/22 10:01 PM  Result Value Ref Range   WBC 22.5 (H) 4.0 - 10.5 K/uL   RBC 4.89 4.22 - 5.81 MIL/uL   Hemoglobin 13.9 13.0 - 17.0 g/dL   HCT 44.0 10.2 - 72.5 %   MCV 88.5 80.0 - 100.0 fL   MCH 28.4 26.0 - 34.0 pg   MCHC 32.1 30.0 - 36.0 g/dL   RDW 36.6 44.0 - 34.7 %   Platelets 290 150 - 400 K/uL   nRBC 0.0 0.0 - 0.2 %   Neutrophils Relative % 79 %   Neutro Abs 17.8 (H) 1.7 - 7.7 K/uL   Lymphocytes Relative 11 %   Lymphs Abs 2.5 0.7 - 4.0 K/uL   Monocytes Relative 9 %   Monocytes Absolute 2.0 (H) 0.1 - 1.0 K/uL   Eosinophils Relative 0 %   Eosinophils Absolute 0.0 0.0 - 0.5 K/uL   Basophils Relative 0 %    Basophils Absolute 0.1 0.0 - 0.1 K/uL   Immature Granulocytes 1 %   Abs Immature Granulocytes 0.13 (H) 0.00 - 0.07 K/uL    Comment: Performed at [facility], [address], [place], [place] [address]  CK     Status: Abnormal   Collection Time: 09/02/22  10:01 PM  Result Value Ref Range   Total CK 1,162 (H) 49 - 397 U/L    Comment: Performed at [facility], [address] [place], [place] [address]  CBC     Status: Abnormal   Collection Time: 09/03/22  3:10 AM  Result Value Ref Range   WBC 20.3 (H) 4.0 - 10.5 K/uL   RBC 4.00 (L) 4.22 - 5.81 MIL/uL   Hemoglobin 12.0 (L) 13.0 - 17.0 g/dL   HCT 97.2 (L) 82.0 - 60.1 %   MCV 91.3 80.0 - 100.0 fL   MCH 30.0 26.0 - 34.0 pg   MCHC 32.9 30.0 - 36.0 g/dL   RDW 56.1 53.7 - 94.3 %   Platelets 250 150 - 400 K/uL   nRBC 0.0 0.0 - 0.2 %    Comment: Performed at [facility], [address], [place], [place] [address]  Comprehensive metabolic panel     Status: Abnormal   Collection Time: 09/03/22  4:24 AM  Result Value Ref Range   Sodium 135 135 - 145 mmol/L   Potassium 3.4 (L) 3.5 - 5.1 mmol/L   Chloride 102 98 - 111 mmol/L   CO2 18 (L) 22 - 32 mmol/L   Glucose, Bld 134 (H) 70 - 99 mg/dL    Comment: Glucose reference range applies only to samples taken after fasting for at least 8 hours.   BUN 24 (H) 8 - 23 mg/dL   Creatinine, Ser 7.09 (H) 0.61 - 1.24 mg/dL   Calcium 8.6 (L) 8.9 - 10.3 mg/dL   Total Protein 7.6 6.5 - 8.1 g/dL   Albumin 3.3 (L) 3.5 - 5.0 g/dL   AST 34 15 - 41 U/L   ALT 29 0 - 44 U/L   Alkaline Phosphatase 78 38 - 126 U/L   Total Bilirubin 1.2 0.3 - 1.2 mg/dL   GFR, Estimated 33 (L) >60 mL/min    Comment: (NOTE) Calculated using the CKD-EPI Creatinine Equation (2021)    Anion gap 15 5 - 15    Comment: Performed at [facility], [address] [place], [place] [address]  Magnesium     Status: None   Collection Time: 09/03/22  4:24 AM  Result Value Ref Range   Magnesium 1.8 1.7 - 2.4 mg/dL     Comment: Performed at [facility], [address], [place], [place] [address]   CT Head Wo Contrast  Result Date: 09/03/2022 CLINICAL DATA:  Altered mental status, seizure EXAM: CT HEAD WITHOUT CONTRAST TECHNIQUE: Contiguous axial images were obtained from the base of the skull through the vertex without intravenous contrast. RADIATION DOSE REDUCTION: This exam was performed according to the departmental dose-optimization program which includes automated exposure control, adjustment of the mA and/or kV according to patient size and/or use of iterative reconstruction technique. COMPARISON:  10/11/2008 FINDINGS: Brain: Normal anatomic configuration. No abnormal intra or extra-axial mass lesion or fluid collection. No abnormal mass effect or midline shift. No evidence of acute intracranial hemorrhage or infarct. Ventricular size is normal. Cerebellum unremarkable. Vascular: Unremarkable Skull: Intact Sinuses/Orbits: Small mucous retention cyst partially visualized within the left maxillary sinus. Paranasal sinuses are otherwise clear. Orbits are unremarkable. Other: Mastoid air cells and middle ear cavities are clear. IMPRESSION: 1. No acute intracranial abnormality. No calvarial fracture. Electronically Signed   By: Helyn Numbers M.D.   On: 09/03/2022 00:15   DG Chest Port 1 View  Result Date: 09/02/2022 CLINICAL DATA:  Seizure  EXAM: PORTABLE CHEST 1 VIEW COMPARISON:  None Available. FINDINGS: Lungs volumes are small, but are symmetric and are clear. No pneumothorax or pleural effusion. Cardiac size within normal limits. Pulmonary vascularity is normal. Osseous structures are age-appropriate. No acute bone abnormality. IMPRESSION: 1. Pulmonary hypoinflation. Electronically Signed   By: Helyn Numbers M.D.   On: 09/02/2022 23:34    Pending Labs Unresulted Labs (From admission, onward)     Start     Ordered   09/03/22 1200  Basic metabolic panel  Once,   R        09/03/22 0408   09/03/22 0647  RPR   Once,   R        09/03/22 0646   09/03/22 0310  HIV Antibody (routine testing w rflx)  (HIV Antibody (Routine testing w reflex) panel)  Once,   R        09/03/22 0309   09/03/22 0115  Sodium, urine, random  Once,   URGENT        09/03/22 0114   09/03/22 0115  Creatinine, urine, random  Once,   URGENT        09/03/22 0114   09/03/22 0114  Urinalysis, Routine w reflex microscopic -Urine, Clean Catch  Once,   URGENT       Question:  Specimen Source  Answer:  Urine, Clean Catch   09/03/22 0114   09/02/22 2327  Rapid urine drug screen (hospital performed)  ONCE - STAT,   STAT        09/02/22 2326            Vitals/Pain Today's Vitals   09/03/22 0500 09/03/22 0530 09/03/22 0600 09/03/22 0638  BP: (!) 103/57 99/64 (!) 102/58   Pulse: (!) 105 (!) 102 (!) 106   Resp: (!) 35 (!) 32 (!) 36   Temp:    98.2 F (36.8 C)  TempSrc:    Oral  SpO2: 91% 95% 93%   Weight:      Height:      PainSc:        Isolation Precautions No active isolations  Medications Medications  enoxaparin (LOVENOX) injection 30 mg (has no administration in time range)  acetaminophen (TYLENOL) tablet 650 mg (has no administration in time range)    Or  acetaminophen (TYLENOL) suppository 650 mg (has no administration in time range)  lactated ringers infusion ( Intravenous New Bag/Given 09/03/22 0336)  potassium chloride 10 mEq in 100 mL IVPB (10 mEq Intravenous New Bag/Given 09/03/22 0641)  midazolam (VERSED) injection 2 mg (has no administration in time range)  ziprasidone (GEODON) injection 20 mg (20 mg Intramuscular Given 09/02/22 2133)  sterile water (preservative free) injection (2 mLs Injection Given 09/02/22 2132)  lactated ringers bolus 1,000 mL (0 mLs Intravenous Stopped 09/03/22 0324)  lactated ringers bolus 1,000 mL (0 mLs Intravenous Stopped 09/03/22 0324)    Mobility non-ambulatory     Focused Assessments Pulmonary Assessment Handoff:  Lung sounds:   O2 Device: Room Air       R Recommendations: See Admitting Provider Note  Report given to:   Additional Notes:

## 2022-09-03 NOTE — Assessment & Plan Note (Signed)
Cr. 3.23>2.19 s/p 2L LR in the ED. Now with LR running at 116mL/hr. FeNa 0.1% suggesting pre-renal etiology. UA without Hgb/bili, lower concern for pigment nephropathy  - Continue fluids, take care with rate and total volume given documented hx of CHF, though no echo available for review and he does not appear volume up at present. BNP 47.

## 2022-09-03 NOTE — Assessment & Plan Note (Addendum)
In the setting of drug use as above. 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 - Continue fluids - Repeat CK, BMP at 1500  - Holding home statin

## 2022-09-03 NOTE — Consult Note (Signed)
Cody Nolan Psychiatry Consult Evaluation  Service Date: September 03, 2022 LOS:  LOS: 0 days    Primary Psychiatric Diagnoses  Substance induced bipolar disorder   Assessment  Cody Nolan is a 62 y.o. male admitted medically for 09/02/2022  7:57 PM for ?seizure vs stroke. He carries the psychiatric diagnoses of depression from PCP and has a past medical history of  CHF, diabetes, HTN, MI, sleep apnea. Psychiatry was consulted for Paranoid and tangential, no documented history of psychosis  by Dr. Marisue Humble.   His current presentation of pressured, tangential speech, grandiosity, lack of sleep, is most consistent with a diagnosis of mania; given recent substance use and absence of formal bipolar diagnosis this is most likely substance induced bipolar disorder. He is currently amenable to both medic alt treatment (seizure, ?stroke) and psychiatric treatment after he is medically stable. Given his alleged bizarre behavior, threats to family, etc he meets criteria for inpt psychiatric hospitalization.  iCurrent outpatient psychotropic medications include prozac and buspirone and historically he has had a unknown response to these medications. He was semi compliant with medications prior to admission as evidenced by fill hx. On initial examination, patient was difficult to engage (spoke continually, became agitated when interrupted) but displayed fairly good insight into the need for treatment. Please see plan below for detailed recommendations.   Diagnoses:  Active Hospital problems: Principal Problem:   AMS (altered mental status) Active Problems:   Controlled diabetes mellitus type 2 with complications (HCC)   History of MI (myocardial infarction)   Rhabdomyolysis   Hypokalemia   AKI (acute kidney injury) (HCC)     Plan   ## Psychiatric Medication Recommendations:  -- s quetiapine 100 mg at bedtime, 25 mg BID PRN agitation   - d/w FM - if neuro starts depakote may cancel this  ##  Medical Decision Making Capacity:  -- functionally lacks at this time due to inability to engage in any sort of discussion   ## Further Work-up:  -- agree with thorough w/u from primary and neuro teams -- awaiting results of UDS, expect + cocaine    -- most recent EKG on 8/3 had QtC of 437 in setting of sinus tach -- Pertinent labwork reviewed earlier this admission includes: UDS pending  ## Disposition:  -- We recommend inpatient psychiatric hospitalization after medical hospitalization. Patient is under voluntary admission status at this time; please IVC if attempts to leave hospital.  ## Behavioral / Environmental:  --  Utilize compassion and acknowledge the patient's experiences while setting clear and realistic expectations for care.    ## Safety and Observation Level:  - Based on my clinical evaluation, I estimate the patient to be at low risk of self harm in the current setting - At this time, we recommend a routine level of observation. This decision is based on my review of the chart including patient's history and current presentation, interview of the patient, mental status examination, and consideration of suicide risk including evaluating suicidal ideation, plan, intent, suicidal or self-harm behaviors, risk factors, and protective factors. This judgment is based on our ability to directly address suicide risk, implement suicide prevention strategies and develop a safety plan while the patient is in the clinical setting. Please contact our team if there is a concern that risk level has changed.    Thank you for this consult request. Recommendations have been communicated to the primary team.  We will continue to follow at this time.   Rowen Wilmer A Rolla Kedzierski  Psychiatric and Social History   Relevant Aspects of Hospital Course:  Admitted on 09/02/2022 for agitation and bizarre behavior after a witnessed seizure.  Patient Report:  Interview was fairly challenging.  When I  entered the room he was leaving a long voicemail to his personal attorney regarding how long his car can be parked by the side of the road.  He was telling the attorney that he is "going somewhere for a long time" because he "had a stroke and needs a pacemaker".  Patient speaks at length about irrelevant topics (gave me his address 4-5 times during interview) and becomes agitated when this is interrupted.  Asks me multiple times to "not going what I read" and listen to him.  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  Describes self as a "pill or heart of his community" who is always walking around the neighborhood with his poodle.  Makes several comments variably about being back on his psychotropics, having been off his psychotropics, etc.  At various points expresses that someone has taken papers out on him.  Cannot name any of these medications and is only prescribed Prozac and buspirone.  He throws at several diagnoses including PTSD; when I asked about his history of trauma he returned to talking about his address.  His eventually it comes out that he has recently relapsed on cocaine.  In a display of insight, he expresses a desire to go to a "dual diagnosis" facility.   Throughout the course of the interview, he denied suicidal thoughts and homicidal thoughts.  He was not responding to internal stimuli.    Psych ROS:  Positive for poor sleep, lability (goes from happy to crying without any connection to what he is talking about), tangentiality, distractibility, grandiosity, flight of ideas, pressured speech.  No overt psychosis outside of?  Paranoia (it does seem that people have taken papers out on him recently).  Deferred evaluation of anxiety and trauma.  Collateral information:  Informed pt I  would call emergency contact without pt objection (pt wasn't answering most direct ?s). Erskine Squibb is his aunt.  [name] has not spoken to him since Friday. Described his manic behavior. Erskine Squibb and her husband are very fearful of him and filed papers on him. He has been jumping up and down, telling lies, etc. She has been taking care of him and everything. She thinks he has been on drugs. This is the first time she has seen him talking like this in a while (had been like this several years ago when he was into drugs "real bad"). He is not welcome to go back to his aunt's house in current condition (and may not be allowed to come back at all).   Psychiatric History:  Information collected from pt, medical record  Prev Dx/Sx: ??? Depression and anxiety from prescriptions, potentially PTSD Current Psych Provider: PCP Home Meds (current): prozac, buspirone Previous Med Trials: unknown Therapy: unknown  Prior ECT: unknown Prior Psych Hospitalization: unknown  Prior Self Harm: denied   Family Psych History: unknown Family Hx suicide: unknown  Social History:   Living Situation: was livign with [name] and her husband  Substance History  Drug use: recent cocaine use   Exam Findings   Psychiatric Specialty Exam:  Presentation  General Appearance: Disheveled  Eye  Contact:Good  Speech:Pressured  Speech Volume:Increased  Handedness:No data recorded  Mood and Affect  Mood:-- (did not state)  Affect:Inappropriate; Labile   Thought Process  Thought Processes:Disorganized; Irrevelant  Descriptions of Associations:Tangential  Orientation:Full (Time, Place and Person)  Thought Content:-- (grandiosity)  Hallucinations:Hallucinations: None  Ideas of Reference:None  Suicidal Thoughts:Suicidal Thoughts: No  Homicidal Thoughts:Homicidal Thoughts: No   Sensorium  Memory:Immediate Fair; Recent Fair; Remote Good  Judgment:Fair  Insight:Fair   Executive Functions   Concentration:Fair  Attention Span:Poor  Recall:Poor (quite repetitive)  Fund of Knowledge:Fair  Language:Good   Psychomotor Activity  Psychomotor Activity:Psychomotor Activity: -- (slight psychomotor agitatoin)   Assets  Assets:Desire for Improvement   Sleep  Sleep:Sleep: Poor    Physical Exam: Vital signs:  Temp:  [98 F (36.7 C)-98.7 F (37.1 C)] 98.2 F (36.8 C) (08/04 1723) Pulse Rate:  [88-119] 91 (08/04 1723) Resp:  [18-36] 20 (08/04 1723) BP: (92-121)/(54-97) 121/97 (08/04 1723) SpO2:  [86 %-99 %] 91 % (08/04 1723) Weight:  [113.4 kg] 113.4 kg (08/03 2311) Physical Exam Eyes:     Conjunctiva/sclera: Conjunctivae normal.  Pulmonary:     Effort: Pulmonary effort is normal.  Neurological:     Mental Status: He is alert.     Blood pressure (!) 121/97, pulse 91, temperature 98.2 F (36.8 C), temperature source Oral, resp. rate 20, height 5\' 8"  (1.727 m), weight 113.4 kg, SpO2 91%. Body mass index is 38.01 kg/m.   Other History   These have been pulled in through the EMR, reviewed, and updated if appropriate.   Family History:   The patient's family history includes Heart disease in his mother; Hypertension in an other family member.  Medical History: Past Medical History:  Diagnosis Date   CHF (congestive heart failure) (HCC)    Depression    Diabetes mellitus    Hypertension    Myocardial infarction (HCC)    Sleep apnea    Sleep apnea    Tachycardia     Surgical History: Past Surgical History:  Procedure Laterality Date   CHOLECYSTECTOMY     knee  3 knee surgeries    Medications:   Current Facility-Administered Medications:    acetaminophen (TYLENOL) tablet 650 mg, 650 mg, Oral, Q6H PRN **OR** acetaminophen (TYLENOL) suppository 650 mg, 650 mg, Rectal, Q6H PRN, Lincoln Brigham, MD   enoxaparin (LOVENOX) injection 30 mg, 30 mg, Subcutaneous, Q24H, Lincoln Brigham, MD, 30 mg at 09/03/22 1355   FLUoxetine (PROZAC) capsule 40 mg, 40 mg,  Oral, Daily, Darnelle Spangle B, MD, 40 mg at 09/03/22 1312   fluticasone (FLONASE) 50 MCG/ACT nasal spray 2 spray, 2 spray, Each Nare, Daily, Alicia Amel, MD   lactated ringers infusion, , Intravenous, Continuous, Lincoln Brigham, MD, Last Rate: 100 mL/hr at 09/03/22 1138, New Bag at 09/03/22 1138   metoprolol tartrate (LOPRESSOR) tablet 25 mg, 25 mg, Oral, BID, Darnelle Spangle B, MD, 25 mg at 09/03/22 1312   midazolam (VERSED) injection 2 mg, 2 mg, Intravenous, PRN, Alicia Amel, MD   pantoprazole (PROTONIX) EC tablet 40 mg, 40 mg, Oral, Daily, Darnelle Spangle B, MD, 40 mg at 09/03/22 1553   QUEtiapine (SEROQUEL) tablet 100 mg, 100 mg, Oral, QHS, Izora Benn A   QUEtiapine (SEROQUEL) tablet 25 mg, 25 mg, Oral, BID PRN, Law Corsino A, 25 mg at 09/03/22 1313   tamsulosin (FLOMAX) capsule 0.4 mg, 0.4 mg, Oral, Daily, Darnelle Spangle B, MD, 0.4 mg at 09/03/22 1314  Allergies: Allergies  Allergen Reactions   Aleve [  Naproxen Sodium] Anaphylaxis   Lisinopril Other (See Comments)    COUGH    Metformin And Related Diarrhea

## 2022-09-03 NOTE — Assessment & Plan Note (Addendum)
-   s/p 2L bolus LR in ED - LR 113ml/hr mIVF - Bladder scans q8h. Can stop if pt is voiding appropriately.

## 2022-09-03 NOTE — Progress Notes (Addendum)
Daily Progress Note Intern Pager: [phone]  Patient name: Cody Nolan Medical record number: [id number] Date of birth: 03/07/1960 Age: 62 y.o. Gender: male  Primary Care Provider: Lockie Mola, MD Consultants: Psychiatry Code Status: Full  Pt Overview and Major Events to Date:  8/4- admitted  Assessment and Plan: Mr. Cody Nolan is a 62 yo male presenting with altered mentation and question of seizure-like activity prior to arrival.  He was brought in by police after being found acting erratically in the community.  He also sat down on the ground and had a shaking episode after which he was paranoid and required Versed in the field.. Pertinent PMH/PSH includes diabetes, hypertension, CHF, prior MI.  -      Hospital     * (Principal) AMS (altered mental status)     Alert and oriented this morning, but paranoid and tangential with  pressured speech. Admits to recent drug use but unclear on timeline  (synthetic cannabinoids "003 something like that" which he believes may  have been "dosed" with crack cocaine or "ice"). Though he does have fairly  significant weakness on his right side which warrants further  investigation. Differential remains broad and includes drug-induced  paranoia/psychosis, primary psychotic disorder (no documented history of  the same but he tells me he has been "crazy as hell" forever and was  previously on "psychotropics"), infectious encephalitis given elevated  WBC, atypical stroke given L-sided weakness, brain tumor such as glioma  with psychotic symptoms and ?seizure.  - MRI W WO Brain to assess for stroke, tumor, radiographic evidence of  encephalitis - UDS pending, but synthetic cannabinoids would not appear - Psych consult for paranoia and tangential, pressured speech - Seizure precautions - PRN Versed for seizures - Neuro to see as well given new onset seizures - If workup above is unrevealing and he does not improve over the next  day  or so, may need LP        Controlled diabetes mellitus type 2 with complications (HCC)     A1c 6.8% one year ago. Tells me he is taking 22 units of Tresiba BID at  home. Glucose has been well-controlled here.  - Repeat A1c - CBGs, no insulin for now, add back as needed         Rhabdomyolysis     In the setting of drug use as above. Low concern for pigment  nephropathy based on UA. - Continue fluids - Repeat CK, BMP at 1500         AKI (acute kidney injury) (HCC)     Cr. 3.23>2.19 s/p 2L LR in the ED. Now with LR running at 177mL/hr.  FeNa 0.1% suggesting pre-renal etiology. UA without Hgb/bili, lower  concern for pigment nephropathy  - Continue fluids, take care with rate and total volume given documented  hx of CHF, though no echo available for review and he does not appear  volume up at present. BNP 47.       FEN/GI: Regular PPx: Lovenox Dispo: Pending further workup, if psychosis fails to resolve, ?psych inpatient      Subjective:  Patient is quite talkative this morning.  Tells me that there are people who are after him trying to take his home and that they were "dosing" him with drugs including crack and "ice".  He is intermittently tearful and quite tangential.  He consistently refers to "them", it is unclear who he is talking about, perhaps a neighbor who is trying  to take his home. Tried to call his aunt who is listed as an emergency contact to gather collateral but her phone goes directly to voicemail.  Objective: Temp:  [98.2 F (36.8 C)-98.7 F (37.1 C)] 98.7 F (37.1 C) (08/04 0845) Pulse Rate:  [88-119] 100 (08/04 0845) Resp:  [18-36] 20 (08/04 0845) BP: (92-110)/(54-70) 97/63 (08/04 0845) SpO2:  [86 %-99 %] 98 % (08/04 0845) Weight:  [113.4 kg] 113.4 kg (08/03 2311) Physical Exam: General: Talkative, tearful, pressured speech with paranoia and tangential Cardiovascular: Tachycardic, regular, without murmur Respiratory: Normal WOB on RA, lungs clear  throughout  Abdomen: Soft, non-tender, non-distended Extremities: Without edema or deformity  Neuro: A&O x4, Does not move left side as well as the right. Weak on shoulder flexion, hip flexion, and hand grip.   Laboratory: Most recent CBC Lab Results  Component Value Date   WBC 20.3 (H) 09/03/2022   HGB 12.0 (L) 09/03/2022   HCT 36.5 (L) 09/03/2022   MCV 91.3 09/03/2022   PLT 250 09/03/2022   Most recent BMP    Latest Ref Rng & Units 09/03/2022    4:24 AM  BMP  Glucose 70 - 99 mg/dL 557   BUN 8 - 23 mg/dL 24   Creatinine 3.22 - 1.24 mg/dL 0.25   Sodium 427 - 062 mmol/L 135   Potassium 3.5 - 5.1 mmol/L 3.4   Chloride 98 - 111 mmol/L 102   CO2 22 - 32 mmol/L 18   Calcium 8.9 - 10.3 mg/dL 8.6    CK 3762   Imaging/Diagnostic Tests: CT Head  FINDINGS: Brain: Normal anatomic configuration. No abnormal intra or extra-axial mass lesion or fluid collection. No abnormal mass effect or midline shift. No evidence of acute intracranial hemorrhage or infarct. Ventricular size is normal. Cerebellum unremarkable.   Vascular: Unremarkable   Skull: Intact   Sinuses/Orbits: Small mucous retention cyst partially visualized within the left maxillary sinus. Paranasal sinuses are otherwise clear. Orbits are unremarkable.   Other: Mastoid air cells and middle ear cavities are clear.   IMPRESSION: 1. No acute intracranial abnormality. No calvarial fracture.     Independently reviewed and agree with radiologist interpretation Alicia Amel, MD 09/03/2022, 9:30 AM  PGY-3, [facility] FPTS Intern pager: [phone], text pages welcome Secure chat group [facility] Teaching Service

## 2022-09-03 NOTE — Assessment & Plan Note (Signed)
>>  ASSESSMENT AND PLAN FOR AMS (ALTERED MENTAL STATUS) WRITTEN ON 09/03/2022  9:19 AM BY Darnelle Spangle B, MD  Alert and oriented this morning, but paranoid and tangential with pressured speech. Admits to recent drug use but unclear on timeline (synthetic cannabinoids "003 something like that" which he believes may have been "dosed" with crack cocaine or "ice"). Though he does have fairly significant weakness on his right side which warrants further investigation. Differential remains broad and includes drug-induced paranoia/psychosis, primary psychotic disorder (no documented history of the same but he tells me he has been "crazy as hell" forever and was previously on "psychotropics"), infectious encephalitis given elevated WBC, atypical stroke given L-sided weakness, brain tumor such as glioma with psychotic symptoms and ?seizure.  - MRI W WO Brain to assess for stroke, tumor, radiographic evidence of encephalitis - UDS pending, but synthetic cannabinoids would not appear - Psych consult for paranoia and tangential, pressured speech - Seizure precautions - PRN Versed for seizures - If seizes in hospital would obtain EEG and neuro consult. - If workup above is unrevealing and he does not improve over the next day or so, may need LP

## 2022-09-03 NOTE — Assessment & Plan Note (Signed)
-   s/p 2L LR bolus in ED - start LR @100ml /hr mIVF - f/u UA

## 2022-09-03 NOTE — H&P (Addendum)
Hospital Admission History and Physical Service Pager: [phone]  Patient name: Cody Nolan Medical record number: [id number] Date of Birth: 1960-11-11 Age: 62 y.o. Gender: male  Primary Care Provider: Lockie Mola, MD Consultants: None Code Status: Full. Assumed given pt does not currently have capacity. Will f/u once pt is less altered. Preferred Emergency Contact:  Contact Information     Name Relation Home Work Mobile   [place] Aunt [phone]        Other Contacts   None on File      Chief Complaint: AMS  Assessment and Plan: Cody Nolan is a 62 y.o. male presenting with AMS and AKI.   Differential for AMS and agitation includes ingestion/toxicity, alcohol withdrawal, manic episode, psychosis, seizure disorder. Ingestion of substances such as cocaine, amphetamines, or PCP is suspected given pinpoint pupils on exam. Alcohol withdrawal is also considered, will check CIWAs. Mania and psychosis are less likely given no known hx of bipolar or psychotic disorder in pt. Head CT negative, making intracranial etiologies such as hemorrhage or stroke less likely. Alcohol level undetectable, making alcohol intoxication less likely. Will obtain UDS to workup possible ingestions.   Differential for AKI includes rhabdomyolysis, drug toxicity, dehydration, post-renal obstruction. Rhabdo is supported by elevated CK and seizure-like activity witnessed by police prior to arrival. Drug toxicity is considered given AMS. Dehydration is considered given volume down on exam. Post-renal obstruction such as urinary retention is considered, will obtain bladder scans. Pt is s/p 2L LR in ED. Will also start mIVF.   [facility]     * (Principal) AMS (altered mental status)     - Admit to FMTS progressive w/ attending Dr. Deirdre Priest - s/p Geodon in ED for agitation. Can consider prn Haldol if agitated - If seizure >46min, give ativan - f/u UDS - Cardiac monitoring - Fall and  seizure precaution - NPO        Rhabdomyolysis     - s/p 2L LR bolus in ED - start LR @100ml /hr mIVF - f/u UA        Hypokalemia     K low to 2.9. - IV K x6 - f/u CMP and Mg        AKI (acute kidney injury) (HCC)     - s/p 2L bolus LR in ED - LR 14ml/hr mIVF     Chronic Conditions: HTN: hold home amlodipine and metoprolol HLD: Hold home lipitor T2DM: Hold home insulin while NPO Diabetic Neuropathy: Hold home gabapentin Anxiety: Hold home Buspar and prozac  FEN/GI: NPO. LR 131ml/hr VTE Prophylaxis: Lovenox (renally reduced dose)  Disposition: Progressive  History of Present Illness:  Cody Nolan is a 62 y.o. male presenting with AMS. Pt too altered to provide hx, hx was provided from ED provider Dr. Pilar Plate and chart review. Attempted to call emergency contact Baron Sane, but no answer.   Earlier today, pt drove his car onto the sidewalk, got out of car, and was yelling at people. Police were called and noticed he had 30 sec seizure where he was shaking all over and then stopped talking. Once seizure was over, he was agitated and started yelling. EMS arrived, gave Versed for seizure and agitation. At that time, pt was reporting that he felt like he was in the middle of a nervous breakdown, talking about legal issues going on, and mentioned something about paperwork.   In ED, pt was still agitated, not allowing staff  to evaluate. Pt was given Geodon 20mg  IM for agitation and subsequently calmed down. Vitals notable for tachycardia to 120s. Labs notable for AKI and hypokalemia. CT head was negative. EKG showed sinus tachycardia. CXR showed hypoinflation. Pt was given 2L LR bolus.  Pertinent Past Medical History: T2DM HLD HTN OSA Hx of MI CHF  Remainder reviewed in history tab.   Pertinent Past Surgical History: Knee surgery Cholecystecomy   Remainder reviewed in history tab.  Pertinent Social History: Unable to obtain  Pertinent Family History: Mother  had heart disease  Remainder reviewed in history tab.   Important Outpatient Medications: Amlodipine Lipitor Gabapentin Tramadol Buspar Metoprolol Prozac  Remainder reviewed in medication history.   Objective: BP 110/67   Pulse (!) 109   Temp 98.5 F (36.9 C) (Oral)   Resp (!) 34   Ht 5\' 8"  (1.727 m)   Wt 113.4 kg   SpO2 (!) 86%   BMI 38.01 kg/m  Exam: General: Sedated obese man, snoring loudly in bed.  Neuro: BL pupils pinpoint and symmetric. Responsive to voice and shaking pt, but mumbling incoherently. Withdraws all extremities to noxious stimuli. HEENT: NCAT. Dry mucous membranes.  Cardiovascular: rapid rate, regular rhythm. No murmurs. Cap refill >2sec.  Respiratory: Snoring loudly. CTAB. Normal WOB on RA. Gastrointestinal: Soft, nontender, nondistended. Skin: warm and diaphoretic  Labs:  CBC BMET  Recent Labs  Lab 09/03/22 0310  WBC 20.3*  HGB 12.0*  HCT 36.5*  PLT 250   Recent Labs  Lab 09/02/22 2201  NA 138  K 2.9*  CL 98  CO2 19*  BUN 26*  CREATININE 3.23*  GLUCOSE 119*  CALCIUM 9.9    CK 1162 BNP wnl Trop wnl Ethanol undetectable  EKG: My own interpretation (not copied from electronic read) Sinus tachycardia, regular intervals and axis.   Imaging Studies Performed:  CT head w/o contrast - No acute intracranial findings  CXR Impression from Radiologist: 1. Pulmonary hypoinflation.    My Interpretation: agree, lungs appear hypoinflated (likely 2/2 body habitus and sedation).   Lincoln Brigham, MD 09/03/2022, 4:14 AM PGY-2, [facility]  FPTS Intern pager: [phone], text pages welcome Secure chat group [facility] Early Teaching Service

## 2022-09-03 NOTE — Assessment & Plan Note (Addendum)
Alert and oriented this morning, but paranoid and tangential with pressured speech. Admits to recent drug use but unclear on timeline (synthetic cannabinoids "003 something like that" which he believes may have been "dosed" with crack cocaine or "ice"). Though he does have fairly significant weakness on his right side which warrants further investigation. Differential remains broad and includes drug-induced paranoia/psychosis, primary psychotic disorder (no documented history of the same but he tells me he has been "crazy as hell" forever and was previously on "psychotropics"), infectious encephalitis given elevated WBC, atypical stroke given L-sided weakness, brain tumor such as glioma with psychotic symptoms and ?seizure.  - MRI W WO Brain to assess for stroke, tumor, radiographic evidence of encephalitis - UDS pending, but synthetic cannabinoids would not appear - Psych consult for paranoia and tangential, pressured speech - Seizure precautions - PRN Versed for seizures - If seizes in hospital would obtain EEG and neuro consult. - If workup above is unrevealing and he does not improve over the next day or so, may need LP

## 2022-09-03 NOTE — Assessment & Plan Note (Signed)
Documented in chart, unable to find more detail.  - On Statin (held in setting of elevated CK) - Home B blocker, lopressor 25mg  BID - Not on ASA, if his mental status clears, will try to gather more information

## 2022-09-03 NOTE — Assessment & Plan Note (Signed)
K low to 2.9. - IV K x6 - f/u CMP and Mg

## 2022-09-03 NOTE — Assessment & Plan Note (Addendum)
A1c 6.8% one year ago. Tells me he is taking 22 units of Tresiba BID at home. Glucose has been well-controlled here.  - Repeat A1c - CBGs, no insulin for now, add back as needed

## 2022-09-03 NOTE — Assessment & Plan Note (Signed)
>>  ASSESSMENT AND PLAN FOR AMS (ALTERED MENTAL STATUS) WRITTEN ON 09/03/2022  4:14 AM BY Lincoln Brigham, MD  - Admit to [facility] w/ attending Dr. Deirdre Priest - s/p Geodon in ED for agitation. Can consider prn Haldol if agitated - If seizure >51min, give ativan - f/u UDS - Cardiac monitoring - Fall and seizure precaution - NPO

## 2022-09-03 NOTE — Consult Note (Addendum)
Neurology Consultation Reason for Consult: Altered mental status, seizure-like activity Referring Physician: Dr. Deirdre Priest  CC: Altered mental status  History is obtained from: Patient and chart review  HPI: Cody Nolan is a 62 y.o. male with past medical history significant for hypertension, diabetes, CHF, depression, sleep apnea, MI who presented to the ED 8/3 with altered mentation and possible seizure-like activity.  He was brought in by police after being found acting erratically in the community.  Per notes, he had a shaking episode after which she was paranoid and required Versed in the field.  He had persistent agitation in the ED and required Geodon. On exam today, patient is oriented to self, place, year.  He does follow commands.  He answers some questions correctly but has very poor attention and concentration.  His erratic restless and storytelling and activity.  He does not mention multiple instances of "them" trying to rob him.  He states that they tried to tell him he had seizures yesterday but that he knows what his seizure is and that he did not have a seizure but he did have 3 strokes yesterday.  He is a poor historian.  He also said that he "fell off the wagon" about 2 weeks ago and goes over to his uncle's house about once every 3 days and gets his "stimulant drugs". He denies "shooting up".    ROS: Unable to obtain due to altered mental status.   Past Medical History:  Diagnosis Date   CHF (congestive heart failure) (HCC)    Depression    Diabetes mellitus    Hypertension    Myocardial infarction [facility])    Sleep apnea    Sleep apnea    Tachycardia     Family History  Problem Relation Age of Onset   Heart disease Mother    Hypertension Other     Social History:  reports that he quit smoking about 2 years ago. His smoking use included cigarettes. He started smoking about 12 years ago. He has a 7.5 pack-year smoking history. He has never used smokeless tobacco.  He reports current alcohol use. He reports that he does not use drugs.  Exam: Current vital signs: BP 97/63 (BP Location: Left Arm)   Pulse 100   Temp 98.7 F (37.1 C) (Oral)   Resp 20   Ht 5\' 8"  (1.727 m)   Wt 113.4 kg   SpO2 98%   BMI 38.01 kg/m  Vital signs in last 24 hours: Temp:  [98.2 F (36.8 C)-98.7 F (37.1 C)] 98.7 F (37.1 C) (08/04 0845) Pulse Rate:  [88-119] 100 (08/04 0845) Resp:  [18-36] 20 (08/04 0845) BP: (92-110)/(54-70) 97/63 (08/04 0845) SpO2:  [86 %-99 %] 98 % (08/04 0845) Weight:  [113.4 kg] 113.4 kg (08/03 2311)   Physical Exam  Appears well-developed and well-nourished.   Neuro: Mental Status: Patient is awake, alert, oriented to self, place, year.  He has very poor attention and concentration.  He is restless and erratic and in activity. Cranial Nerves: II: Visual Fields are full. PERRL. III,IV, VI: EOMI without ptosis or diploplia.  V: Facial sensation is symmetric to temperature VII: Facial movement is symmetric.  VIII: hearing is intact to voice X: Uvula elevates symmetrically XI: Shoulder shrug is symmetric. Head turn symmetric, No neck stiffness.  XII: tongue is midline without atrophy Motor: Tone is normal. Bulk is normal. 5/5 strength was present in all four extremities.  Sensory: Sensation is symmetric to light touch in the arms  and legs. Cerebellar: FNF and HKS are intact bilaterally    I have reviewed labs in epic and the results pertinent to this consultation are: CK: 1162 WBC 22.5 Ammonia: WNL CR: 3.23 => 2.19 BUN: 26 => 24 K: 2.9 => 3.4 UDS: pending  I have reviewed the images obtained: MRI Brain: pending CTH: No acute intracranial abnormality   Impression: Cody Nolan is a 62 y.o. male with past medical history significant for hypertension, diabetes, CHF, depression, sleep apnea, MI who presented to the ED 8/3 with altered mentation and possible seizure-like activity.  He was brought in by police after being  found acting erratically in the community.  Per notes, he had a shaking episode after which she was paranoid and required Versed in the field.  He had persistent agitation in the ED and required Geodon.  Differentials include drug-induced phychosis (UDS pending, but psychotropics would not show), seizures, encephalitis/CNS infectious process is also a possibility given high WBC count and altered mental status, ?stroke given decreased right hand strength.    Recommendations: - MRI Brain  If positive for stroke, will order work-up - rEEG   Can add AEDs if needed based on findings - Other infectious workup and electrolyte mgmt per primary  Pt seen by Neuro NP/APP and later by MD. Note/plan to be edited by MD as needed.    Lynnae January, DNP, AGACNP-BC [facility] Please use AMION for contact information & EPIC for messaging.  I have seen the patient reviewed the above note.  He reports a history of seizure activity, but nothing in the past 15 years, unclear if he was using drugs at the time.  He does admit to "relapsing" for the past couple of weeks and using "stimulants."  He was asked to leave his house a few days ago due to a "several matter" and has been staying in a hotel.  He adamantly refuses to believe the possibility that he may have had a seizure, stating that he does not have these anymore.  If he is to require antiepileptics, I would favor Depakote given his psychiatric presentation, but is not clear to me at this time this was not a provoked seizure given that he is admitting to using stimulants.  This makes substance-induced psychosis much more likely than postictal psychosis.  UDS is pending.  If EEG and MRI are negative, would favor consider this a provoked seizure.  Ritta Slot, MD [facility] [phone]  If 7pm- 7am, please page neurology on call as listed in AMION.

## 2022-09-04 ENCOUNTER — Telehealth: Payer: Self-pay

## 2022-09-04 ENCOUNTER — Other Ambulatory Visit: Payer: Self-pay

## 2022-09-04 ENCOUNTER — Inpatient Hospital Stay (HOSPITAL_COMMUNITY): Payer: Medicare HMO

## 2022-09-04 DIAGNOSIS — R4182 Altered mental status, unspecified: Secondary | ICD-10-CM | POA: Diagnosis not present

## 2022-09-04 DIAGNOSIS — F19929 Other psychoactive substance use, unspecified with intoxication, unspecified: Secondary | ICD-10-CM | POA: Diagnosis not present

## 2022-09-04 DIAGNOSIS — F1994 Other psychoactive substance use, unspecified with psychoactive substance-induced mood disorder: Secondary | ICD-10-CM | POA: Diagnosis not present

## 2022-09-04 DIAGNOSIS — K6289 Other specified diseases of anus and rectum: Secondary | ICD-10-CM | POA: Diagnosis present

## 2022-09-04 DIAGNOSIS — G8929 Other chronic pain: Secondary | ICD-10-CM | POA: Diagnosis present

## 2022-09-04 MED ORDER — CAMPHOR-MENTHOL 0.5-0.5 % EX LOTN: TOPICAL_LOTION | CUTANEOUS | Status: DC | PRN

## 2022-09-04 MED ORDER — ENOXAPARIN SODIUM 60 MG/0.6ML IJ SOSY
50.0000 mg | PREFILLED_SYRINGE | Freq: Every day | INTRAMUSCULAR | Status: DC
  Administered 2022-09-05: 50 mg via SUBCUTANEOUS
  Filled 2022-09-04: qty 0.6

## 2022-09-04 MED ORDER — GABAPENTIN 100 MG PO CAPS
100.0000 mg | ORAL_CAPSULE | Freq: Three times a day (TID) | ORAL | Status: DC
  Administered 2022-09-04 – 2022-09-05 (×3): 100 mg via ORAL
  Filled 2022-09-04 (×3): qty 1

## 2022-09-04 MED ORDER — QUETIAPINE FUMARATE 25 MG PO TABS
150.0000 mg | ORAL_TABLET | Freq: Every day | ORAL | Status: DC
  Administered 2022-09-04 – 2022-09-05 (×2): 150 mg via ORAL
  Filled 2022-09-04 (×2): qty 2

## 2022-09-04 MED ORDER — POTASSIUM CHLORIDE CRYS ER 20 MEQ PO TBCR
40.0000 meq | EXTENDED_RELEASE_TABLET | Freq: Once | ORAL | Status: AC
  Administered 2022-09-04: 40 meq via ORAL
  Filled 2022-09-04: qty 2

## 2022-09-04 MED ORDER — MAGNESIUM SULFATE 2 GM/50ML IV SOLN
2.0000 g | Freq: Once | INTRAVENOUS | Status: AC
  Administered 2022-09-04: 2 g via INTRAVENOUS
  Filled 2022-09-04: qty 50

## 2022-09-04 MED ORDER — OLANZAPINE 10 MG IM SOLR: 2.5000 mg | Freq: Two times a day (BID) | INTRAMUSCULAR | Status: DC | PRN

## 2022-09-04 MED ORDER — HYDROXYZINE HCL 25 MG PO TABS
25.0000 mg | ORAL_TABLET | Freq: Three times a day (TID) | ORAL | Status: DC | PRN
  Administered 2022-09-07 – 2022-09-22 (×33): 25 mg via ORAL
  Filled 2022-09-04 (×35): qty 1

## 2022-09-04 MED ORDER — GERHARDT'S BUTT CREAM
TOPICAL_CREAM | Freq: Two times a day (BID) | CUTANEOUS | Status: DC
  Filled 2022-09-04: qty 1

## 2022-09-04 MED ORDER — CAPSAICIN 0.075 % EX CREA
TOPICAL_CREAM | Freq: Two times a day (BID) | CUTANEOUS | Status: DC
  Filled 2022-09-04: qty 57

## 2022-09-04 MED ORDER — QUETIAPINE FUMARATE 50 MG PO TABS
25.0000 mg | ORAL_TABLET | Freq: Two times a day (BID) | ORAL | Status: DC | PRN
  Administered 2022-09-05 – 2022-09-22 (×33): 25 mg via ORAL
  Filled 2022-09-04 (×35): qty 1

## 2022-09-04 MED ORDER — ZINC OXIDE 40 % EX OINT
TOPICAL_OINTMENT | Freq: Three times a day (TID) | CUTANEOUS | Status: DC
  Filled 2022-09-04: qty 57

## 2022-09-04 NOTE — Progress Notes (Signed)
Patient still has not eaten breakfast, was speaking with resident MD. Patient asked if Tech could hold off on EEG until he has eaten. EEG to be placed later in the day.

## 2022-09-04 NOTE — Assessment & Plan Note (Addendum)
Irritation evident on previous exam. Possibly secondary to inadequate hygiene. - Desitin ointment with each bowel movement - Sarna lotion as needed - Ensure adequately cleaned after bowel movements

## 2022-09-04 NOTE — Progress Notes (Signed)
Patient refused 12 o'clock vitals

## 2022-09-04 NOTE — Plan of Care (Signed)
  Problem: Education: Goal: Expressions of having a comfortable level of knowledge regarding the disease process will increase Outcome: Progressing   Problem: Coping: Goal: Ability to adjust to condition or change in health will improve Outcome: Progressing Goal: Ability to identify appropriate support needs will improve Outcome: Progressing   Problem: Health Behavior/Discharge Planning: Goal: Compliance with prescribed medication regimen will improve Outcome: Progressing   Problem: Medication: Goal: Risk for medication side effects will decrease Outcome: Progressing   Problem: Clinical Measurements: Goal: Complications related to the disease process, condition or treatment will be avoided or minimized Outcome: Progressing Goal: Diagnostic test results will improve Outcome: Progressing   Problem: Safety: Goal: Verbalization of understanding the information provided will improve Outcome: Progressing   Problem: Self-Concept: Goal: Level of anxiety will decrease Outcome: Progressing Goal: Ability to verbalize feelings about condition will improve Outcome: Progressing   Problem: Education: Goal: Knowledge of General Education information will improve Description: Including pain rating scale, medication(s)/side effects and non-pharmacologic comfort measures Outcome: Progressing   Problem: Health Behavior/Discharge Planning: Goal: Ability to manage health-related needs will improve Outcome: Progressing   Problem: Clinical Measurements: Goal: Ability to maintain clinical measurements within normal limits will improve Outcome: Progressing Goal: Will remain free from infection Outcome: Progressing Goal: Diagnostic test results will improve Outcome: Progressing Goal: Respiratory complications will improve Outcome: Progressing Goal: Cardiovascular complication will be avoided Outcome: Progressing   Problem: Nutrition: Goal: Adequate nutrition will be maintained Outcome:  Progressing   Problem: Coping: Goal: Level of anxiety will decrease Outcome: Progressing   Problem: Elimination: Goal: Will not experience complications related to bowel motility Outcome: Progressing Goal: Will not experience complications related to urinary retention Outcome: Progressing   Problem: Pain Managment: Goal: General experience of comfort will improve Outcome: Progressing   Problem: Safety: Goal: Ability to remain free from injury will improve Outcome: Progressing   Problem: Skin Integrity: Goal: Risk for impaired skin integrity will decrease Outcome: Progressing   

## 2022-09-04 NOTE — Progress Notes (Signed)
EEG complete - results pending 

## 2022-09-04 NOTE — Consult Note (Signed)
WOC Nurse Consult Note: Reason for Consult: rash between buttocks, patient requested evaluation Wound type: incontinence/moisture related; ICD-irritant contact dermatitis  ICD-10 CM Codes for Irritant Dermatitis L24A0 - Due to friction or contact with body fluids; unspecified L30.4  - Erythema intertrigo. Also used for abrasion of the hand, chafing of the skin, dermatitis due to sweating and friction, friction dermatitis, friction eczema, and genital/thigh intertrigo.  Pressure Injury POA: NA Measurement: NA Wound [id number]:[id number], superficial  Drainage (amount, consistency, odor) none Periwound: intact Dressing procedure/placement/frequency: Add Gerhardt's butt cream BID and PRN  Discussed POC with patient and bedside nurse.  Re consult if needed, will not follow at this time. Thanks  Myalynn Lingle [employer], RN,CWOCN, CNS, CWON-AP [phone])

## 2022-09-04 NOTE — Progress Notes (Signed)
Patient asked for his belongings to be returned to him from security. Items were returned to him, 1 wallet with bank card, 1 car key [address], court papers, 477.00 cash. Patient refused to sign security envelope accepting return of items. Patient was asked to return all items to envelope for re-storage to security since he would not sign that he accepted the items. Patient stated he would not sign anything until his attorney "Bernette Redbird" was present. Patient kept in his possession 1 wallet with bank card, 1 car key [address], court papers. 477.00 cash was returned in new envelope to security, signed by 2 RN.

## 2022-09-04 NOTE — Assessment & Plan Note (Signed)
In the setting of drug use as above. 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 CK downtrending. - Continue fluids - Holding home statin

## 2022-09-04 NOTE — Progress Notes (Signed)
CSW completed IVC paperwork and uploaded to ecourts. Case number is [id number]. CSW called PD to have IVC paperwork served to pt.

## 2022-09-04 NOTE — Discharge Instructions (Addendum)
Dear Cody Nolan,   Thank you so much for allowing [place] to be part of your care!  You were admitted to [facility] for confusion and altered mental status.  Your workup for seizure, stroke, and cardiac causes was negative.  With the help of our Psychiatrists, we were able to find a medication regimen that works for you and it is great to see that you are happy with the results.  POST-HOSPITAL & CARE INSTRUCTIONS You can see the recommended Psych offices below.  We recommend calling Dr. Evelene Croon first, we will work on [employer] outpatient.  If you are unable to be seen anywhere and want to urgently speak to a psychiatrist, the [facility] is available at the hours below. You can call the Pain Management doctor's office (Dr. Claudette Laws) at the following number: [phone] We will set you up with an Orthopedic referral and help you with Pain Management if needed at your PCP follow up appointment. Please let PCP/Specialists know of any changes that were made.  Please see medications section of this packet for any medication changes.   DOCTOR'S APPOINTMENT & FOLLOW UP CARE INSTRUCTIONS  Future Appointments  Date Time Provider Department Center  10/03/2022  2:30 PM Nelia Shi, MD [facility]   Take care and be well!  Family Medicine Teaching Service  [place]  [facility]  [address] [place], [place] [address] [phone]   Outpatient Psych Follow-up Recs: Base on the information you have provided and the presenting issue, outpatient services and resources for have been recommended.  It is imperative that you follow through with treatment recommendations within 5-7 days from the of discharge to mitigate further risk to your safety and mental well-being. A list of referrals has been provided below to get you started.  You are not limited to the list provided. In EMERGENCY, you may contact the Mobile Crisis Unit with  [facility] at [phone].  [facility] [address] [place], [place] [address] [phone]  You can also go to the below location during walk-in hours if you are not able to get established anywhere else: [facility] [address] [place], [place], [address] [phone] phone  New Patient Assessment/Therapy Walk-Ins:  Monday and Wednesday: 8 am until slots are full. Every 1st and 2nd Fridays of the month: 1 pm - 5 pm.  NO ASSESSMENT/THERAPY WALK-INS ON TUESDAYS OR THURSDAYS  New Patient Assessment/Medication Management Walk-Ins: Monday - Friday:  8 am - 11 am.  For all walk-ins, we ask that you arrive by 7:30 am because patients will be seen in the order of arrival.  Availability is limited; therefore, you may not be seen on the same day that you walk-in.  Our goal is to serve and meet the needs of our community to the best of our ability.

## 2022-09-04 NOTE — Plan of Care (Signed)
Problem: Education: Goal: Expressions of having a comfortable level of knowledge regarding the disease process will increase 09/04/2022 1152 by Isla Pence, RN Outcome: Progressing 09/04/2022 1150 by Isla Pence, RN Outcome: Progressing   Problem: Coping: Goal: Ability to adjust to condition or change in health will improve 09/04/2022 1152 by Isla Pence, RN Outcome: Progressing 09/04/2022 1150 by Isla Pence, RN Outcome: Progressing Goal: Ability to identify appropriate support needs will improve 09/04/2022 1152 by Isla Pence, RN Outcome: Progressing 09/04/2022 1150 by Isla Pence, RN Outcome: Progressing   Problem: Health Behavior/Discharge Planning: Goal: Compliance with prescribed medication regimen will improve 09/04/2022 1152 by Isla Pence, RN Outcome: Progressing 09/04/2022 1150 by Isla Pence, RN Outcome: Progressing   Problem: Medication: Goal: Risk for medication side effects will decrease 09/04/2022 1152 by Isla Pence, RN Outcome: Progressing 09/04/2022 1150 by Isla Pence, RN Outcome: Progressing   Problem: Clinical Measurements: Goal: Complications related to the disease process, condition or treatment will be avoided or minimized 09/04/2022 1152 by Isla Pence, RN Outcome: Progressing 09/04/2022 1150 by Isla Pence, RN Outcome: Progressing Goal: Diagnostic test results will improve 09/04/2022 1152 by Isla Pence, RN Outcome: Progressing 09/04/2022 1150 by Isla Pence, RN Outcome: Progressing   Problem: Safety: Goal: Verbalization of understanding the information provided will improve 09/04/2022 1152 by Isla Pence, RN Outcome: Progressing 09/04/2022 1150 by Isla Pence, RN Outcome: Progressing   Problem: Self-Concept: Goal: Level of anxiety will decrease 09/04/2022 1152 by Isla Pence, RN Outcome: Progressing 09/04/2022 1150 by Isla Pence, RN Outcome: Progressing Goal:  Ability to verbalize feelings about condition will improve 09/04/2022 1152 by Isla Pence, RN Outcome: Progressing 09/04/2022 1150 by Isla Pence, RN Outcome: Progressing   Problem: Education: Goal: Knowledge of General Education information will improve Description: Including pain rating scale, medication(s)/side effects and non-pharmacologic comfort measures 09/04/2022 1152 by Isla Pence, RN Outcome: Progressing 09/04/2022 1150 by Isla Pence, RN Outcome: Progressing   Problem: Health Behavior/Discharge Planning: Goal: Ability to manage health-related needs will improve 09/04/2022 1152 by Isla Pence, RN Outcome: Progressing 09/04/2022 1150 by Isla Pence, RN Outcome: Progressing   Problem: Clinical Measurements: Goal: Ability to maintain clinical measurements within normal limits will improve 09/04/2022 1152 by Isla Pence, RN Outcome: Progressing 09/04/2022 1150 by Isla Pence, RN Outcome: Progressing Goal: Will remain free from infection 09/04/2022 1152 by Isla Pence, RN Outcome: Progressing 09/04/2022 1150 by Isla Pence, RN Outcome: Progressing Goal: Diagnostic test results will improve 09/04/2022 1152 by Isla Pence, RN Outcome: Progressing 09/04/2022 1150 by Isla Pence, RN Outcome: Progressing Goal: Respiratory complications will improve 09/04/2022 1152 by Isla Pence, RN Outcome: Progressing 09/04/2022 1150 by Isla Pence, RN Outcome: Progressing Goal: Cardiovascular complication will be avoided 09/04/2022 1152 by Isla Pence, RN Outcome: Progressing 09/04/2022 1150 by Isla Pence, RN Outcome: Progressing   Problem: Nutrition: Goal: Adequate nutrition will be maintained 09/04/2022 1152 by Isla Pence, RN Outcome: Progressing 09/04/2022 1150 by Isla Pence, RN Outcome: Progressing   Problem: Coping: Goal: Level of anxiety will decrease 09/04/2022 1152 by Isla Pence,  RN Outcome: Progressing 09/04/2022 1150 by Isla Pence, RN Outcome: Progressing   Problem: Elimination: Goal: Will not experience complications related to bowel motility 09/04/2022 1152 by Isla Pence, RN Outcome: Progressing 09/04/2022 1150 by Isla Pence, RN Outcome: Progressing Goal: Will not experience complications related to urinary retention 09/04/2022 1152 by Isla Pence, RN Outcome: Progressing 09/04/2022 1150 by Isla Pence, RN Outcome: Progressing   Problem: Pain Managment: Goal: General experience of comfort will improve 09/04/2022 1152 by Isla Pence, RN  Outcome: Progressing 09/04/2022 1150 by Isla Pence, RN Outcome: Progressing   Problem: Safety: Goal: Ability to remain free from injury will improve 09/04/2022 1152 by Isla Pence, RN Outcome: Progressing 09/04/2022 1150 by Isla Pence, RN Outcome: Progressing   Problem: Skin Integrity: Goal: Risk for impaired skin integrity will decrease 09/04/2022 1152 by Isla Pence, RN Outcome: Progressing 09/04/2022 1150 by Isla Pence, RN Outcome: Progressing

## 2022-09-04 NOTE — Assessment & Plan Note (Addendum)
Less agitated this morning compared to prior documentation, feels that current psych medications are working well. Ongoing paranoia surrounding medications while inpatient and frustration with IVC status. Differential still includes drug-induced bipolar disorder/psychosis versus neurosyphilis. - Continue IVC due to concern for labile mental status and behavior - May transfer to IP psych when patient completes syphilis 2 week treatment course, but will interface with psych team later - Seizure precautions, PRN Versed for seizures - Syphilis treatment as per primary problem - Appreciate psych consult, psychiatric medications managed per psych: -- Continue depakote 500 mg BID for mania -- Continue 200 mg seroquel at bedtime, 25 mg BID PRN agitation  -- Continue buspirone 10 mg TID for anxiety -- Continue fluoxetine 20 mg daily for depression -- Continue hydroxyzine 25 mg TID PRN for anxiety -- Continue Backup PRN geodon 2.5 mg PRN if pt refuses PO

## 2022-09-04 NOTE — Assessment & Plan Note (Addendum)
XR of back in May 2024 with degenerative changes. Reassured by good mobility, unable to evaluate pain today. - Sarna lotion to affected areas for now, can consider voltaren gel for pain though has allergy to naproxen - Continue gabapentin home dose 600 mg TID

## 2022-09-04 NOTE — Assessment & Plan Note (Signed)
A1c 6.8% one year ago, now 6.4 this admission. Taking 22 units of Tresiba BID at home. Glucose has been well-controlled here.  - Repeat A1c - CBGs, no insulin for now, add back as needed

## 2022-09-04 NOTE — Progress Notes (Addendum)
Daily Progress Note Intern Pager: [phone]  Patient name: ISADOR FACEMIRE Medical record number: [id number] Date of birth: 1960-08-29 Age: 62 y.o. Gender: male  Primary Care Provider: Lockie Mola, MD Consultants: Psychiatry Code Status: Full   Pt Overview and Major Events to Date:  8/4- admitted   Assessment and Plan: Mr. Kentrez Ciaramella is a 62 yo male presenting with altered mentation and question of seizure-like activity prior to arrival.  He was brought in by police after being found acting erratically in the community.  He also sat down on the ground and had a shaking episode after which he was paranoid and required Versed in the field.. Pertinent PMH/PSH includes diabetes, hypertension, CHF, prior MI. -      Hospital     * (Principal) AMS (altered mental status)     Alert and oriented this morning, but paranoid and tangential with  pressured speech. Admits to recent drug use but unclear on timeline  (synthetic cannabinoids "003 something like that" which he believes may  have been "dosed" with crack cocaine or "ice"). Though he does have fairly  significant weakness on his right side; MRI negative for acute pathology.  Differential remains broad and includes drug-induced bipolar  disorder/psychosis (more likely with UDS positive for everything but  opiates), primary psychotic disorder, infection (RPR reactive, awaiting  trep ab), seizure (attempted EEG though patient not wanting; will hold off  for now). - Psych consult for paranoia and tangential, pressured speech; appreciate  recommendations - Neurology signed off, no recs for AED or further workup - Continue seroquel though can dc if neurology starts depakote - F/u treponemal ab - Seizure precautions - PRN Versed for seizures        Controlled diabetes mellitus type 2 with complications (HCC)     A1c 6.8% one year ago, now 6.4 this admission. Taking 22 units of  Tresiba BID at home. Glucose has been  well-controlled here.  - Repeat A1c - CBGs, no insulin for now, add back as needed         History of MI (myocardial infarction)     Documented in chart, unable to find more detail.  - On Statin (held in setting of elevated CK for now) - Home B blocker, lopressor 25mg  BID - Not on ASA, if his mental status clears, will try to gather more  information         Rhabdomyolysis     In the setting of drug use as above. Low concern for pigment  nephropathy based on UA. CK downtrending. - Continue fluids - Holding home statin        Electrolyte abnormality     K 2.9 on admission, now 3.4. Mg 1.6 this AM. Likely in setting of  decreased PO. - Replete as needed        AKI (acute kidney injury) (HCC)     Cr. 3.23 on admission, now 1.06. Has had LR running at 130mL/hr. FeNa  0.1% suggesting pre-renal etiology. UA without Hgb/bili, lower concern for  pigment nephropathy. - Dc fluids given good PO with hx of CHF        Chronic pain     Uncontrolled back and R hip pain. XR of both in May 2024 with  degenerative changes. Reassured by activity and mobilization on exam. Has  been off home gabapentin which appeared to help in the past. - Sarna lotion to affected areas for now, can consider voltaren gel for  pain though  has allergy to naproxen - Consider restarting low dose gabapentin, can discuss with psychiatry - Consider hip joint injection in hospital though acute mania is  contraindication        Irritation of skin of perianal region     Irritation evident on exam. Possibly secondary to inadequate hygiene. - Desitin ointment with each bowel movement - Sarna lotion as needed - Ensure adequately cleaned after bowel movements     FEN/GI: Regular PPx: Lovenox Dispo: Pending further workup, likely inpatient psych  Subjective:  Doing the "best he ever will" this morning. Wants to let the team know this. He has been having hip pain and buttocks pain which he points to soon after  entering the room.  Objective: Temp:  [97.6 F (36.4 C)-98.3 F (36.8 C)] 98.3 F (36.8 C) (08/05 0637) Pulse Rate:  [91-108] 108 (08/05 0637) Resp:  [20-22] 21 (08/05 0637) BP: (90-128)/(58-97) 128/76 (08/05 0637) SpO2:  [91 %-98 %] 98 % (08/05 8469) Physical Exam: General: Sitting up in bed, NAD Cardiovascular: Tachycardic with regular rhythm, no m/r/g appreciated Respiratory: CTAB anteriorly, breathing and speaking well on RA Abdomen: Nondistended Genitourinary: Mild erythema overlying gluteal folds/perianal region with pain to patient's mobilization of tissue Neurological: Mobilizing well without assistance up and out of bed, moving all extremities without difficulty Psychiatric: Pressured and tangential speech, labile mood and affect, lack of inhibition  Laboratory: Most recent CBC Lab Results  Component Value Date   WBC 20.3 (H) 09/03/2022   HGB 12.0 (L) 09/03/2022   HCT 36.5 (L) 09/03/2022   MCV 91.3 09/03/2022   PLT 250 09/03/2022   Most recent BMP    Latest Ref Rng & Units 09/04/2022    1:58 AM  BMP  Glucose 70 - 99 mg/dL 629   BUN 8 - 23 mg/dL 21   Creatinine 5.28 - 1.24 mg/dL 4.13   Sodium 244 - 010 mmol/L 137   Potassium 3.5 - 5.1 mmol/L 3.4   Chloride 98 - 111 mmol/L 106   CO2 22 - 32 mmol/L 22   Calcium 8.9 - 10.3 mg/dL 8.3    Mg 1.6  Imaging/Diagnostic Tests: MRI brain w/wo contrast IMPRESSION: Normal examination. No abnormality seen to explain the clinical presentation.  Evette Georges, MD 09/04/2022, 2:11 PM  PGY-2, [facility] FPTS Intern pager: [phone], text pages welcome Secure chat group [facility] Teaching Service

## 2022-09-04 NOTE — Assessment & Plan Note (Signed)
>>  ASSESSMENT AND PLAN FOR AMS (ALTERED MENTAL STATUS) WRITTEN ON 09/04/2022  8:18 AM BY MABE, GERALD, MD  Alert and oriented this morning, but paranoid and tangential with pressured speech. Admits to recent drug use but unclear on timeline (synthetic cannabinoids "003 something like that" which he believes may have been "dosed" with crack cocaine or "ice"). Though he does have fairly significant weakness on his right side; MRI negative for acute pathology. Differential remains broad and includes drug-induced bipolar disorder/psychosis (more likely with UDS positive for everything but opiates), primary psychotic disorder, infection (RPR reactive, awaiting trep ab), seizure. - Psych consult for paranoia and tangential, pressured speech; appreciate recommendations - Neurology signed off, no recs for AED or further workup - Continue seroquel though can dc if neurology starts depakote - F/u EEG today - F/u treponemal ab - Seizure precautions - PRN Versed for seizures

## 2022-09-04 NOTE — Progress Notes (Signed)
Patient still not ready for tech to perform EEG. Asking to hold off until after lunch.
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
## 2022-09-04 NOTE — Progress Notes (Signed)
Patient refused bladder scan 

## 2022-09-04 NOTE — Progress Notes (Signed)
Patient on bedside commode. Tech waiting until he is finished.

## 2022-09-04 NOTE — Progress Notes (Signed)
UDS + for cocaine and amphetamines. I feel this explains his presentation and if the event witnessed was seizure, would consider this a provoked seizure. No AEDs at this time, please call with further questions or concerns.   Ritta Slot, MD [facility] [phone]  If 7pm- 7am, please page neurology on call as listed in AMION.

## 2022-09-04 NOTE — Assessment & Plan Note (Signed)
>>  ASSESSMENT AND PLAN FOR AMS (ALTERED MENTAL STATUS) WRITTEN ON 09/09/2022  6:18 AM BY HINDEL, LEAH, MD  Less agitated this morning compared to prior documentation, feels that current psych medications are working well. Ongoing paranoia surrounding medications while inpatient and frustration with IVC status. Differential still includes drug-induced bipolar disorder/psychosis versus neurosyphilis. - Continue IVC due to concern for labile mental status and behavior - May transfer to IP psych when patient completes syphilis 2 week treatment course, but will interface with psych team later - Seizure precautions, PRN Versed for seizures - Syphilis treatment as per primary problem - Appreciate psych consult, psychiatric medications managed per psych: -- Continue depakote 500 mg BID for mania -- Continue 200 mg seroquel at bedtime, 25 mg BID PRN agitation  -- Continue buspirone 10 mg TID for anxiety -- Continue fluoxetine 20 mg daily for depression -- Continue hydroxyzine 25 mg TID PRN for anxiety -- Continue Backup PRN geodon 2.5 mg PRN if pt refuses PO

## 2022-09-04 NOTE — Progress Notes (Signed)
Patient breakfast arrived after clean up. EEG to be done within the next hour once he is done.

## 2022-09-04 NOTE — Assessment & Plan Note (Addendum)
K 2.9 on admission, now 3.4. Mg 1.6 this AM. Likely in setting of decreased PO. - Replete as needed

## 2022-09-04 NOTE — Assessment & Plan Note (Addendum)
Alert and oriented this morning, but paranoid and tangential with pressured speech. Admits to recent drug use but unclear on timeline (synthetic cannabinoids "003 something like that" which he believes may have been "dosed" with crack cocaine or "ice"). Though he does have fairly significant weakness on his right side; MRI negative for acute pathology. Differential remains broad and includes drug-induced bipolar disorder/psychosis (more likely with UDS positive for everything but opiates), primary psychotic disorder, infection (RPR reactive, awaiting trep ab), seizure. - Psych consult for paranoia and tangential, pressured speech; appreciate recommendations - Neurology signed off, no recs for AED or further workup - Continue seroquel though can dc if neurology starts depakote - F/u EEG today - F/u treponemal ab - Seizure precautions - PRN Versed for seizures

## 2022-09-04 NOTE — Assessment & Plan Note (Signed)
Documented in chart, unable to find more detail.  - On Statin (held in setting of elevated CK for now) - Home B blocker, lopressor 25mg  BID - Not on ASA, if his mental status clears, will try to gather more information
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
## 2022-09-04 NOTE — Assessment & Plan Note (Signed)
Uncontrolled back and R hip pain. XR of both in May 2024 with degenerative changes. Reassured by activity and mobilization on exam. Has been off home gabapentin which appeared to help in the past. - Voltaren gel to back and hip - Consider restarting low dose gabapentin - Consider hip joint injection in hospital

## 2022-09-04 NOTE — Telephone Encounter (Signed)
Pt is in the hospital, room 1215E. He wants Dr Raymondo Band to call him. He is not getting his meds and is very upset. Pt said Dr Raymondo Band is his hero and knows him better than anyone.  Sunday Spillers, CMA

## 2022-09-04 NOTE — Progress Notes (Signed)
Patient refused 1200 vital signs.

## 2022-09-04 NOTE — Progress Notes (Deleted)
EEG complete - results pending 

## 2022-09-04 NOTE — Assessment & Plan Note (Addendum)
Cr. 3.23 on admission, now 1.06. Has had LR running at 113mL/hr. FeNa 0.1% suggesting pre-renal etiology. UA without Hgb/bili, lower concern for pigment nephropathy. - Dc fluids given good PO with hx of CHF

## 2022-09-05 DIAGNOSIS — R4182 Altered mental status, unspecified: Secondary | ICD-10-CM | POA: Diagnosis not present

## 2022-09-05 DIAGNOSIS — A530 Latent syphilis, unspecified as early or late: Secondary | ICD-10-CM | POA: Diagnosis present

## 2022-09-05 DIAGNOSIS — A539 Syphilis, unspecified: Secondary | ICD-10-CM | POA: Diagnosis present

## 2022-09-05 DIAGNOSIS — A523 Neurosyphilis, unspecified: Secondary | ICD-10-CM | POA: Diagnosis present

## 2022-09-05 DIAGNOSIS — F1994 Other psychoactive substance use, unspecified with psychoactive substance-induced mood disorder: Secondary | ICD-10-CM

## 2022-09-05 DIAGNOSIS — F19929 Other psychoactive substance use, unspecified with intoxication, unspecified: Secondary | ICD-10-CM | POA: Diagnosis not present

## 2022-09-05 MED ORDER — GABAPENTIN 300 MG PO CAPS
600.0000 mg | ORAL_CAPSULE | Freq: Three times a day (TID) | ORAL | Status: DC
  Administered 2022-09-05 – 2022-09-22 (×51): 600 mg via ORAL
  Filled 2022-09-05 (×51): qty 2

## 2022-09-05 MED ORDER — FLUOXETINE HCL 20 MG PO CAPS
20.0000 mg | ORAL_CAPSULE | Freq: Every day | ORAL | Status: DC
  Administered 2022-09-06 – 2022-09-14 (×9): 20 mg via ORAL
  Filled 2022-09-05 (×9): qty 1

## 2022-09-05 MED ORDER — ACETAMINOPHEN 500 MG PO TABS
1000.0000 mg | ORAL_TABLET | Freq: Four times a day (QID) | ORAL | Status: DC | PRN
  Administered 2022-09-06 – 2022-09-14 (×12): 1000 mg via ORAL
  Filled 2022-09-05 (×14): qty 2

## 2022-09-05 MED ORDER — ACETAMINOPHEN 650 MG RE SUPP: 650.0000 mg | Freq: Four times a day (QID) | RECTAL | Status: DC | PRN

## 2022-09-05 MED ORDER — ENOXAPARIN SODIUM 60 MG/0.6ML IJ SOSY
50.0000 mg | PREFILLED_SYRINGE | Freq: Every day | INTRAMUSCULAR | Status: DC
  Administered 2022-09-07 – 2022-09-22 (×16): 50 mg via SUBCUTANEOUS
  Filled 2022-09-05 (×16): qty 0.6

## 2022-09-05 MED ORDER — BUSPIRONE HCL 5 MG PO TABS
10.0000 mg | ORAL_TABLET | Freq: Three times a day (TID) | ORAL | Status: DC
  Administered 2022-09-05 – 2022-09-22 (×52): 10 mg via ORAL
  Filled 2022-09-05 (×52): qty 2

## 2022-09-05 NOTE — Assessment & Plan Note (Addendum)
Cr. 3.23 on admission, now stable at 0.73.  FeNa 0.1% suggested pre-renal etiology on admission. UA without Hgb/bili, lower concern for pigment nephropathy. - Off fluids since 8/5

## 2022-09-05 NOTE — Consult Note (Signed)
[facility]                                                                                        Patient Identification: Patient Name: Cody Nolan MRN: [id number] Admit Date: 09/02/2022  7:57 PM Today's Date: 09/05/2022 Reason for consult: concerns for neurosyphilis Requesting provider: Dr Deirdre Priest  Principal Problem:   Substance or medication-induced bipolar and related disorder with onset during intoxication [facility]) Active Problems:   Controlled diabetes mellitus type 2 with complications (HCC)   History of MI (myocardial infarction)   AMS (altered mental status)   Rhabdomyolysis   Electrolyte abnormality   AKI (acute kidney injury) (HCC)   Chronic pain   Irritation of skin of perianal region   Syphilis   Antibiotics:  None  Lines/Hardware:  Assessment 62 Y O male with h/o HTN, DM, CHF, MI, Depression, OSA who presented to ED on 8/3 with possible seizures and ams. He was brought by police after being found acting erratically in the community.   # AMS: Presumed substance induced psychosis. UDS 8/4 positive for amphetamines, BZDs, cocaine and THC. Evaluated by Neurology. CT Head and MRI brain unremarkable for acute abnormality. EEG wnl.  Seen by Psychiatry, note pending  8/4 RPR 1: 16, T pallidum abs positive. Patient denies prior h/o of syphilis or ever being treated for it. No prior syphilis h/o or tx per Health department  [phone] new vision or hearing changes. He reports using glasses for reading   Recommendations  Cannot r/o possibility of neurosyphilis given ams as well as positive syphilis serology with no report of prior tx history. Needs LP. Please send cell count w diff, glucose, protein, CSF VDRL, CSF-FTA abs.  If unable to get LP due to any reasons, will need to be presumptively tx for neurosyphilis with Penicillin 18-24 million units by continuous infusion for 14  days.   Check HIV, HBV and HCV serology as well as Urine GC  Recommendations has been d/w primary team  ID available as needed, please call with questions   Rest of the management as per the primary team. Please call with questions or concerns.  Thank you for the consult  __________________________________________________________________________________________________________ HPI and Hospital Course: history mostly from chart as patient is  62 Y O male with h/o HTN, DM, CHF, MI, Depression, OSA who presented to ED on 8/3 with possible seizures and ams. He was brought by police after being found acting erratically in the community. When EMS arrived patient was given 5 of Versed due to concern for seizure and agitation so they could get him on the stretcher into the hospital. Initial concerns with possible drug toxicity, alcohol withdrawal, psychosis. He had persistent agitation in the ED and required Geodon.   At ED afebrile  Labs are remarkable for k 2.9, AKI with Cr 3.23, CK elevated to 1162, WBC 22.5 Imaging as below   He is unaware of prior diagnosis of syphilis or ever being treated for it. He reports he has been faithful with both of his wives with whom he was married. He reports sleeping with few partners after his 1st  wife expired and before getting married to second wife. Denies sny prior h/o STDS. Quit smoking and alcohol.   ROS: limited in the setting of ams, pressured and tangential speech  Past Medical History:  Diagnosis Date   CHF (congestive heart failure) (HCC)    Depression    Diabetes mellitus    Hypertension    Myocardial infarction (HCC)    Sleep apnea    Sleep apnea    Tachycardia    Past Surgical History:  Procedure Laterality Date   CHOLECYSTECTOMY     knee  3 knee surgeries    Scheduled Meds:  busPIRone  10 mg Oral TID   enoxaparin (LOVENOX) injection  50 mg Subcutaneous Daily   [START ON 09/06/2022] FLUoxetine  20 mg Oral Daily   fluticasone  2 spray  Each Nare Daily   gabapentin  100 mg Oral TID   Gerhardt's butt cream   Topical BID   metoprolol tartrate  25 mg Oral BID   pantoprazole  40 mg Oral Daily   QUEtiapine  150 mg Oral QHS   tamsulosin  0.4 mg Oral Daily   Continuous Infusions: PRN Meds:.acetaminophen **OR** acetaminophen, camphor-menthol, hydrOXYzine, midazolam, QUEtiapine **OR** OLANZapine  Allergies  Allergen Reactions   Aleve [Naproxen Sodium] Anaphylaxis   Lisinopril Other (See Comments)    COUGH    Metformin And Related Diarrhea   Social History   Socioeconomic History   Marital status: Widowed    Spouse name: Not on file   Number of children: Not on file   Years of education: Not on file   Highest education level: Not on file  Occupational History   Not on file  Tobacco Use   Smoking status: Former    Current packs/day: 0.00    Average packs/day: 0.8 packs/day for 10.0 years (7.5 ttl pk-yrs)    Types: Cigarettes    Start date: 07/01/2010    Quit date: 06/30/2020    Years since quitting: 2.1   Smokeless tobacco: Never   Tobacco comments:    Quit - "[place]" in Spring 2022  Substance and Sexual Activity   Alcohol use: Yes    Comment: 2 cans of beer a month   Drug use: No   Sexual activity: Not Currently    Partners: Female  Other Topics Concern   Not on file  Social History Narrative   Not on file   Social Determinants of Health   Financial Resource Strain: Not on file  Food Insecurity: Not on file  Transportation Needs: Not on file  Physical Activity: Not on file  Stress: Not on file  Social Connections: Not on file  Intimate Partner Violence: Not on file   Family History  Problem Relation Age of Onset   Heart disease Mother    Hypertension Other     Vitals BP 138/74 (BP Location: Left Arm)   Pulse (!) 109   Temp 98.7 F (37.1 C) (Oral)   Resp 18   Ht 5\' 8"  (1.727 m)   Wt 113.4 kg   SpO2 100%   BMI 38.01 kg/m    Physical Exam Constitutional:  adult male lying in the  bed and appears comfortable and sitting with a sitter    Comments:   Cardiovascular:     Rate and Rhythm: Normal rate and regular rhythm.     Heart sounds: s1 and s2   Pulmonary:     Effort: Pulmonary effort is normal.     Comments: Normal breath  sounds   Abdominal:     Palpations: Abdomen is soft.     Tenderness: non distended and non tender   Musculoskeletal:        General: No swelling or tenderness in peripheral joints   Skin:    Comments: no rashes   Neurological:     General: awake, alert, oriented and following commands. Has pressurized speech, tangential speech and gets frustrated when interrupted. Grossly non focal   Psychiatric:        Mood and Affect: Mood normal.    Pertinent Microbiology Results for orders placed or performed during the hospital encounter of 07/05/12  MRSA PCR Screening     Status: Abnormal   Collection Time: 07/05/12  1:00 PM   Specimen: Nasopharyngeal  Result Value Ref Range Status   MRSA by PCR POSITIVE (A) NEGATIVE Final    Comment:        The GeneXpert MRSA Assay (FDA approved for NASAL specimens only), is one component of a comprehensive MRSA colonization surveillance program. It is not intended to diagnose MRSA infection nor to guide or monitor treatment for MRSA infections. RESULT CALLED TO, READ BACK BY AND VERIFIED WITH: [id number]     Pertinent Lab seen by me:    Latest Ref Rng & Units 09/03/2022    3:10 AM 09/02/2022   10:01 PM 06/07/2020    2:39 PM  CBC  WBC 4.0 - 10.5 K/uL 20.3  22.5  12.7   Hemoglobin 13.0 - 17.0 g/dL 86.5  78.4  69.6   Hematocrit 39.0 - 52.0 % 36.5  43.3  40.7   Platelets 150 - 400 K/uL 250  290  274       Latest Ref Rng & Units 09/05/2022    8:42 AM 09/04/2022    1:58 AM 09/03/2022    3:14 PM  CMP  Glucose 70 - 99 mg/dL 295  284  132   BUN 8 - 23 mg/dL 10  21  23    Creatinine 0.61 - 1.24 mg/dL 4.40  1.02  7.25   Sodium 135 - 145 mmol/L 139  137  139   Potassium 3.5 - 5.1 mmol/L  3.6  3.4  3.2   Chloride 98 - 111 mmol/L 104  106  102   CO2 22 - 32 mmol/L 23  22  25    Calcium 8.9 - 10.3 mg/dL 8.9  8.3  8.9     Pertinent Imagings/Other Imagings Plain films and CT images have been personally visualized and interpreted; radiology reports have been reviewed. Decision making incorporated into the Impression / Recommendations.  EEG adult  Result Date: 09/05/2022 Windell Norfolk, MD     09/05/2022  8:22 AM Patient Name: Cody Nolan MRN: [id number] Epilepsy Attending: Windell Norfolk Referring Physician/Provider: Dr. Laroy Apple Date: 09/04/2022 Duration: 23 min Patient history: Level of alertness: Awake AEDs during EEG study: Technical aspects: This EEG study was done with scalp electrodes positioned according to the 10-20 International system of electrode placement. Electrical activity was reviewed with band pass filter of 1-70Hz , sensitivity of 7 uV/mm, display speed of 70mm/sec with a 60Hz  notched filter applied as appropriate. EEG data were recorded continuously and digitally stored.  Video monitoring was available and reviewed as appropriate. Description: The posterior dominant rhythm consists of 9-10 Hz activity of low voltage seen predominantly in posterior head regions, symmetric and reactive to eye opening and eye closing. Drowsiness was characterized by attenuation of the posterior background rhythm.  There is an excessive amount of  15 to 18 Hz, 2-3 uV beta activity with irregular morphology distributed symmetrically and diffusely. Hyperventilation and photic stimulation were not performed.   ABNORMALITY - Excessive beta, generalized IMPRESSION: This study is within normal limits. The excessive beta activity seen in the background is most likely due to medication side effect such as benzodiazepine and is a benign EEG pattern. A normal interictal EEG does not exclude nor support the diagnosis of epilepsy. Windell Norfolk   MR BRAIN W WO CONTRAST  Result Date: 09/03/2022 CLINICAL DATA:   Seizure, new onset.  Left-sided weakness. EXAM: MRI HEAD WITHOUT AND WITH CONTRAST TECHNIQUE: Multiplanar, multiecho pulse sequences of the brain and surrounding structures were obtained without and with intravenous contrast. CONTRAST:  9.63mL GADAVIST GADOBUTROL 1 MMOL/ML IV SOLN COMPARISON:  Head CT yesterday FINDINGS: Brain: Diffusion imaging does not show any acute or subacute infarction or other cause of restricted diffusion. No focal abnormality affects the brainstem or cerebellum. Cerebral hemispheres do not show any small or large vessel infarction. No mass lesion, hemorrhage, hydrocephalus or extra-axial collection. Mesial temporal lobes appear normal and symmetric. Vascular: Major vessels at the base of the brain show flow. Skull and upper cervical spine: Negative Sinuses/Orbits: Clear/normal Other: None IMPRESSION: Normal examination. No abnormality seen to explain the clinical presentation. Electronically Signed   By: Paulina Fusi M.D.   On: 09/03/2022 17:09   DG Abd 1 View  Result Date: 09/03/2022 CLINICAL DATA:  Preprocedural exam. EXAM: ABDOMEN - 1 VIEW COMPARISON:  03/18/2012 FINDINGS: Gas is identified throughout the stomach, small bowel and colon up to the level of the rectum. No pathologic dilatation of the bowel loops identified. IMPRESSION: Nonobstructive bowel gas pattern. Electronically Signed   By: Signa Kell M.D.   On: 09/03/2022 13:06   CT Head Wo Contrast  Result Date: 09/03/2022 CLINICAL DATA:  Altered mental status, seizure EXAM: CT HEAD WITHOUT CONTRAST TECHNIQUE: Contiguous axial images were obtained from the base of the skull through the vertex without intravenous contrast. RADIATION DOSE REDUCTION: This exam was performed according to the departmental dose-optimization program which includes automated exposure control, adjustment of the mA and/or kV according to patient size and/or use of iterative reconstruction technique. COMPARISON:  10/11/2008 FINDINGS: Brain: Normal  anatomic configuration. No abnormal intra or extra-axial mass lesion or fluid collection. No abnormal mass effect or midline shift. No evidence of acute intracranial hemorrhage or infarct. Ventricular size is normal. Cerebellum unremarkable. Vascular: Unremarkable Skull: Intact Sinuses/Orbits: Small mucous retention cyst partially visualized within the left maxillary sinus. Paranasal sinuses are otherwise clear. Orbits are unremarkable. Other: Mastoid air cells and middle ear cavities are clear. IMPRESSION: 1. No acute intracranial abnormality. No calvarial fracture. Electronically Signed   By: Helyn Numbers M.D.   On: 09/03/2022 00:15   DG Chest Port 1 View  Result Date: 09/02/2022 CLINICAL DATA:  Seizure EXAM: PORTABLE CHEST 1 VIEW COMPARISON:  None Available. FINDINGS: Lungs volumes are small, but are symmetric and are clear. No pneumothorax or pleural effusion. Cardiac size within normal limits. Pulmonary vascularity is normal. Osseous structures are age-appropriate. No acute bone abnormality. IMPRESSION: 1. Pulmonary hypoinflation. Electronically Signed   By: Helyn Numbers M.D.   On: 09/02/2022 23:34     I have personally spent 88 minutes involved in face-to-face and non-face-to-face activities for this patient on the day of the visit. Professional time spent includes the following activities: Preparing to see the patient (review of tests), Obtaining and/or reviewing separately obtained history (admission/discharge record), Performing a medically appropriate  examination and/or evaluation , Ordering medications/tests/procedures, referring and communicating with other health care professionals, Documenting clinical information in the EMR, Independently interpreting results (not separately reported), Communicating results to the patient/family/caregiver, Counseling and educating the patient/family/caregiver and Care coordination (not separately reported).  Electronically signed by:   Plan d/w requesting  provider as well as ID pharm D  Note: This document was prepared using dragon voice recognition software and may include unintentional dictation errors.   Odette Fraction, MD Infectious Disease Physician [facility] Pager: [phone]

## 2022-09-05 NOTE — Assessment & Plan Note (Addendum)
K 2.9 on admission, now stable at 3.6. - Replete as needed

## 2022-09-05 NOTE — Progress Notes (Incomplete)
Patient going off on tangent. When the nurse gave meds earlier, the patient noticed there was one pill missing and the nurse found it on the computer. The patient took the medications but seems to focus on the fact the nurse had a medication separate from the rest and now doesn't seem to remember he took his medications.  Also, the patient is focusing on how the medications his physician prescribed (such as Tramadol and Benadryl)him are not ordered for him while he's here

## 2022-09-05 NOTE — Consult Note (Signed)
Cody Nolan Psychiatry Consult Evaluation  Service Date: September 05, 2022 LOS:  LOS: 2 days    Primary Psychiatric Diagnoses  Substance induced bipolar disorder   Assessment  Cody Nolan is a 62 y.o. male admitted medically for 09/02/2022  7:57 PM for ?seizure vs stroke. He carries the psychiatric diagnoses of depression from PCP and has a past medical history of  CHF, diabetes, HTN, MI, sleep apnea. Psychiatry was consulted for Paranoid and tangential, no documented history of psychosis  by Dr. Marisue Humble.  His current presentation of pressured, tangential speech, grandiosity, lack of sleep, is most consistent with a diagnosis of mania; given recent substance use and absence of formal bipolar diagnosis this is most likely substance induced mood disorder. He is currently amenable to both medic alt treatment (seizure, ?stroke) and psychiatric treatment after he is medically stable. Given his alleged bizarre behavior, threats to family, etc he meets criteria for inpt psychiatric hospitalization.  Current outpatient psychotropic medications include prozac and buspirone and historically he has had a unknown response to these medications. He was semi compliant with medications prior to admission as evidenced by fill hx. On initial examination, patient was difficult to engage (spoke continually, became agitated when interrupted) but displayed worse insight into his condition and status than yesterday.   Diagnoses:  Active Hospital problems: Principal Problem:   Substance induced mood disorder (HCC) Active Problems:   Controlled diabetes mellitus type 2 with complications (HCC)   AMS (altered mental status)   History of MI (myocardial infarction)   Rhabdomyolysis   AKI (acute kidney injury) (HCC)   Electrolyte abnormality   Chronic pain   Irritation of skin of perianal region   Syphilis     Plan   ## Psychiatric Medication Recommendations:  -- Start buspirone 10 mg TID for anxiety --  Decrease fluoxetine 40 mg to 20 mg daily for depression -- C quetiapine 150 mg at bedtime, 25 mg BID PRN agitation  -- C hydroxyzine 25 mg TID PRN for anxiety -- C Backup PRN geodon 2.5 mg PRN if pt refuses PO  - d/w FM - if neuro starts depakote may cancel this  ## Medical Decision Making Capacity:  -- functionally lacks at this time due to inability to engage in any sort of discussion   ## Further Work-up:  -- agree with thorough w/u from primary and neuro teams -- UDS showed positive for benzodiazepines (iatrogenic), amphetamines, cocaine, THC  -- most recent EKG on 8/3 had QtC of 437 in setting of sinus tach -- Pertinent labwork reviewed earlier this admission includes: elevated CK (1003 on 8/4), HgbA1c 6.4, Reactive RPR (titer 1:16), proteinuria, hypocalcemia  and hypokalemia.  ## Disposition:  -- We recommend inpatient psychiatric hospitalization after medical hospitalization. Patient has been involuntarily committed on 09/04/2022.   ## Behavioral / Environmental:  --  Utilize compassion and acknowledge the patient's experiences while setting clear and realistic expectations for care.  ## Safety and Observation Level:  - Based on my clinical evaluation, I estimate the patient to be at low risk of self harm in the current setting  - At this time, we recommend a 1:1 level of observation due to our concern of his acute mental status. He is a low-risk of intentional self harm, but he may harm himself in an agitated state.    Thank you for this consult request. Recommendations have been communicated to the primary team.  We will continue to follow at this time.   [name]  [name], MD  Psychiatric and Social History   Relevant Aspects of Hospital Course:  Admitted on 09/02/2022 for agitation and bizarre behavior after a witnessed seizure.   Patient Report:  8/6 Met patient this morning with MS3 Cody Nolan. Patient and notewriter repaired the therapeutic relationship  from yesterday's misunderstanding. Still extremely difficult to interrupt.   Pt continued making grandiose statements about his vast wealth, property holdings, businesses and his cars. Pt remained extremely distractible. He described his initial presentation as having wandered into the road with his hands up stating that he was in a mental health crisis and needed help.   Inquired about his scars on his shoulder. Pt says they were from a past altercation with police.    Throughout the course of the interview, he denied suicidal thoughts and homicidal thoughts. He was not responding to internal stimuli.  Psych ROS:  Positive for poor sleep, lability (goes from pleasant to argumentative without apparent reason), tangentiality, distractibility, grandiosity, flight of ideas, pressured speech.  No overt psychosis outside of Paranoia (believes that the cameras are there to spy on him and that the psychiatry team was "in on it."). Pt reported high anxiety, requested something to help for that, and begged for his buspirone.   Collateral information:  Pt offered the phone number of his attorney   Psychiatric History:  Information collected from pt, medical record  Prev Dx/Sx: ??? Depression and anxiety from prescriptions, potentially PTSD Current Psych Provider: PCP Home Meds (current): prozac, buspirone Previous Med Trials: unknown Therapy: unknown  Prior ECT: unknown Prior Psych Hospitalization: unknown  Prior Self Harm: denied  Family Psych History: unknown Family Hx suicide: unknown  Social History:   Living Situation: was living with Cody Nolan and her husband  Substance History  Drug use: Cocaine, Amphetamines, THC   Exam Findings   Psychiatric Specialty Exam:  Presentation  General Appearance: Disheveled  Eye Contact:Good  Speech:Pressured  Speech Volume:Increased  Handedness:Right   Mood and Affect  Mood:Labile; Euphoric; Dysphoric  Affect:Labile;  Tearful   Thought Process  Thought Processes:Linear  Descriptions of Associations:Tangential  Orientation:Full (Time, Place and Person)  Thought Content:Paranoid Ideation  Hallucinations:Hallucinations: None  Ideas of Reference:None  Suicidal Thoughts:Suicidal Thoughts: No  Homicidal Thoughts:Homicidal Thoughts: No   Sensorium  Memory:Immediate Fair; Recent Good; Remote Good  Judgment:Fair  Insight:Fair   Executive Functions  Concentration:Fair  Attention Span:Fair  Recall:Fair  Fund of Knowledge:Good  Language:Fair   Psychomotor Activity  Psychomotor Activity:Psychomotor Activity: Normal   Assets  Assets:Communication Skills; Desire for Improvement   Sleep  Sleep:Sleep: Fair    Physical Exam: Vital signs:  Temp:  [98.7 F (37.1 C)-99 F (37.2 C)] 98.7 F (37.1 C) (08/06 0937) Pulse Rate:  [97-109] 109 (08/06 0937) Resp:  [18-27] 18 (08/06 0937) BP: (138)/(74) 138/74 (08/06 0937) SpO2:  [97 %-100 %] 100 % (08/06 1610) Physical Exam Vitals and nursing note reviewed.  HENT:     Head: Normocephalic and atraumatic.  Eyes:     Conjunctiva/sclera: Conjunctivae normal.  Pulmonary:     Effort: Pulmonary effort is normal.  Neurological:     Mental Status: He is alert. He is disoriented.  Psychiatric:        Mood and Affect: Mood is anxious. Affect is labile and angry.        Speech: Speech is rapid and pressured.        Behavior: Behavior is uncooperative, agitated and aggressive.        Thought Content: Thought content  is paranoid and delusional.        Cognition and Memory: Cognition is impaired.        Judgment: Judgment is impulsive.     Blood pressure 138/74, pulse (!) 109, temperature 98.7 F (37.1 C), temperature source Oral, resp. rate 18, height 5\' 8"  (1.727 m), weight 113.4 kg, SpO2 100%. Body mass index is 38.01 kg/m.   Other History   These have been pulled in through the EMR, reviewed, and updated if appropriate.   Family  History:  The patient's family history includes Heart disease in his mother; Hypertension in an other family member.  Medical History: Past Medical History:  Diagnosis Date   CHF (congestive heart failure) (HCC)    Depression    Diabetes mellitus    Hypertension    Myocardial infarction (HCC)    Sleep apnea    Sleep apnea    Tachycardia     Surgical History: Past Surgical History:  Procedure Laterality Date   CHOLECYSTECTOMY     knee  3 knee surgeries    Medications:   Current Facility-Administered Medications:    acetaminophen (TYLENOL) tablet 650 mg, 650 mg, Oral, Q6H PRN, 650 mg at 09/05/22 1036 **OR** acetaminophen (TYLENOL) suppository 650 mg, 650 mg, Rectal, Q6H PRN, Lincoln Brigham, MD   busPIRone (BUSPAR) tablet 10 mg, 10 mg, Oral, TID, [name], MD, 10 mg at 09/05/22 1055   camphor-menthol (SARNA) lotion, , Topical, PRN, Shelby Mattocks, DO   enoxaparin (LOVENOX) injection 50 mg, 50 mg, Subcutaneous, Daily, Mabe, Gerald, MD, 50 mg at 09/05/22 1037   [START ON 09/06/2022] FLUoxetine (PROZAC) capsule 20 mg, 20 mg, Oral, Daily, Yoav Okane, Byrd Hesselbach, MD   fluticasone (FLONASE) 50 MCG/ACT nasal spray 2 spray, 2 spray, Each Nare, Daily, Darnelle Spangle B, MD, 2 spray at 09/05/22 1039   gabapentin (NEURONTIN) capsule 100 mg, 100 mg, Oral, TID, Evette Georges, MD, 100 mg at 09/05/22 1036   Gerhardt's butt cream, , Topical, BID, Chambliss, Estill Batten, MD, Given at 09/04/22 2212   hydrOXYzine (ATARAX) tablet 25 mg, 25 mg, Oral, TID PRN, [name], MD   metoprolol tartrate (LOPRESSOR) tablet 25 mg, 25 mg, Oral, BID, Darnelle Spangle B, MD, 25 mg at 09/05/22 1036   midazolam (VERSED) injection 2 mg, 2 mg, Intravenous, PRN, Alicia Amel, MD   QUEtiapine (SEROQUEL) tablet 25 mg, 25 mg, Oral, BID PRN, 25 mg at 09/05/22 1036 **OR** OLANZapine (ZYPREXA) injection 2.5 mg, 2.5 mg, Intramuscular, BID PRN, [name], MD    pantoprazole (PROTONIX) EC tablet 40 mg, 40 mg, Oral, Daily, Darnelle Spangle B, MD, 40 mg at 09/05/22 1036   QUEtiapine (SEROQUEL) tablet 150 mg, 150 mg, Oral, QHS, Jasilyn Holderman, Byrd Hesselbach, MD, 150 mg at 09/04/22 2207   tamsulosin (FLOMAX) capsule 0.4 mg, 0.4 mg, Oral, Daily, Darnelle Spangle B, MD, 0.4 mg at 09/05/22 1036  Allergies: Allergies  Allergen Reactions   Aleve [Naproxen Sodium] Anaphylaxis   Lisinopril Other (See Comments)    COUGH    Metformin And Related Diarrhea

## 2022-09-05 NOTE — Assessment & Plan Note (Signed)
Documented in chart, unable to find more detail.  - On Statin (held in setting of elevated CK for now) - Home B blocker, lopressor 25mg  BID - Not on ASA, states MI was a "long time ago"

## 2022-09-05 NOTE — Progress Notes (Addendum)
Daily Progress Note Intern Pager: [phone]  Patient name: Cody Nolan Medical record number: [id number] Date of birth: [birth year] Age: 62 y.o. Gender: male  Primary Care Provider: Lockie Mola, MD Consultants: Psychiatry, Infectious Disease Code Status: Full  Pt Overview and Major Events to Date:  8/4 - Admitted 8/6 - Treponemal antibodies reactive.  ID consulted, LP planned.   Assessment and Plan: Cody Nolan is a 62 y.o. male with a pertinent PMH of diabetes, HTN, CHF, and prior MI who presented with altered mentation and ?seizure-like activity when brought in by police from the community and was admitted for substance-induced psychosis and AKI, currently IVC.  [facility]     * (Principal) AMS (altered mental status)     Alert and oriented this morning, disorganized and tangential. Unclear  drug use history with UDS positive for all except opiates. 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 Differential also includes  drug-induced bipolar disorder/psychosis and seizure (attempted EEG though  patient not wanting; will hold off for now). - Appreciate psych consult, will transfer to IP psych when patient  completes syphilis treatment course (timeline pending LP) - Neurology signed off, no recs for AED or further workup - Continue seroquel - Seizure precautions - PRN Versed for seizures        Controlled diabetes mellitus type 2 with complications (HCC)     A1c 6.8% one year ago, now 6.4 this admission. Taking 22 units of  Tresiba BID at home. Glucose has been well-controlled here.  - CBGs, no insulin for now, add back as needed         History of MI (myocardial infarction)     Documented in chart, unable to find more detail.  - On Statin (held in setting of elevated CK for now) - Home B blocker, lopressor 25mg  BID - Not on ASA, if his mental status clears,  will try to gather more  information         Electrolyte abnormality     K 2.9 on admission, now 3.6. Mg 1.7 this AM, now recovering. - Replete as needed        Chronic pain     Does not complain of pain today. XR of both in May 2024 with  degenerative changes. Reassured by activity and mobilization on exam. Has  been off home gabapentin which appeared to help in the past. - Sarna lotion to affected areas for now, can consider voltaren gel for  pain though has allergy to naproxen - Increased gabapentin to home dose 600 mg TID - Consider hip joint injection in hospital if needed        Irritation of skin of perianal region     Irritation evident on exam. Possibly secondary to inadequate hygiene. - Desitin ointment with each bowel movement - Sarna lotion as needed - Ensure adequately cleaned after bowel movements        Substance or medication-induced bipolar and related disorder with onset  during intoxication (HCC)     Syphilis     RPR positive on admission, confirmed with treponemal antibody test.   Concern for neurosyphilis, cannot ascertain past syphilis infection  history. - LP to be performed, appreciate ID recs - Will need follow up with non-treponemal test in 6 months and 12 months      FEN/GI: Regular diet, PIV w/out fluids PPx: Lovenox Dispo:  Likely to inpatient psych  pending clinical improvement .  Subjective:  Patient was noted to be refusing labs in AM by RN, but was amenable to labs when evaluated later.  He continues to be disorganized and go off on tangents   Objective: Temp:  [98.7 F (37.1 C)-99 F (37.2 C)] 98.7 F (37.1 C) (08/06 0937) Pulse Rate:  [97-109] 109 (08/06 0937) Resp:  [18-27] 18 (08/06 0937) BP: (138)/(74) 138/74 (08/06 0937) SpO2:  [97 %-100 %] 100 % (08/06 0937)  Physical Exam: General: Age-appropriate, resting comfortably in chair, NAD, WNWD, alert and at baseline. HEENT: PERRLA. No oral lesions or ulcers. Reduced hearing  bilaterally. Cardiovascular: Tachycardic, regular rhythm. Normal S1/S2. No murmurs, rubs, or gallops appreciated. 2+ radial pulses. Pulmonary: Clear bilaterally to ascultation. No increased WOB, no accessory muscle usage on room air. No wheezes, rales, or crackles. Skin: Warm and dry. Slightly cracked skin and erythema of perianal region. No rashes grossly. Extremities: No peripheral edema bilaterally. Psych: Disinhibited, disorganized, tangential reasoning, pressured speech.  Laboratory: Most recent CBC Lab Results  Component Value Date   WBC 20.3 (H) 09/03/2022   HGB 12.0 (L) 09/03/2022   HCT 36.5 (L) 09/03/2022   MCV 91.3 09/03/2022   PLT 250 09/03/2022   Most recent BMP    Latest Ref Rng & Units 09/05/2022    8:42 AM  BMP  Glucose 70 - 99 mg/dL 119   BUN 8 - 23 mg/dL 10   Creatinine 1.47 - 1.24 mg/dL 8.29   Sodium 562 - 130 mmol/L 139   Potassium 3.5 - 5.1 mmol/L 3.6   Chloride 98 - 111 mmol/L 104   CO2 22 - 32 mmol/L 23   Calcium 8.9 - 10.3 mg/dL 8.9     Other pertinent labs: - None  New Imaging/Diagnostic Tests: - None  Dariane Natzke, MD 09/05/2022, 2:51 PM [facility]  FMTS Intern pager: [phone], text pages welcome Secure chat group [facility] Teaching Service

## 2022-09-05 NOTE — Assessment & Plan Note (Addendum)
RPR positive on admission, confirmed with treponemal antibody test.  Concern for neurosyphilis, cannot ascertain past syphilis infection history, unable to obtain LP consent with HPOA. - ID on board, appreciate recs - Empiric Pen G 24 MU continuous daily for 14 days (8/8-8/22) - Benzathine Pen G 2.4 MU x1 after completion of IV course  - Will need follow up with non-treponemal test in 6 months, 12 months, 24 months

## 2022-09-05 NOTE — Progress Notes (Signed)
All night, the patient has refused to have vital signs, blood sugars or labs taken. He states he does not want to be touch or poked or prodded.  Will continue to monitor.  Harriet Masson, RN

## 2022-09-05 NOTE — Assessment & Plan Note (Addendum)
A1c 6.8% one year ago, now 6.4 this admission. Taking 22 units of Tresiba BID at home. Patient refusing CBGs inpatient at this time. - CBGs Q4h, add insulin as appropriate

## 2022-09-05 NOTE — Assessment & Plan Note (Addendum)
In the setting of drug use as above. 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 CK downtrending. - Holding home statin - Fluids discontinued since yesterday, Cr stable

## 2022-09-05 NOTE — Procedures (Signed)
Patient Name: SCHNEIDER GAVINS  MRN: [id number]  Epilepsy Attending: Windell Norfolk  Referring Physician/Provider: Dr. Laroy Apple Date: 09/04/2022 Duration: 23 min  Patient history:   Level of alertness: Awake  AEDs during EEG study:   Technical aspects: This EEG study was done with scalp electrodes positioned according to the 10-20 International system of electrode placement. Electrical activity was reviewed with band pass filter of 1-70Hz , sensitivity of 7 uV/mm, display speed of 2mm/sec with a 60Hz  notched filter applied as appropriate. EEG data were recorded continuously and digitally stored.  Video monitoring was available and reviewed as appropriate.  Description: The posterior dominant rhythm consists of 9-10 Hz activity of low voltage seen predominantly in posterior head regions, symmetric and reactive to eye opening and eye closing. Drowsiness was characterized by attenuation of the posterior background rhythm.  There is an excessive amount of 15 to 18 Hz, 2-3 uV beta activity with irregular morphology distributed symmetrically and diffusely. Hyperventilation and photic stimulation were not performed.     ABNORMALITY - Excessive beta, generalized    IMPRESSION: This study is within normal limits. The excessive beta activity seen in the background is most likely due to medication side effect such as benzodiazepine and is a benign EEG pattern. A normal interictal EEG does not exclude nor support the diagnosis of epilepsy.   Senaida Chilcote

## 2022-09-05 NOTE — Assessment & Plan Note (Addendum)
RPR positive on admission, confirmed with treponemal antibody test.  Concern for neurosyphilis, cannot ascertain past syphilis infection history. - LP to be performed, appreciate ID recs - Will need follow up with non-treponemal test in 6 months and 12 months

## 2022-09-06 DIAGNOSIS — F1994 Other psychoactive substance use, unspecified with psychoactive substance-induced mood disorder: Secondary | ICD-10-CM | POA: Diagnosis not present

## 2022-09-06 DIAGNOSIS — F19929 Other psychoactive substance use, unspecified with intoxication, unspecified: Secondary | ICD-10-CM | POA: Diagnosis not present

## 2022-09-06 DIAGNOSIS — R4182 Altered mental status, unspecified: Secondary | ICD-10-CM | POA: Diagnosis not present

## 2022-09-06 LAB — CBC
HCT: 35.6 % — ABNORMAL LOW (ref 39.0–52.0)
Hemoglobin: 11.7 g/dL — ABNORMAL LOW (ref 13.0–17.0)
MCH: 29.4 pg (ref 26.0–34.0)
MCHC: 32.9 g/dL (ref 30.0–36.0)
MCV: 89.4 fL (ref 80.0–100.0)
Platelets: 314 10*3/uL (ref 150–400)
RBC: 3.98 MIL/uL — ABNORMAL LOW (ref 4.22–5.81)
RDW: 14.2 % (ref 11.5–15.5)
WBC: 12 10*3/uL — ABNORMAL HIGH (ref 4.0–10.5)
nRBC: 0 % (ref 0.0–0.2)

## 2022-09-06 MED ORDER — MELATONIN 5 MG PO TABS
5.0000 mg | ORAL_TABLET | Freq: Every evening | ORAL | Status: DC | PRN
  Administered 2022-09-07 – 2022-09-15 (×6): 5 mg via ORAL
  Filled 2022-09-06 (×6): qty 1

## 2022-09-06 MED ORDER — MIDAZOLAM HCL 2 MG/2ML IJ SOLN: 2.0000 mg | Freq: Once | INTRAMUSCULAR | Status: DC

## 2022-09-06 MED ORDER — PENICILLIN G POTASSIUM 20000000 UNITS IJ SOLR
12.0000 10*6.[IU] | Freq: Two times a day (BID) | INTRAVENOUS | Status: AC
  Administered 2022-09-06 – 2022-09-20 (×28): 12 10*6.[IU] via INTRAVENOUS
  Filled 2022-09-06 (×31): qty 12

## 2022-09-06 MED ORDER — DIVALPROEX SODIUM 500 MG PO DR TAB
500.0000 mg | DELAYED_RELEASE_TABLET | Freq: Two times a day (BID) | ORAL | Status: DC
  Administered 2022-09-06 – 2022-09-10 (×9): 500 mg via ORAL
  Filled 2022-09-06 (×9): qty 1

## 2022-09-06 MED ORDER — QUETIAPINE FUMARATE 100 MG PO TABS
200.0000 mg | ORAL_TABLET | Freq: Every day | ORAL | Status: DC
  Administered 2022-09-06 – 2022-09-07 (×2): 200 mg via ORAL
  Filled 2022-09-06 (×2): qty 2

## 2022-09-06 NOTE — Consult Note (Signed)
Cody Nolan Psychiatry Consult Evaluation  Service Date: September 06, 2022 LOS:  LOS: 3 days    Primary Psychiatric Diagnoses  Substance induced bipolar disorder   Assessment  Cody Nolan is a 62 y.o. male admitted medically for 09/02/2022  7:57 PM for ?seizure vs stroke. He carries the psychiatric diagnoses of depression from PCP and has a past medical history of  CHF, diabetes, HTN, MI, sleep apnea. Psychiatry was consulted for Paranoid and tangential, no documented history of psychosis  by Dr. Marisue Humble.  His current presentation of pressured, tangential speech, grandiosity, lack of sleep, is most consistent with a diagnosis of mania; given recent substance use and absence of formal bipolar diagnosis this is most likely substance induced mood disorder. 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 He is currently amenable to both medic alt treatment (seizure, ?stroke) and psychiatric treatment after he is medically stable. Given his alleged bizarre behavior, threats to family, etc he meets criteria for inpt psychiatric hospitalization. Pt currently denies any cooperation with regards to inpatient psychiatric hospitalization. Current outpatient psychotropic medications include prozac and buspirone and historically he has had a unknown response to these medications. He was semi compliant with medications prior to admission as evidenced by fill hx. On initial examination, patient was difficult to engage (spoke continually, became agitated when interrupted) but displayed worse insight into his condition and status than yesterday.   Diagnoses:  Active Hospital problems: Principal Problem:   AMS (altered mental status) Active Problems:   Controlled diabetes mellitus type 2 with complications (HCC)   Substance or medication-induced bipolar  and related disorder with onset during intoxication (HCC)   History of MI (myocardial infarction)   Chronic pain   Irritation of skin of perianal region   Syphilis   Positive RPR test     Plan   ## Psychiatric Medication Recommendations:  -- Start depakote 500 mg BID for mania -- Increase quetiapine 150 mg to 200 mg at bedtime, 25 mg BID PRN agitation  -- Continue buspirone 10 mg TID for anxiety -- Continue fluoxetine 40 mg to 20 mg daily for depression -- C hydroxyzine 25 mg TID PRN for anxiety -- C Backup PRN geodon 2.5 mg PRN if pt refuses PO  - d/w FM - if neuro starts depakote may cancel this  ## Medical Decision Making Capacity:  -- functionally lacks at this time due to inability to engage in any sort of discussion   ## Further Work-up:  -- agree with thorough w/u from primary and neuro teams -- UDS showed positive for benzodiazepines (iatrogenic), amphetamines, cocaine, THC  -- most recent EKG on 8/3 had QtC of 437 in setting of sinus tach -- Pertinent labwork reviewed earlier this admission includes: elevated CK (1003 on 8/4), HgbA1c 6.4, Reactive RPR (titer 1:16), proteinuria, hypocalcemia  and hypokalemia.  ## Disposition:  -- We recommend inpatient psychiatric hospitalization after medical hospitalization. Patient has been involuntarily committed on 09/04/2022.   ## Behavioral / Environmental:  --  Utilize compassion and acknowledge the patient's experiences while setting clear and realistic expectations for care.  ## Safety and Observation Level:  - Based on my clinical evaluation, I estimate the patient to be at low risk of self harm in the current setting  - At this time, we recommend a 1:1 level of observation due to our concern  of his acute mental status. He is a low-risk of intentional self harm, but he may harm himself in an agitated state.    Thank you for this consult request. Recommendations have been communicated to the primary team.  We will continue to  follow at this time.   Cody Dys, MD  Psychiatric and Social History   Relevant Aspects of Hospital Course:  Admitted on 09/02/2022 for agitation and bizarre behavior after a witnessed seizure.   Patient Report:  8/7 Met patient this morning at bedside. Patient remains acutely manic.   Pt continued making grandiose statements about his cars, his businesses, his contacts, and his influence. Pt was less distractible. He had substantial paranoia that he was not being told the entire truth about his diagnoses and his medications. Reviewed all orders for medications along with his dispense history and patient disputed that he had received his medications. He remains paranoid that medications are being missed or switched.   Throughout the course of the interview, he denied suicidal thoughts and homicidal thoughts. He was not responding to internal stimuli. Patient still has concern about retrieving his [place] cour[place]?  Psych ROS:  Positive for poor sleep, lability (goes from pleasant to argumentative without apparent reason), tangentiality, distractibility, grandiosity, flight of ideas, pressured speech.  No overt psychosis outside of Paranoia (believes that the cameras are there to spy on him and that the psychiatry team was "in on it."). Pt reported high anxiety, requested something to help for that, and begged for his buspirone.   Collateral information:  Pt offered the phone number of his attorney   Psychiatric History:  Information collected from pt, medical record  Prev Dx/Sx: ??? Depression and anxiety from prescriptions, potentially PTSD Current Psych Provider: PCP Home Meds (current): prozac, buspirone Previous Med Trials: unknown Therapy: unknown  Prior ECT: unknown Prior Psych Hospitalization: unknown  Prior Self Harm: denied  Family Psych History: unknown Family Hx suicide: unknown  Social History:   Living  Situation: was living with Cody Nolan and her husband  Substance History  Drug use: Cocaine, Amphetamines, THC   Exam Findings   Psychiatric Specialty Exam:  Presentation  General Appearance: Casual; Disheveled  Eye Contact:Good  Speech:Pressured  Speech Volume:Increased  Handedness:Right   Mood and Affect  Mood:Euphoric; Dysphoric; Labile  Affect:Labile   Thought Process  Thought Processes:Linear  Descriptions of Associations:Tangential  Orientation:Full (Time, Place and Person)  Thought Content:Delusions; Paranoid Ideation; Tangential  Hallucinations:Hallucinations: None  Ideas of Reference:None  Suicidal Thoughts:Suicidal Thoughts: No  Homicidal Thoughts:Homicidal Thoughts: No   Sensorium  Memory:Immediate Fair; Recent Good; Remote Good  Judgment:Fair  Insight:Poor   Executive Functions  Concentration:Fair  Attention Span:Fair  Recall:Fair  Fund of Knowledge:Good  Language:Good   Psychomotor Activity  Psychomotor Activity:Psychomotor Activity: Normal   Assets  Assets:Communication Skills; Desire for Improvement   Sleep  Sleep:Sleep: Poor    Physical Exam: Vital signs:  Temp:  [97.6 F (36.4 C)-98.9 F (37.2 C)] 98.4 F (36.9 C) (08/07 1100) Pulse Rate:  [92-120] 103 (08/07 1100) Resp:  [14-20] 14 (08/07 1100) BP: (96-106)/(56-71) 98/70 (08/07 1100) SpO2:  [97 %-98 %] 98 % (08/07 1100) Physical Exam Vitals and nursing note reviewed.  Constitutional:      Appearance: He is obese.  HENT:     Head: Normocephalic and atraumatic.  Eyes:     Conjunctiva/sclera: Conjunctivae normal.  Pulmonary:     Effort: Pulmonary effort is normal.  Skin:  General: Skin is warm and dry.  Neurological:     Mental Status: He is alert and oriented to person, place, and time.  Psychiatric:        Mood and Affect: Mood is anxious. Affect is labile and angry.        Speech: Speech is rapid and pressured.        Behavior: Behavior  is uncooperative, agitated and aggressive.        Thought Content: Thought content is paranoid and delusional.        Cognition and Memory: Cognition is impaired.        Judgment: Judgment is impulsive.     Blood pressure 98/70, pulse (!) 103, temperature 98.4 F (36.9 C), temperature source Oral, resp. rate 14, height 5\' 8"  (1.727 m), weight 113.4 kg, SpO2 98%. Body mass index is 38.01 kg/m.   Other History   These have been pulled in through the EMR, reviewed, and updated if appropriate.   Family History:  The patient's family history includes Heart disease in his mother; Hypertension in an other family member.  Medical History: Past Medical History:  Diagnosis Date   CHF (congestive heart failure) (HCC)    Depression    Diabetes mellitus    Hypertension    Myocardial infarction (HCC)    Sleep apnea    Sleep apnea    Tachycardia     Surgical History: Past Surgical History:  Procedure Laterality Date   CHOLECYSTECTOMY     knee  3 knee surgeries    Medications:   Current Facility-Administered Medications:    acetaminophen (TYLENOL) tablet 1,000 mg, 1,000 mg, Oral, Q6H PRN **OR** acetaminophen (TYLENOL) suppository 650 mg, 650 mg, Rectal, Q6H PRN, Shitarev, Dimitry, MD   busPIRone (BUSPAR) tablet 10 mg, 10 mg, Oral, TID, Yanisa Goodgame, Byrd Hesselbach, MD, 10 mg at 09/06/22 1027   camphor-menthol (SARNA) lotion, , Topical, PRN, Shelby Mattocks, DO   divalproex (DEPAKOTE) DR tablet 500 mg, 500 mg, Oral, Q12H, Maricia Scotti, Byrd Hesselbach, MD   [START ON 09/07/2022] enoxaparin (LOVENOX) injection 50 mg, 50 mg, Subcutaneous, Daily, Shitarev, Dimitry, MD   FLUoxetine (PROZAC) capsule 20 mg, 20 mg, Oral, Daily, Rhondalyn Clingan, Byrd Hesselbach, MD, 20 mg at 09/06/22 1027   fluticasone (FLONASE) 50 MCG/ACT nasal spray 2 spray, 2 spray, Each Nare, Daily, Alicia Amel, MD, 2 spray at 09/05/22 1039   gabapentin (NEURONTIN) capsule 600 mg, 600 mg, Oral, TID, Shitarev,  Dimitry, MD, 600 mg at 09/06/22 1027   Gerhardt's butt cream, , Topical, BID, Chambliss, Estill Batten, MD, Given at 09/04/22 2212   hydrOXYzine (ATARAX) tablet 25 mg, 25 mg, Oral, TID PRN, Cody Dys, MD   melatonin tablet 5 mg, 5 mg, Oral, QHS PRN, Cody Dys, MD   metoprolol tartrate (LOPRESSOR) tablet 25 mg, 25 mg, Oral, BID, Darnelle Spangle B, MD, 25 mg at 09/06/22 1027   midazolam (VERSED) injection 2 mg, 2 mg, Intravenous, PRN, Alicia Amel, MD   midazolam (VERSED) injection 2 mg, 2 mg, Intravenous, Once, de Saintclair Halsted, Cortney E, NP   QUEtiapine (SEROQUEL) tablet 25 mg, 25 mg, Oral, BID PRN, 25 mg at 09/06/22 1027 **OR** OLANZapine (ZYPREXA) injection 2.5 mg, 2.5 mg, Intramuscular, BID PRN, Cody Dys, MD   pantoprazole (PROTONIX) EC tablet 40 mg, 40 mg, Oral, Daily, Darnelle Spangle B, MD, 40 mg at 09/05/22 1036   QUEtiapine (SEROQUEL) tablet 200 mg, 200 mg, Oral, QHS, Brette Cast, Byrd Hesselbach,  MD   tamsulosin (FLOMAX) capsule 0.4 mg, 0.4 mg, Oral, Daily, Darnelle Spangle B, MD, 0.4 mg at 09/05/22 1036  Allergies: Allergies  Allergen Reactions   Aleve [Naproxen Sodium] Anaphylaxis   Lisinopril Other (See Comments)    COUGH    Metformin And Related Diarrhea

## 2022-09-06 NOTE — Care Management Important Message (Signed)
Important Message  Patient Details  Name: Cody Nolan MRN: [id number] Date of Birth: 1960/12/12   Medicare Important Message Given:  Yes     Renie Ora 09/06/2022, 8:11 AM

## 2022-09-06 NOTE — Progress Notes (Signed)
Patient refusing to have CBG checks currently.

## 2022-09-06 NOTE — Progress Notes (Signed)
ID Brief note  No plans for LP as unable to get consent in the setting of AMS per primary team   Would  recommend to start Pen G 18-24 million continuous units daily for 14 days. Also give one dose of 2.4 million units of Benzathine Pen G 1 dose after completion of IV course   8/6 Hep B surface ag NR, HCV ab pending , urine GC not collected   Would need monitoring of RPR titre in 6, 12 and 24 months following treatment   D/w primary team   Odette Fraction, MD Infectious Disease Physician [facility] [address] [place], [place] [address] Phone: [phone]  Fax: [phone]

## 2022-09-06 NOTE — Progress Notes (Signed)
Daily Progress Note Intern Pager: [phone]  Patient name: Cody Nolan Medical record number: [id number] Date of birth: [birth year] Age: 62 y.o. Gender: male  Primary Care Provider: Lockie Mola, MD Consultants: Psychiatry, Infectious Disease Code Status: FULL  Pt Overview and Major Events to Date:  8/4 - Admitted, confused 8/5 - Mental status improving 8/6 - Treponemal antibodies reactive.  ID consulted, LP planned. 8/7 - LP per neurology  Assessment and Plan: Cody Nolan is a 62 y.o. male with a pertinent PMH of diabetes, HTN, CHF, and prior MI who presented with altered mentation and ?seizure-like activity when brought in by police from the community and was admitted for substance-induced psychosis and AKI, currently IVC and getting workup for neurosyphilis.  [facility]     * (Principal) AMS (altered mental status)     Alert and oriented this morning, appears less anxious, but still  disorganized and tangential. Given patient's unilateral weakness, hearing  loss, and questionable baseline mental status, concern for neurosyphilis  is high and ID is on board. Differential also includes drug-induced  bipolar disorder/psychosis and seizure, though EEG was normal 8/6. - Appreciate psych consult, will transfer to IP psych when patient  completes syphilis treatment course (timeline pending LP) - Neurology signed off, no recs for AED or further workup - Continue seroquel - Seizure precautions - PRN Versed for seizures - LP for neurosyphilis per Neurology, appreciate ID recs        Controlled diabetes mellitus type 2 with complications (HCC)     A1c 6.8% one year ago, now 6.4 this admission. Taking 22 units of  Tresiba BID at home. Patient refusing CBGs inpatient at this time. - CBGs Q4h, add insulin as appropriate        History of MI (myocardial infarction)     Documented in chart, unable to find more detail.  - On Statin (held in setting of elevated CK  for now) - Home B blocker, lopressor 25mg  BID - Not on ASA, states MI was a "long time ago"        Chronic pain     Does not complain of pain today, states pain is improved on gabapentin  when asked. XR of back in May 2024 with degenerative changes. Reassured by  activity and mobilization on exam. - Sarna lotion to affected areas for now, can consider voltaren gel for  pain though has allergy to naproxen - Continue gabapentin home dose 600 mg TID        Irritation of skin of perianal region     Irritation evident on exam. Possibly secondary to inadequate hygiene,  does appear to be sitting in soiled bed sheets. - Desitin ointment with each bowel movement - Sarna lotion as needed - Ensure adequately cleaned after bowel movements        Substance or medication-induced bipolar and related disorder with onset  during intoxication (HCC)     Syphilis     RPR positive on admission, confirmed with treponemal antibody test.   Concern for neurosyphilis, cannot ascertain past syphilis infection  history, obtaining diagnostic LP.  PT/INR w/in limits for LP. - LP to be performed, appreciate ID recs - Will need follow up with non-treponemal test in 6 months and 12 months        Positive RPR test   FEN/GI: Regular diet, PIV w/out fluids PPx: Lovenox, held 8/7 for LP Dispo:  Likely to inpatient psych pending clinical improvement,  syphilis treatment.  Subjective:  Spent extensive time speaking the patient this morning.  Discussed his medication regimen and events leading up to his admission.  Patient remains paranoid, stating that his Seroquel is "being dropped on purpose by nurses."  Of note, Seroquel has been administered as prescribed daily per [facility] review.  Patient also feels staff are "laughing at him and he is the butt of a joke."  Patient remains distractible, disorganized, and has somewhat pressured speech.  Does appear overall calmer than yesterday.  Takes offense that discussion of  possible psychiatric hospitalization following medical workup.  Refuses absolutely to go to behavioral health hospital.  Is not aware that he has been [facility], chose not to pursue that further at this time.  Was able to reestablish rapport somewhat after the conversation about [facility], but patient remains upset and states he does not want to be "sent anywhere until you figure out what is wrong with my mind."  Assured patient we will work to figure out what caused his presenting symptoms prior to discharge.  Objective: Temp:  [97.6 F (36.4 C)-98.9 F (37.2 C)] 98.3 F (36.8 C) (08/07 0733) Pulse Rate:  [92-120] 111 (08/07 0733) Resp:  [18-20] 18 (08/07 0733) BP: (96-106)/(56-71) 106/68 (08/07 0733) SpO2:  [97 %-98 %] 98 % (08/07 0733)  Physical Exam: General: Age-appropriate, resting comfortably in bed, NAD, WNWD, alert and at baseline. Cardiovascular: Intermittently tachycardic, regular rhythm. Normal S1/S2. No murmurs, rubs, or gallops appreciated. 2+ radial pulses. Skin: Warm and dry.  Old scars on L shoulder and R cheek. Extremities: No peripheral edema bilaterally. Psych: Distractible, moderately irritable mood, tangential reasoning with paranoid ideation.  Some grandiosity of ideas, feeling of persecution.  Laboratory: Most recent CBC Lab Results  Component Value Date   WBC 12.0 (H) 09/06/2022   HGB 11.7 (L) 09/06/2022   HCT 35.6 (L) 09/06/2022   MCV 89.4 09/06/2022   PLT 314 09/06/2022   Most recent BMP    Latest Ref Rng & Units 09/05/2022    8:42 AM  BMP  Glucose 70 - 99 mg/dL 161   BUN 8 - 23 mg/dL 10   Creatinine 0.96 - 1.24 mg/dL 0.45   Sodium 409 - 811 mmol/L 139   Potassium 3.5 - 5.1 mmol/L 3.6   Chloride 98 - 111 mmol/L 104   CO2 22 - 32 mmol/L 23   Calcium 8.9 - 10.3 mg/dL 8.9     Other pertinent labs: - None  New Imaging/Diagnostic Tests: - None  Antonae Zbikowski, MD 09/06/2022, 10:49 AM [facility]  FMTS Intern pager: [phone], text pages  welcome Secure chat group [facility] Teaching Service

## 2022-09-07 DIAGNOSIS — A530 Latent syphilis, unspecified as early or late: Secondary | ICD-10-CM | POA: Diagnosis not present

## 2022-09-07 DIAGNOSIS — F19929 Other psychoactive substance use, unspecified with intoxication, unspecified: Secondary | ICD-10-CM | POA: Diagnosis not present

## 2022-09-07 DIAGNOSIS — R4182 Altered mental status, unspecified: Secondary | ICD-10-CM | POA: Diagnosis not present

## 2022-09-07 DIAGNOSIS — F1994 Other psychoactive substance use, unspecified with psychoactive substance-induced mood disorder: Secondary | ICD-10-CM | POA: Diagnosis not present

## 2022-09-07 DIAGNOSIS — A523 Neurosyphilis, unspecified: Secondary | ICD-10-CM | POA: Diagnosis not present

## 2022-09-07 MED ORDER — PENICILLIN G BENZATHINE 1200000 UNIT/2ML IM SUSY
2.4000 10*6.[IU] | PREFILLED_SYRINGE | Freq: Once | INTRAMUSCULAR | Status: AC
  Administered 2022-09-21: 2.4 10*6.[IU] via INTRAMUSCULAR
  Filled 2022-09-07: qty 4

## 2022-09-07 NOTE — Consult Note (Addendum)
[name] Consult Evaluation  Service Date: September 07, 2022 LOS:  LOS: 4 days    Primary Psychiatric Diagnoses  Substance induced bipolar disorder   Assessment  Cody Nolan is a 62 y.o. male admitted medically for 09/02/2022  7:57 PM for ?seizure vs stroke. He carries the psychiatric diagnoses of depression from PCP and has a past medical history of  CHF, diabetes, HTN, MI, sleep apnea. Psychiatry was consulted for Paranoid and tangential, no documented history of psychosis  by Dr. Marisue Humble.  His current presentation of pressured, tangential speech, grandiosity, lack of sleep, is most consistent with a diagnosis of mania; given recent substance use and absence of formal bipolar diagnosis this is most likely substance induced mood disorder. His diagnosis of neurosyphillis is a complicating factor, but one that might allow him to avoid hospitalization in the inpatient psychiatric hospital if he can be stabilized enough prior to his medical discharge. He is currently amenable to both medical treatment (seizure, ?stroke) and psychiatric treatment after he is medically stable. Given his alleged bizarre behavior, threats to family, etc he meets criteria for inpt psychiatric hospitalization under involuntary commitment. Pt currently denies any cooperation with regards to inpatient psychiatric hospitalization. Current outpatient psychotropic medications include prozac and buspirone and historically he has had a unknown response to these medications. He was semi compliant with medications prior to admission as evidenced by fill hx. Patient is showing good response to the combination of increased seroquel at night and depakote.   Patient feels that many aspects of his life are out of control, and giving him as much ownership over his medical decision-making as possible will go a long way to winning his cooperation. (PRN medication of seroquel, hydroxyzine).    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 Patient appears to have capacity for most minor decisions.  ## Further Work-up:  -- agree with thorough w/u from primary and neuro teams -- UDS showed positive for benzodiazepines (iatrogenic), amphetamines, cocaine, THC  -- most recent EKG on 8/3 had QtC of 437 in setting of sinus tach -- Pertinent labwork reviewed earlier this admission includes: elevated CK (1003 on 8/4), HgbA1c 6.4, Reactive RPR (titer 1:16), proteinuria, hypocalcemia  and hypokalemia.  ## Disposition:  -- We recommend inpatient psychiatric hospitalization after medical hospitalization. Patient has been involuntarily committed on 09/04/2022.   ## Behavioral / Environmental:  --  Utilize compassion and acknowledge the patient's experiences while setting clear and realistic expectations for care.  ## Safety and Observation Level:  - Based on my clinical evaluation, I estimate the patient to be at  low risk of self harm in the current setting  - At this time, we recommend a 1:1 level of observation due to our concern of his acute  mental status. We may consider discontinuing the sitter in the next 48 hours.   Thank you for this consult request. Recommendations have been communicated to the primary team. We will continue to follow at this time.   [name], MD  Psychiatric and Social History   Relevant Aspects of Hospital Course:  Admitted on 09/02/2022 for agitation and bizarre behavior after a witnessed seizure.   Patient Report:  8/8: Met patient this morning at bedside. Patient was in a much better mood than yesterday. He is less manic, paranoid, his speech is less pressured, less irritable and labile. Pt still has concerns about large groups of people.   Pt did not make grandiose financial statements, but still promised to put the writer in the [place] [employer]. Pt mentioned he still has concerns about the location of his car, but he was able to carefully and calmly explain his concerns. Discussed his need for an LP to evaluate for neurosyphilis, patient said he would fully cooperate with that plan.   Discussed the addition of depakote to his regimen, he was able to clearly follow the conversation and ask meaningful questions.  Throughout the course of the interview, he denied suicidal thoughts and homicidal thoughts. He was not responding to internal stimuli.  Discussed the need for the sitter, and my incorrect actions in terminating the camera several days ago. He was understanding and the therapeutic alliance has improved dramatically since day one. He re-iterated that he would fully comply with the recommendations of the team. Pt does not have full insight into the actions he was taking prior to admission. Would like to revisit those with him tomorrow.  Pt remains paranoid. He grew extremely agitated when the larger psychiatric team of four was present. Took approximately 10 minutes to calm him down and re-earn his trust. Refrain from taking large groups into his room.  Psych  ROS:  Negative for poor sleep, lability, tangentiality (now circumstantial). Speech still borderline pressured.   Collateral information:  Pt offered the phone number of his attorney   Psychiatric History:  Information collected from pt, medical record  Prev Dx/Sx: ??? Depression and anxiety from prescriptions, potentially PTSD Current Psych Provider: PCP Home Meds (current): prozac, buspirone Previous Med Trials: unknown Therapy: unknown  Prior ECT: unknown Prior Psych Hospitalization: unknown  Prior Self Harm: denied  Family Psych History: unknown Family Hx suicide: unknown  Social History:  Living Situation: was living with [name] and her husband  Substance History Drug use: Cocaine, Amphetamines, THC   Exam Findings   Psychiatric Specialty Exam:  Presentation  General Appearance: Casual; Disheveled  Eye Contact:Good  Speech:Pressured  Speech Volume:Increased  Handedness:Right   Mood and Affect  Mood:Euphoric; Dysphoric; Labile  Affect:Labile   Thought Process  Thought Processes:Linear  Descriptions of Associations:Tangential  Orientation:Full (Time, Place and Person)  Thought Content:Delusions; Paranoid Ideation; Tangential  Hallucinations:Hallucinations: None  Ideas of Reference:None  Suicidal Thoughts:Suicidal Thoughts: No  Homicidal Thoughts:Homicidal Thoughts: No   Sensorium  Memory:Immediate Fair; Recent Good; Remote Good  Judgment:Fair  Insight:Poor   Executive Functions  Concentration:Fair  Attention Span:Fair  Recall:Fair  Fund of Knowledge:Good  Language:Good   Psychomotor Activity  Psychomotor Activity:Psychomotor Activity: Normal   Assets  Assets:Communication Skills; Desire for Improvement   Sleep  Sleep:Sleep: Poor  Physical Exam: Vital signs:  Temp:  [97.3 F (36.3 C)-98.4 F (36.9 C)] 97.8 F (36.6 C) (08/08 0819) Pulse Rate:  [99-103] 101 (08/08 0427) Resp:  [14-18] 16 (08/08  0427) BP: (86-115)/(62-86) 86/62 (08/08 0819) SpO2:  [96 %-98 %] 98 % (08/08 0427) Physical Exam Vitals and nursing note reviewed.  Constitutional:      Appearance: He is obese.  HENT:     Head: Normocephalic and atraumatic.  Eyes:     Conjunctiva/sclera: Conjunctivae normal.  Pulmonary:     Effort: Pulmonary effort is normal.  Skin:    General: Skin is warm and dry.  Neurological:     Mental Status: He is alert and oriented to person, place, and time.  Psychiatric:        Mood and Affect: Mood is anxious. Affect is labile and angry.        Speech: Speech is rapid and pressured.        Behavior: Behavior is uncooperative, agitated and aggressive.        Thought Content: Thought content is paranoid and delusional.        Cognition and Memory: Cognition is impaired.        Judgment: Judgment is impulsive.     Blood pressure (!) 86/62, pulse (!) 101, temperature 97.8 F (36.6 C), temperature source Oral, resp. rate 16, height 5\' 8"  (1.727 m), weight 113.4 kg, SpO2 98%. Body mass index is 38.01 kg/m.   Other History   These have been pulled in through the EMR, reviewed, and updated if appropriate.   Family History:  The patient's family history includes Heart disease in his mother; Hypertension in an other family member.  Medical History: Past Medical History:  Diagnosis Date   CHF (congestive heart failure) (HCC)    Depression    Diabetes mellitus    Hypertension    Myocardial infarction (HCC)    Sleep apnea    Sleep apnea    Tachycardia     Surgical History: Past Surgical History:  Procedure Laterality Date   CHOLECYSTECTOMY     knee  3 knee surgeries    Medications:   Current Facility-Administered Medications:    acetaminophen (TYLENOL) tablet 1,000 mg, 1,000 mg, Oral, Q6H PRN, 1,000 mg at 09/06/22 2157 **OR** acetaminophen (TYLENOL) suppository 650 mg, 650 mg, Rectal, Q6H PRN, Shitarev, Dimitry, MD   busPIRone (BUSPAR) tablet 10 mg, 10 mg, Oral, TID,  Irmgard Rampersaud, Byrd Hesselbach, MD, 10 mg at 09/06/22 2156   camphor-menthol (SARNA) lotion, , Topical, PRN, Shelby Mattocks, DO   divalproex (DEPAKOTE) DR tablet 500 mg, 500 mg, Oral, Q12H, Talik Casique, Byrd Hesselbach, MD, 500 mg at 09/06/22 2156   enoxaparin (LOVENOX) injection 50 mg, 50 mg, Subcutaneous, Daily, Shitarev, Dimitry, MD   FLUoxetine (PROZAC) capsule 20 mg, 20 mg, Oral, Daily, Meriah Shands, Byrd Hesselbach, MD, 20 mg at 09/06/22 1027   fluticasone (FLONASE) 50 MCG/ACT nasal spray 2 spray, 2 spray, Each Nare, Daily, Alicia Amel, MD, 2 spray at 09/05/22 1039   gabapentin (NEURONTIN) capsule 600 mg, 600 mg, Oral, TID, Shitarev, Dimitry, MD, 600 mg at 09/06/22 2155   Gerhardt's butt cream, , Topical, BID, Carney Living, MD, Given at 09/04/22 2212   hydrOXYzine (ATARAX) tablet 25 mg, 25 mg, Oral, TID PRN, [name], MD   melatonin tablet 5 mg, 5 mg, Oral, QHS PRN, [name], MD   metoprolol tartrate (LOPRESSOR) tablet 25 mg, 25 mg, Oral, BID, Darnelle Spangle  B, MD, 25 mg at 09/06/22 2157   midazolam (VERSED) injection 2 mg, 2 mg, Intravenous, PRN, Alicia Amel, MD   midazolam (VERSED) injection 2 mg, 2 mg, Intravenous, Once, de Saintclair Halsted, Cortney E, NP   QUEtiapine (SEROQUEL) tablet 25 mg, 25 mg, Oral, BID PRN, 25 mg at 09/06/22 1027 **OR** OLANZapine (ZYPREXA) injection 2.5 mg, 2.5 mg, Intramuscular, BID PRN, [name], MD   pantoprazole (PROTONIX) EC tablet 40 mg, 40 mg, Oral, Daily, Darnelle Spangle B, MD, 40 mg at 09/05/22 1036   penicillin G potassium 12 Million Units in dextrose 5 % 500 mL CONTINUOUS infusion, 12 Million Units, Intravenous, Q12H, Lockie Mola, MD, Last Rate: 41.7 mL/hr at 09/07/22 0551, 12 Million Units at 09/07/22 0551   QUEtiapine (SEROQUEL) tablet 200 mg, 200 mg, Oral, QHS, [name], MD, 200 mg at 09/06/22 2156   tamsulosin (FLOMAX) capsule 0.4 mg, 0.4 mg,  Oral, Daily, Darnelle Spangle B, MD, 0.4 mg at 09/06/22 1149  Allergies: Allergies  Allergen Reactions   Aleve [Naproxen Sodium] Anaphylaxis   Lisinopril Other (See Comments)    COUGH    Metformin And Related Diarrhea

## 2022-09-07 NOTE — Progress Notes (Signed)
Daily Progress Note Intern Pager: [phone]  Patient name: Cody Nolan Medical record number: [id number] Date of birth: 02-Sep-1960 Age: 62 y.o. Gender: male  Primary Care Provider: Lockie Mola, MD Consultants: Psychiatry, Infectious Disease Code Status: FULL  Pt Overview and Major Events to Date:  8/4 - Admitted, confused 8/5 - Mental status improving 8/6 - Treponemal antibodies reactive.  ID consulted, LP planned. 8/7 - LP per neurology   Assessment and Plan: Cody Nolan is a 62 y.o. male with a pertinent PMH of diabetes, HTN, CHF, and prior MI who presented with altered mentation and ?seizure-like activity when brought in by police from the community and was admitted for substance-induced psychosis and AKI, currently IVC and getting workup for neurosyphilis.  [facility]     * (Principal) AMS (altered mental status)     Alert and oriented this morning, more euthymic.  Previously likely  manic, now would classify as hypomanic.  Unclear what patient's baseline  is, but he is likely getting closer to it.  Concern for neurosyphilis is  high and could not obtain LP consent, ID recommends empiric treatment.   Differential still includes drug-induced bipolar disorder/psychosis. - Appreciate psych consult, psychiatric medications per psych  - May transfer to IP psych when patient completes syphilis 2 week  treatment course, but will interface with psych team later - Seizure precautions, PRN Versed for seizures - Syphilis treatment as below        Controlled diabetes mellitus type 2 with complications (HCC)     A1c 6.8% one year ago, now 6.4 this admission. Taking 22 units of  Tresiba BID at home. Patient refusing CBGs inpatient at this time. - CBGs Q4h, add insulin as appropriate        History of MI (myocardial infarction)     Documented in chart, unable to find more detail.  - On Statin (held in setting of elevated CK for now) - Home B blocker,  lopressor 25mg  BID - Not on ASA, states MI was a "long time ago"        Chronic pain     Does not complain of pain today, states pain is improved on gabapentin  when asked. XR of back in May 2024 with degenerative changes. Reassured by  activity and mobilization on exam. - Sarna lotion to affected areas for now, can consider voltaren gel for  pain though has allergy to naproxen - Continue gabapentin home dose 600 mg TID        Irritation of skin of perianal region     Irritation evident on exam. Possibly secondary to inadequate hygiene,  does appear to be sitting in soiled bed sheets. - Desitin ointment with each bowel movement - Sarna lotion as needed - Ensure adequately cleaned after bowel movements        Substance or medication-induced bipolar and related disorder with onset  during intoxication [facility])     Patient with UDS positive for all substances except opiates on  admission.  Does have history of substance use and on multiple centrally  acting meds.  Suspect polypharmacy contributed to his presentation.        Syphilis     RPR positive on admission, confirmed with treponemal antibody test.   Concern for neurosyphilis, cannot ascertain past syphilis infection  history, unable to obtain LP consent with HPOA. 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  Subjective:  This morning, patient reports that he is feeling "great" and credits his Seroquel.  Establishes multiple times that he trusts his medical team and is happy with his care.  Notes dissatisfaction with having a  sitter, but states that he is happier with his sitter today.  We briefly discuss his conversation with Dr. Paulino Rily, [facility] pharmacist yesterday whom he called yesterday.  Patient is greatly relieved and reassured by acknowledgement of his conversation with Dr. Raymondo Band and is happy to hear we are listening to him.  Objective: Temp:  [97.3 F (36.3 C)-98.2 F (36.8 C)] 97.5 F (36.4 C) (08/08 1201) Pulse Rate:  [99-102] 101 (08/08 0427) Resp:  [16-18] 16 (08/08 0427) BP: (86-115)/(62-86) 99/69 (08/08 1201) SpO2:  [96 %-98 %] 98 % (08/08 0427)  Physical Exam: General: Age-appropriate, resting comfortably in chair, NAD, WNWD, alert and at baseline. Cardiovascular: Tachycardic, regular rhythm. Normal S1/S2. No murmurs, rubs, or gallops appreciated. 2+ radial pulses. Pulmonary: Clear bilaterally to ascultation. No increased WOB, no accessory muscle usage on room air. No wheezes, rales, or crackles. Skin: Warm and dry. Erythematous skin in perianal region, caked with barrier cream, improved from yesterday. Extremities: No peripheral edema bilaterally. Capillary refill <2 seconds. Psych: Mood content more euthymic, sleepy during interview, interruptible but still tangential, no pressured speech  Laboratory: Most recent CBC Lab Results  Component Value Date   WBC 10.8 (H) 09/07/2022   HGB 10.9 (L) 09/07/2022   HCT 33.7 (L) 09/07/2022   MCV 88.9 09/07/2022   PLT 321 09/07/2022   Most recent BMP    Latest Ref Rng & Units 09/07/2022   12:01 AM  BMP  Glucose 70 - 99 mg/dL 914   BUN 8 - 23 mg/dL 9   Creatinine 7.82 - 9.56 mg/dL 2.13   Sodium 086 - 578 mmol/L 139   Potassium 3.5 - 5.1 mmol/L 3.6   Chloride 98 - 111 mmol/L 107   CO2 22 - 32 mmol/L 22   Calcium 8.9 - 10.3 mg/dL 8.7     Other pertinent labs: - HCV NR; HBV NR, though also nonimmune, urine GC pending  New Imaging/Diagnostic Tests: - None, LP deferred  Antoinetta Berrones [name], MD 09/07/2022, 1:11 PM [facility]  FMTS Intern pager: [phone], text pages welcome Secure chat group [facility] Teaching Service

## 2022-09-07 NOTE — Plan of Care (Signed)
  Problem: Education: Goal: Knowledge of General Education information will improve Description Including pain rating scale, medication(s)/side effects and non-pharmacologic comfort measures Outcome: Progressing   

## 2022-09-07 NOTE — Progress Notes (Addendum)
[facility] Infectious Diseases Follow Up Note  Patient Identification: Patient Name: Cody Nolan MRN: [id number] Admit Date: 09/02/2022  7:57 PM Age: 62 y.o.Today's Date: 09/07/2022  Reason for Visit: Concerns for neurosyphilis  Principal Problem:   AMS (altered mental status) Active Problems:   Controlled diabetes mellitus type 2 with complications (HCC)   History of MI (myocardial infarction)   Chronic pain   Irritation of skin of perianal region   Substance or medication-induced bipolar and related disorder with onset during intoxication (HCC)   Syphilis   Positive RPR test  Antibiotics:  Pen G 8/7-c   Lines/Hardware:   Interval Events: Remains afebrile Labs remarkable for WBC 10.8 Penicillin G started yesterday as unable to get content for LP On IVC per psychiatry   Assessment 56 Y O male with h/o HTN, DM, CHF, MI, Depression, OSA who presented to ED on 8/3 with possible seizures and ams. He was brought by police after being found acting erratically in the community.    # AMS: Presumed substance induced psychosis. UDS 8/4 positive for amphetamines, BZDs, cocaine and THC. Evaluated by Neurology. CT Head and MRI brain unremarkable for acute abnormality. EEG wnl.  Seen by Psychiatry, concerns for possible substance induced BPD 8/4 RPR 1: 16, T pallidum abs positive. Patient denies prior h/o of syphilis or ever being treated for it. No prior syphilis h/o or tx per Health department. [id number] new vision or hearing changes. He reports using glasses for reading  Unable to get LP due to inability to consent with AMS. Empiric Pen G started 8/8 with concerns for neurosyphilis 8/6 Hep B surface ag, HCV NR, urine GC not collected   Recommendations Complete 14 days of Pen G. EOT 8/20. Provide him one dose of Benzathine Pen G 2.4 million units IM after completion of IV course to complete tx for late latent syphilis Would need  RPR monitoring in 6, 12 and 24 months for which he can be followed up in the clinic when ready for discharge as well as discussion for PrEP ID will so for now, please recall if needed   Rest of the management as per the primary team. Thank you for the consult. Please page with pertinent questions or concerns.  ______________________________________________________________________ Subjective patient seen and examined at the bedside. No complaints. Continues to be paranoid and talkative   Vitals BP (!) 86/62 (BP Location: Left Arm)   Pulse (!) 101   Temp 97.8 F (36.6 C) (Oral)   Resp 16   Ht 5\' 8"  (1.727 m)   Wt 113.4 kg   SpO2 98%   BMI 38.01 kg/m   Physical Exam Constitutional:  adult male sitting in the bed, has a sitter    Comments:   Cardiovascular:     Rate and Rhythm: Normal rate and regular rhythm.     Heart sounds:  Pulmonary:     Effort: Pulmonary effort is normal.     Comments:   Abdominal:     Palpations: Abdomen is non distended     Tenderness:   Musculoskeletal:        General: No swelling or tenderness in peripheral joints   Skin:    Comments: No rashes   Neurological:     General: awake, alert and oriented, following commands   Psychiatric:        Mood and Affect: Mood normal.   Pertinent Microbiology Results for orders placed or performed during the hospital encounter of 07/05/12  MRSA PCR Screening  Status: Abnormal   Collection Time: 07/05/12  1:00 PM   Specimen: Nasopharyngeal  Result Value Ref Range Status   MRSA by PCR POSITIVE (A) NEGATIVE Final    Comment:        The GeneXpert MRSA Assay (FDA approved for NASAL specimens only), is one component of a comprehensive MRSA colonization surveillance program. It is not intended to diagnose MRSA infection nor to guide or monitor treatment for MRSA infections. RESULT CALLED TO, READ BACK BY AND VERIFIED WITH: [id number]   Pertinent Lab.    Latest Ref Rng & Units  09/07/2022   12:01 AM 09/06/2022    6:10 AM 09/03/2022    3:10 AM  CBC  WBC 4.0 - 10.5 K/uL 10.8  12.0  20.3   Hemoglobin 13.0 - 17.0 g/dL 21.3  08.6  57.8   Hematocrit 39.0 - 52.0 % 33.7  35.6  36.5   Platelets 150 - 400 K/uL 321  314  250       Latest Ref Rng & Units 09/07/2022   12:01 AM 09/05/2022    8:42 AM 09/04/2022    1:58 AM  CMP  Glucose 70 - 99 mg/dL 469  629  528   BUN 8 - 23 mg/dL 9  10  21    Creatinine 0.61 - 1.24 mg/dL 4.13  2.44  0.10   Sodium 135 - 145 mmol/L 139  139  137   Potassium 3.5 - 5.1 mmol/L 3.6  3.6  3.4   Chloride 98 - 111 mmol/L 107  104  106   CO2 22 - 32 mmol/L 22  23  22    Calcium 8.9 - 10.3 mg/dL 8.7  8.9  8.3      Pertinent Imaging today Plain films and CT images have been personally visualized and interpreted; radiology reports have been reviewed. Decision making incorporated into the Impression /   No results found.  I have personally spent 50 minutes involved in face-to-face and non-face-to-face activities for this patient on the day of the visit. Professional time spent includes the following activities: Preparing to see the patient (review of tests), Obtaining and/or reviewing separately obtained history (admission/discharge record), Performing a medically appropriate examination and/or evaluation , Ordering medications/tests/procedures, referring and communicating with other health care professionals, Documenting clinical information in the EMR, Independently interpreting results (not separately reported), Communicating results to the patient/family/caregiver, Counseling and educating the patient/family/caregiver and Care coordination (not separately reported).   Plan d/w requesting provider as well as ID pharm D  Note: This document was prepared using dragon voice recognition software and may include unintentional dictation errors.   Electronically signed by:   Odette Fraction, MD Infectious Disease Physician [facility] for  Infectious Disease Pager: [phone]

## 2022-09-07 NOTE — Progress Notes (Signed)
Brief Neuro Note:  I have made over 5 attempts between today and yesterday to get in touch with his Aunt who is listed as the emergency contact to get consent for the LP. Unfortunately, I have not been able to get in touch with her. Patient unfortunately is unable to consent for himself given his poor insight and psychosis. Since LP is not an emergency, we cannot proceed without informed consent.  If the team is able to identify a family member or guardian who can consent for the patient, please let us know. At this time, we will take him off our list. We were only asked to assist with LP so have not formally evaluated him.  Erick Blinks [facility]

## 2022-09-07 NOTE — Assessment & Plan Note (Addendum)
Patient with UDS positive for all substances except opiates on admission (benzos iatrogenic).  Does have history of substance use and on multiple centrally acting meds.  Suspect polypharmacy contributed to his presentation.

## 2022-09-08 DIAGNOSIS — F19929 Other psychoactive substance use, unspecified with intoxication, unspecified: Secondary | ICD-10-CM | POA: Diagnosis not present

## 2022-09-08 DIAGNOSIS — N179 Acute kidney failure, unspecified: Secondary | ICD-10-CM | POA: Diagnosis not present

## 2022-09-08 DIAGNOSIS — A523 Neurosyphilis, unspecified: Secondary | ICD-10-CM | POA: Diagnosis not present

## 2022-09-08 DIAGNOSIS — F1994 Other psychoactive substance use, unspecified with psychoactive substance-induced mood disorder: Secondary | ICD-10-CM | POA: Diagnosis not present

## 2022-09-08 DIAGNOSIS — R4182 Altered mental status, unspecified: Secondary | ICD-10-CM | POA: Diagnosis not present

## 2022-09-08 MED ORDER — QUETIAPINE FUMARATE 50 MG PO TABS
250.0000 mg | ORAL_TABLET | Freq: Every day | ORAL | Status: DC
  Administered 2022-09-08 – 2022-09-21 (×14): 250 mg via ORAL
  Filled 2022-09-08: qty 2
  Filled 2022-09-08 (×2): qty 1
  Filled 2022-09-08: qty 2
  Filled 2022-09-08 (×8): qty 1
  Filled 2022-09-08: qty 2
  Filled 2022-09-08 (×2): qty 1

## 2022-09-08 MED ORDER — ATORVASTATIN CALCIUM 80 MG PO TABS
80.0000 mg | ORAL_TABLET | Freq: Every day | ORAL | Status: DC
  Administered 2022-09-08 – 2022-09-22 (×15): 80 mg via ORAL
  Filled 2022-09-08 (×15): qty 1

## 2022-09-08 NOTE — Progress Notes (Signed)
Daily Progress Note Intern Pager: [phone]  Patient name: Cody Nolan Medical record number: [id number] Date of birth: [birth year] Age: 62 y.o. Gender: male  Primary Care Provider: Lockie Mola, MD Consultants: Psychiatry, Infectious Disease Code Status: FULL  Pt Overview and Major Events to Date:  8/4 - Admitted, confused 8/5 - Mental status improving 8/6 - Treponemal antibodies reactive.  ID consulted, LP planned. 8/7 - No per neurology, unable to obtain consent; started empiric Penicillin G IV 8/8 - Now hypomanic, further improved AMS 8/9 - More upset because he is IVC, continuing IVC   Assessment and Plan: Cody Nolan is a 62 y.o. male with a pertinent PMH of diabetes, HTN, CHF, and prior MI who presented with altered mentation and ?seizure-like activity when brought in by police from the community and was admitted for substance-induced psychosis and AKI, currently IVC and getting empiric treatment for neurosyphilis.  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   Concern for neurosyphilis, cannot ascertain past syphilis infection  history, unable to obtain LP consent with HPOA. - ID on board, appreciate recs - Empiric Pen G 24 MU continuous daily for 14 days (8/8-8/22) - Benzathine Pen G 2.4 MU x1 after completion of IV course  - Will need follow up with non-treponemal test in 6 months, 12 months, 24  months        Controlled diabetes mellitus type 2 with complications (HCC)     A1c 6.8% one year ago, now 6.4 this admission. Taking 22 units of  Tresiba BID at home. Patient refusing CBGs inpatient at this time. - CBGs Q4h, add insulin as appropriate        AMS (altered mental status)     Agitated this morning, more paranoid in setting of discovering he is  IVC.  Unclear what patient's baseline is, but he is worsened today from  yesterday.  Differential still includes  drug-induced bipolar  disorder/psychosis versus neurosyphilis. - Continue IVC due to concern for labile mental status and behavior - May transfer to IP psych when patient completes syphilis 2 week  treatment course, but will interface with psych team later - Seizure precautions, PRN Versed for seizures - Syphilis treatment as per primary problem - Appreciate psych consult, psychiatric medications managed per psych: -- Continue depakote 500 mg BID for mania -- Continue 200 mg seroquel at bedtime, 25 mg BID PRN agitation  -- Continue buspirone 10 mg TID for anxiety -- Continue fluoxetine 20 mg daily for depression -- Continue hydroxyzine 25 mg TID PRN for anxiety -- Continue Backup PRN geodon 2.5 mg PRN if pt refuses PO        Chronic pain     XR of back in May 2024 with degenerative changes. Reassured by good  mobility, unable to evaluate pain today. - Sarna lotion to affected areas for now, can consider voltaren gel for  pain though has allergy to naproxen - Continue gabapentin home dose 600 mg TID        Irritation of skin of perianal region     Irritation evident on previous exam. Possibly secondary to inadequate  hygiene. - Desitin ointment with each bowel movement - Sarna lotion as needed - Ensure adequately cleaned after bowel movements        Substance or medication-induced bipolar and related disorder with onset  during intoxication (HCC)  Patient with UDS positive for all substances except opiates on  admission (benzos iatrogenic).  Does have history of substance use and on  multiple centrally acting meds.  Suspect polypharmacy contributed to his  presentation.        Positive RPR test   Stable and chronic: History of MI: Home metoprolol 25 mg, held statin in setting of elevated CK, no ASA per patient preference  FEN/GI: Regular diet, PIV w/out fluids PPx: Lovenox Dispo: Possibly inpatient psych pending clinical improvement, syphilis treatment; will interface  with psychiatry as patient progresses.  Subjective:  Overnight, multiple notes from RN are noted conveying patient has paranoia concerning his medications, in particular his Seroquel, as he is concerned he is not getting the correct dose.  It appears patient learned he is currently IVC and became upset this morning.  Does express frustration with situation and disappointment with MD team over IVC decision, attempts to call lawyer repeatedly during conversation but only reaches voicemail for one [employer].  I attempted to reassure patient we are here to support him and offered patient venue to air his grievances.  Listened to patient, not interrupting, reviewed his handwritten medication list and cross referenced with our MAR.  He continued to spiral, perseverating and expressing some paranoid ideation concerning his medications not being administered correctly, becoming tearful and upset.  Chose to apologize and exit room to deescalate situation.  Objective: Temp:  [97.6 F (36.4 C)-98.5 F (36.9 C)] 98.3 F (36.8 C) (08/09 0832) Pulse Rate:  [90-99] 98 (08/09 0832) Resp:  [16-17] 16 (08/09 0356) BP: (95-107)/(53-70) 101/65 (08/09 0832) SpO2:  [93 %-100 %] 96 % (08/09 0832)  Physical Exam: General: Sitting on bed, agitated, alert. Psych: Emotionally labile, erratic, not interruptible, intermittently tearful, repeating train of thought, paranoid ideation. Patient declines further physical exam.  Laboratory: Most recent CBC Lab Results  Component Value Date   WBC 10.8 (H) 09/07/2022   HGB 10.9 (L) 09/07/2022   HCT 33.7 (L) 09/07/2022   MCV 88.9 09/07/2022   PLT 321 09/07/2022   Most recent BMP    Latest Ref Rng & Units 09/07/2022   12:01 AM  BMP  Glucose 70 - 99 mg/dL 161   BUN 8 - 23 mg/dL 9   Creatinine 0.96 - 0.45 mg/dL 4.09   Sodium 811 - 914 mmol/L 139   Potassium 3.5 - 5.1 mmol/L 3.6   Chloride 98 - 111 mmol/L 107   CO2 22 - 32 mmol/L 22   Calcium 8.9 - 10.3 mg/dL 8.7      Other pertinent labs: - None  New Imaging/Diagnostic Tests: - None  Vivika Poythress, MD 09/08/2022, 12:55 PM [facility]  FMTS Intern pager: [phone], text pages welcome Secure chat group [facility] Teaching Service

## 2022-09-08 NOTE — Consult Note (Signed)
Cody Nolan Psychiatry Consult Evaluation  Service Date: September 08, 2022 LOS:  LOS: 5 days    Primary Psychiatric Diagnoses  Substance induced bipolar disorder   Assessment  Cody Nolan is a 62 y.o. male admitted medically for 09/02/2022  7:57 PM for ?seizure vs stroke. He carries the psychiatric diagnoses of depression from PCP and has a past medical history of  CHF, diabetes, HTN, MI, sleep apnea. Psychiatry was consulted for Paranoid and tangential, no documented history of psychosis  by Dr. Marisue Humble.  His current presentation of pressured, tangential speech, grandiosity, lack of sleep, is most consistent with a diagnosis of mania; given recent substance use and absence of formal bipolar diagnosis this is most likely substance induced mood disorder. His diagnosis of neurosyphillis is a complicating factor, but one that might allow him to avoid hospitalization in the inpatient psychiatric hospital if he can be stabilized enough prior to his medical discharge. He is currently amenable to inpatient medical treatment for the syphilis, but not inpatient psychiatric treatment after he is medically stable. This is a sore topic, so avoid discussing it with him if possible.  Given his alleged bizarre behavior, threats to family, etc he meets criteria for inpt psychiatric hospitalization under involuntary commitment. Pt currently denies any cooperation with regards to inpatient psychiatric hospitalization. Current outpatient psychotropic medications include prozac and buspirone and historically he has had a unknown response to these medications. He was semi compliant with medications prior to admission as evidenced by fill hx. Patient is showing good response to the combination of increased seroquel at night and depakote.  Refrain from taking large groups into his room.  Patient will need a valproic acid level on 8/10 (order place) in the evening to assess for trough level. If it comes back low-therapeutic,  he would benefit from an increased dose.   Patient feels that many aspects of his life are out of control, and giving him as much ownership over his medical decision-making as possible will go a long way to winning his cooperation. (PRN medication of seroquel, hydroxyzine).    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 during intoxication (HCC)   Chronic pain   Irritation of skin of perianal region   Positive RPR test     Plan   ## Psychiatric Medication Recommendations:  -- Continue depakote 500 mg BID for mania  --Increase depakote level depending on his level on 8/10 -- Increase 250 mg seroquel at bedtime, 25 mg BID PRN agitation  -- Continue buspirone 10 mg TID for anxiety -- Continue fluoxetine 20 mg daily for depression -- Continue hydroxyzine 25 mg TID PRN for anxiety -- Continue Backup PRN geodon 2.5 mg PRN if pt refuses PO  ## Medical Decision Making Capacity:  -- Patient demonstrates clear understanding of costs, risks, and benefits of various medications. Patient appears to have capacity for most minor decisions, but is still too manic to leave on his own.  ## Further Work-up:  -- agree with thorough w/u from primary and neuro teams -- UDS showed positive for benzodiazepines (iatrogenic), amphetamines, cocaine, THC  -- most recent EKG on 8/3 had QtC of 437 in setting of sinus tach -- Pertinent labwork reviewed earlier this admission includes: elevated CK (1003 on 8/4), HgbA1c 6.4, Reactive RPR (titer 1:16), proteinuria, hypocalcemia  and hypokalemia.  ## Disposition:  -- We recommend inpatient psychiatric hospitalization after medical hospitalization. Patient has been involuntarily committed on 09/04/2022.   ## Behavioral / Environmental:  --   Utilize compassion and acknowledge the patient's experiences while setting clear and realistic expectations for care.  ## Safety and Observation Level:  - Based on my clinical evaluation, I estimate the patient to be at low risk of self harm in the current setting  - At this time, we recommend a 1:1 level of observation due to our concern of his acute mental status.   Thank you for this consult request. Recommendations have been communicated to the primary team. We will continue to follow at this time.   [name], MD  Psychiatric and Social History   Relevant Aspects of Hospital Course:  Admitted on 09/02/2022 for agitation and bizarre behavior after a witnessed seizure.   Patient Report:  8/9: Met patient this morning at bedside. Patient was in a much better mood than yesterday. He is less manic, paranoid, his speech is less pressured, less irritable and labile. Pt still has concerns about large groups of people.   Patient remains difficult to interrupt, but he is trying harder to listen when it is not his turn. Discussed going up on the depakote or the seroquel with him, he was willing to let us make adjustments to nighttime meds on either. Upon further review, we will recommend going up on the seroquel to 250mg  nightly before we adjust the depakote. Need to get a blood level of that before we adjust it.  Throughout the course of the interview, he denied suicidal thoughts and homicidal thoughts. He was not responding to internal stimuli.  Pt remains paranoid, even though he invited our team in, he grew  agitated when the larger psychiatric team of three was present. Took approximately 10 minutes to calm him down and re-earn his trust. Refrain from taking large groups into his room.  Psych ROS:  Negative for poor sleep, lability, tangentiality (now circumstantial). Speech still borderline pressured.   Collateral information:  Pt offered the phone number of his  attorney   Psychiatric History:  Information collected from pt, medical record  Prev Dx/Sx: ??? Depression and anxiety from prescriptions, potentially PTSD Current Psych Provider: PCP Home Meds (current): prozac, buspirone Previous Med Trials: unknown Therapy: unknown  Prior ECT: unknown Prior Psych Hospitalization: unknown  Prior Self Harm: denied  Family Psych History: unknown Family Hx suicide: unknown  Social History:  Living Situation: was living with [name] and her husband  Substance History Drug use: Cocaine, Amphetamines, THC   Exam Findings   Psychiatric Specialty Exam:  Presentation  General Appearance: Casual  Eye Contact:Good  Speech:Pressured  Speech Volume:Increased  Handedness:Right   Mood and Affect  Mood:Euthymic  Affect:Labile; Non-Congruent   Thought Process  Thought Processes:Coherent; Goal Directed  Descriptions of Associations:Circumstantial  Orientation:Full (Time, Place and Person)  Thought Content:Logical; Paranoid Ideation  Hallucinations:Hallucinations: None  Ideas of Reference:None  Suicidal Thoughts:Suicidal Thoughts: No  Homicidal Thoughts:Homicidal Thoughts: No   Sensorium  Memory:Immediate Good; Recent Fair; Remote Good  Judgment:Fair  Insight:Fair   Executive Functions  Concentration:Good  Attention Span:Fair  Recall:Fair  Fund of Knowledge:Good  Language:Good   Psychomotor Activity  Psychomotor Activity:Psychomotor Activity: Normal   Assets  Assets:Communication Skills; Desire for Improvement   Sleep  Sleep:Sleep: Fair    Physical Exam: Vital signs:  Temp:  [97.6 F (36.4 C)-98.5 F (36.9 C)] 98.3 F (36.8 C) (  08/09 0832) Pulse Rate:  [90-99] 98 (08/09 0832) Resp:  [16-17] 16 (08/09 0356) BP: (95-107)/(53-70) 101/65 (08/09 0832) SpO2:  [93 %-100 %] 96 % (08/09 0865) Physical Exam Vitals and nursing note reviewed.  Constitutional:      Appearance: He is obese.  HENT:      Head: Normocephalic and atraumatic.  Eyes:     Conjunctiva/sclera: Conjunctivae normal.  Pulmonary:     Effort: Pulmonary effort is normal.  Skin:    General: Skin is warm and dry.  Neurological:     Mental Status: He is alert and oriented to person, place, and time.  Psychiatric:        Mood and Affect: Mood is anxious. Affect is labile and angry.        Speech: Speech is rapid and pressured.        Behavior: Behavior is uncooperative, agitated and aggressive.        Thought Content: Thought content is paranoid and delusional.        Cognition and Memory: Cognition is impaired.        Judgment: Judgment is impulsive.     Blood pressure 101/65, pulse 98, temperature 98.3 F (36.8 C), temperature source Oral, resp. rate 16, height 5\' 8"  (1.727 m), weight 113.4 kg, SpO2 96%. Body mass index is 38.01 kg/m.   Other History   These have been pulled in through the EMR, reviewed, and updated if appropriate.   Family History:  The patient's family history includes Heart disease in his mother; Hypertension in an other family member.  Medical History: Past Medical History:  Diagnosis Date   CHF (congestive heart failure) (HCC)    Depression    Diabetes mellitus    Hypertension    Myocardial infarction (HCC)    Sleep apnea    Sleep apnea    Tachycardia     Surgical History: Past Surgical History:  Procedure Laterality Date   CHOLECYSTECTOMY     knee  3 knee surgeries    Medications:   Current Facility-Administered Medications:    acetaminophen (TYLENOL) tablet 1,000 mg, 1,000 mg, Oral, Q6H PRN, 1,000 mg at 09/08/22 1133 **OR** acetaminophen (TYLENOL) suppository 650 mg, 650 mg, Rectal, Q6H PRN, Shitarev, Dimitry, MD   atorvastatin (LIPITOR) tablet 80 mg, 80 mg, Oral, Daily, Shelby Mattocks, DO, 80 mg at 09/08/22 1133   busPIRone (BUSPAR) tablet 10 mg, 10 mg, Oral, TID, [name], MD, 10 mg at 09/08/22 7846   camphor-menthol (SARNA) lotion, ,  Topical, PRN, Shelby Mattocks, DO   divalproex (DEPAKOTE) DR tablet 500 mg, 500 mg, Oral, Q12H, Angelli Baruch, Byrd Hesselbach, MD, 500 mg at 09/08/22 0908   enoxaparin (LOVENOX) injection 50 mg, 50 mg, Subcutaneous, Daily, Shitarev, Dimitry, MD, 50 mg at 09/08/22 0907   FLUoxetine (PROZAC) capsule 20 mg, 20 mg, Oral, Daily, [name], MD, 20 mg at 09/08/22 0906   fluticasone (FLONASE) 50 MCG/ACT nasal spray 2 spray, 2 spray, Each Nare, Daily, Alicia Amel, MD, 2 spray at 09/07/22 1030   gabapentin (NEURONTIN) capsule 600 mg, 600 mg, Oral, TID, Shitarev, Dimitry, MD, 600 mg at 09/08/22 0907   Gerhardt's butt cream, , Topical, BID, Carney Living, MD, Given at 09/08/22 9629   hydrOXYzine (ATARAX) tablet 25 mg, 25 mg, Oral, TID PRN, [name], MD, 25 mg at 09/08/22 1132   melatonin tablet 5 mg, 5 mg, Oral, QHS PRN, [name], MD, 5 mg at 09/07/22 2111  metoprolol tartrate (LOPRESSOR) tablet 25 mg, 25 mg, Oral, BID, Darnelle Spangle B, MD, 25 mg at 09/08/22 1884   midazolam (VERSED) injection 2 mg, 2 mg, Intravenous, PRN, Alicia Amel, MD   midazolam (VERSED) injection 2 mg, 2 mg, Intravenous, Once, de Saintclair Halsted, Cortney E, NP   QUEtiapine (SEROQUEL) tablet 25 mg, 25 mg, Oral, BID PRN, 25 mg at 09/08/22 0512 **OR** OLANZapine (ZYPREXA) injection 2.5 mg, 2.5 mg, Intramuscular, BID PRN, [name], MD   pantoprazole (PROTONIX) EC tablet 40 mg, 40 mg, Oral, Daily, Darnelle Spangle B, MD, 40 mg at 09/08/22 0906   [START ON 09/21/2022] penicillin g benzathine (BICILLIN LA) 1200000 UNIT/2ML injection 2.4 Million Units, 2.4 Million Units, Intramuscular, Once, Odette Fraction, MD   penicillin G potassium 12 Million Units in dextrose 5 % 500 mL CONTINUOUS infusion, 12 Million Units, Intravenous, Q12H, Odette Fraction, MD, Last Rate: 41.7 mL/hr at 09/08/22 0339, 12 Million Units at 09/08/22 0339   QUEtiapine  (SEROQUEL) tablet 250 mg, 250 mg, Oral, QHS, Eyanna Mcgonagle, Byrd Hesselbach, MD   tamsulosin [facility]) capsule 0.4 mg, 0.4 mg, Oral, Daily, Alicia Amel, MD, 0.4 mg at 09/08/22 1660  Allergies: Allergies  Allergen Reactions   Aleve [Naproxen Sodium] Anaphylaxis   Lisinopril Other (See Comments)    COUGH    Metformin And Related Diarrhea

## 2022-09-08 NOTE — Progress Notes (Signed)
Patient requested to speak with the charge RN. Patient stated that the current sitter Corrie Dandy) was being disrespectful and rude to him. He also said that, "his HIPAA rights were being violated:Marland Kitchen  Patient also made claims that he wasn't getting his correct dosage of seroquel. Patient laid claims that, "the nurses were taking the seroquel b/c he wasn't getting the correct dosage.   Night shift RN went med by med with the patient and the patient concurred that it was correct.   Informed patient that I would have my director speak with him. Patient wanted this RN to come by before I leave so that he can tell me things that he couldn't say in front of others.   Patient also stated that, "he is not IVC'd but had 3 TIAs per neurology".

## 2022-09-08 NOTE — Progress Notes (Addendum)
Daily Progress Note Intern Pager: [phone]  Patient name: JOHNLEE MATSUYAMA Medical record number: [id number] Date of birth: 1960/09/29 Age: 62 y.o. Gender: male  Primary Care Provider: Lockie Mola, MD Consultants: psych, ID Code Status: full code  Pt Overview and Major Events to Date:  8/4 - Admitted, confused 8/5 - Mental status improving 8/6 - Treponemal antibodies reactive.  ID consulted, LP planned. 8/7 - No LP per neurology, unable to obtain consent; started empiric Penicillin G IV 8/8 - Now hypomanic, further improved AMS 8/9 - More upset because he is IVC, continuing IVC 8/10- ***  Assessment and Plan: ABDUL SHIVER is a 62 y.o. male with a pertinent PMH of diabetes, HTN, CHF, and prior MI who presented with altered mentation and ?seizure-like activity when brought in by police from the community and was admitted for substance-induced psychosis and AKI, currently IVC'd and being treated empirically for neurosyphilis.   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   Concern for neurosyphilis, cannot ascertain past syphilis infection  history, unable to obtain LP consent with HPOA. - ID on board, appreciate recs - Empiric Pen G 24 MU continuous daily for 14 days (8/8-8/22) - Benzathine Pen G 2.4 MU x1 after completion of IV course  - Will need follow up with non-treponemal test in 6 months, 12 months, 24  months        Controlled diabetes mellitus type 2 with complications (HCC)     A1c 6.8% one year ago, now 6.4 this admission. Taking 22 units of  Tresiba BID at home. Patient refusing CBGs inpatient at this time. - CBGs Q4h, add insulin as appropriate        AMS (altered mental status)     Agitated this morning, more paranoid in setting of discovering he is  IVC.  Unclear what patient's baseline is, but he is worsened today from  yesterday.  Differential still includes  drug-induced bipolar  disorder/psychosis versus neurosyphilis. - Continue IVC due to concern for labile mental status and behavior - May transfer to IP psych when patient completes syphilis 2 week  treatment course, but will interface with psych team later - Seizure precautions, PRN Versed for seizures - Syphilis treatment as per primary problem - Appreciate psych consult, psychiatric medications managed per psych: -- Continue depakote 500 mg BID for mania -- Continue 200 mg seroquel at bedtime, 25 mg BID PRN agitation  -- Continue buspirone 10 mg TID for anxiety -- Continue fluoxetine 20 mg daily for depression -- Continue hydroxyzine 25 mg TID PRN for anxiety -- Continue Backup PRN geodon 2.5 mg PRN if pt refuses PO        Chronic pain     XR of back in May 2024 with degenerative changes. Reassured by good  mobility, unable to evaluate pain today. - Sarna lotion to affected areas for now, can consider voltaren gel for  pain though has allergy to naproxen - Continue gabapentin home dose 600 mg TID        Irritation of skin of perianal region     Irritation evident on previous exam. Possibly secondary to inadequate  hygiene. 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  Does have history of substance use and on  multiple centrally acting meds.  Suspect polypharmacy contributed to his  presentation.        Positive RPR test    FEN/GI: *** PPx: *** Dispo:{FPTSDISOLIST:27587} {FPTSDISOTIME:27588}. Barriers include ***.   Subjective:  ***  Objective: Temp:  [97.6 F (36.4 C)-98.3 F (36.8 C)] 97.6 F (36.4 C) (08/09 2212) Pulse Rate:  [90-98] 93 (08/09 2212) Resp:  [16-18] 18 (08/09  2212) BP: (95-107)/(53-70) 98/65 (08/09 2212) SpO2:  [93 %-99 %] 97 % (08/09 2212)  Physical Exam   Laboratory: Most recent CBC Lab Results  Component Value Date   WBC 10.8 (H) 09/07/2022   HGB 10.9 (L) 09/07/2022   HCT 33.7 (L) 09/07/2022   MCV 88.9 09/07/2022   PLT 321 09/07/2022   Most recent BMP    Latest Ref Rng & Units 09/07/2022   12:01 AM  BMP  Glucose 70 - 99 mg/dL 782   BUN 8 - 23 mg/dL 9   Creatinine 9.56 - 2.13 mg/dL 0.86   Sodium 578 - 469 mmol/L 139   Potassium 3.5 - 5.1 mmol/L 3.6   Chloride 98 - 111 mmol/L 107   CO2 22 - 32 mmol/L 22   Calcium 8.9 - 10.3 mg/dL 8.7     Other pertinent labs none    Imaging/Diagnostic Tests: none Lorayne Bender, MD 09/08/2022, 11:04 PM  PGY-1, [facility] FPTS Intern pager: [phone], text pages welcome Secure chat group [facility] Teaching Service

## 2022-09-08 NOTE — TOC CM/SW Note (Signed)
Transition of Care [facility]) - Inpatient Brief Assessment   Patient Details  Name: HJALMER DANOWSKI MRN: [id number] Date of Birth: 11/24/1960  [facility] CM/SW Contact:    Darrold Span, RN Phone Number: 09/08/2022, 2:49 PM   Clinical Narrative: Note pt is currently under IVC-done 8/5- Psych following, may need INPT psych when medically cleared..  Currently getting IV PCN G per ID with end date 8/20.   We will continue to monitor patient advancement through interdisciplinary progression rounds. If new patient transition needs arise, please place a TOC consult.   Transition of Care Asessment: Insurance and Status: Insurance coverage has been reviewed Patient has primary care physician: Yes Home environment has been reviewed: home Prior level of function:: self Prior/Current Home Services: No current home services Social Determinants of Health Reivew: SDOH reviewed no interventions necessary Readmission risk has been reviewed: Yes Transition of care needs: transition of care needs identified, TOC will continue to follow

## 2022-09-08 NOTE — Progress Notes (Signed)
Called into patients room, as patient was requesting a bed change because he was sweating.   Patient ambulatory to bedside chair for linen change, vital signs taken at this time as well.   Patient requesting this nurse to print off a list of his medications and states "this is the first time my medications have been the right dosage" Patient also began to tell this nurse that he is "not committed" but "had a life and death experience"  I ensured patient that I am not involved with that part of his care, that I am here to take care of him regardless. Patient verbalizes understanding and expresses thanks for helping him. Sitter remained at bedside during care and assisted with linen change.

## 2022-09-08 NOTE — Progress Notes (Signed)
Called into patients room for PRN Seroquel. Charge RN went with this nurse to bedside, to witness patient verifying his own medication. While in the room the patient began to say that the new sitter was "being rude and smiling" when restated to patient that he felt like smiling was being rude to him, patient stated that "he's not helping me" I asked patient what he needed help with and patient could not find anything that he needed help with at this time. No other needs verbalized at this time.

## 2022-09-09 DIAGNOSIS — F19929 Other psychoactive substance use, unspecified with intoxication, unspecified: Secondary | ICD-10-CM | POA: Diagnosis not present

## 2022-09-09 DIAGNOSIS — F1994 Other psychoactive substance use, unspecified with psychoactive substance-induced mood disorder: Secondary | ICD-10-CM | POA: Diagnosis not present

## 2022-09-09 DIAGNOSIS — E878 Other disorders of electrolyte and fluid balance, not elsewhere classified: Secondary | ICD-10-CM | POA: Diagnosis not present

## 2022-09-09 DIAGNOSIS — A523 Neurosyphilis, unspecified: Secondary | ICD-10-CM | POA: Diagnosis not present

## 2022-09-09 MED ORDER — LIDOCAINE 5 % EX PTCH
1.0000 | MEDICATED_PATCH | Freq: Every day | CUTANEOUS | Status: DC
  Administered 2022-09-09 – 2022-09-22 (×12): 1 via TRANSDERMAL
  Filled 2022-09-09 (×12): qty 1

## 2022-09-09 MED ORDER — DICLOFENAC SODIUM 1 % EX GEL
2.0000 g | Freq: Four times a day (QID) | CUTANEOUS | Status: DC
  Filled 2022-09-09: qty 100

## 2022-09-09 NOTE — Consult Note (Addendum)
Cody Nolan  Service Date: September 09, 2022 LOS:  LOS: 6 days    Primary Psychiatric Diagnoses  Substance induced bipolar disorder   Assessment  Cody Nolan is a 62 y.o. male admitted medically for 09/02/2022  7:57 PM for ?seizure vs stroke. He carries the psychiatric diagnoses of depression from PCP and has a past medical history of  CHF, diabetes, HTN, MI, sleep apnea. Psychiatry was consulted for Paranoid and tangential, no documented history of psychosis  by Dr. Marisue Humble.  His current presentation of pressured, tangential speech, grandiosity, lack of sleep, is most consistent with a diagnosis of mania; given recent substance use and absence of formal bipolar diagnosis this is most likely substance induced mood disorder. His diagnosis of neurosyphillis is a complicating factor, but one that might allow him to avoid hospitalization in the inpatient psychiatric hospital if he can be stabilized enough prior to his medical discharge. He is currently amenable to inpatient medical treatment for the syphilis, but not inpatient psychiatric treatment after he is medically stable. This is a sore topic, so avoid discussing it with him if possible.  Given his alleged bizarre behavior, threats to family, etc he meets criteria for inpt psychiatric hospitalization under involuntary commitment. Pt currently denies any cooperation with regards to inpatient psychiatric hospitalization. Current outpatient psychotropic medications include prozac and buspirone and historically he has had a unknown response to these medications. He was semi compliant with medications prior to admission as evidenced by fill hx. Patient is showing good response to the combination of increased seroquel at night and depakote.  Refrain from taking large groups into his room.  Patient will need a valproic acid level on 8/10 (order place) in the evening to assess for trough level. If it comes back low-therapeutic,  he would benefit from an increased dose.   Patient feels that many aspects of his life are out of control, and giving him as much ownership over his medical decision-making as possible will go a long way to winning his cooperation. (PRN medication of seroquel, hydroxyzine).    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 Active Problems:   Controlled diabetes mellitus type 2 with complications (HCC)   AMS (altered mental status)   Chronic pain   Irritation of skin of perianal region   Substance or medication-induced bipolar and related disorder with onset during intoxication (HCC)   Positive RPR test     Plan   ## Psychiatric Medication Recommendations:  -- Continue depakote 500 mg BID for mania  --Increase depakote level pending Depakote Level -- Continue 250 mg seroquel at bedtime, 25 mg BID PRN agitation  -- Continue buspirone 10 mg TID for anxiety -- Continue fluoxetine 20 mg daily for depression -- Continue hydroxyzine 25 mg TID PRN for anxiety -- Continue Backup PRN geodon 2.5 mg PRN if pt refuses PO  ## Medical Decision Making Capacity:  -- Patient demonstrates clear understanding of costs, risks, and benefits of various medications. Patient appears to have capacity for most minor decisions, but is still too manic to leave on his own.  ## Further Work-up:  -- agree with thorough w/u from primary and neuro teams -- UDS showed positive for benzodiazepines (iatrogenic), amphetamines, cocaine, THC  -- most recent EKG on 8/3 had QtC of 437 in setting of sinus tach -- Pertinent labwork reviewed earlier this admission includes: elevated CK (1003 on 8/4), HgbA1c 6.4, Reactive RPR (titer 1:16), proteinuria, hypocalcemia  and hypokalemia.  ##  Disposition:  -- We recommend inpatient psychiatric hospitalization after medical hospitalization. Patient has been involuntarily committed on 09/04/2022.   ## Behavioral / Environmental:  --  Utilize  compassion and acknowledge the patient's experiences while setting clear and realistic expectations for care.  ## Safety and Observation Level:  - Based on my clinical Nolan, I estimate the patient to be at low risk of self harm in the current setting  - At this time, we recommend a 1:1 level of observation due to our concern of his acute mental status.   Thank you for this consult request. Recommendations have been communicated to the primary team. We will continue to follow at this time.   Harlin Heys, DO  Psychiatric and Social History   Relevant Aspects of Hospital Course:  Admitted on 09/02/2022 for agitation and bizarre behavior after a witnessed seizure.   Patient Report:  8/9: Met patient this morning at bedside. Patient was in a much better mood than yesterday. He is less manic, paranoid, his speech is less pressured, less irritable and labile. Pt still has concerns about large groups of people.   Patient remains difficult to interrupt, but he is trying harder to listen when it is not his turn. Discussed going up on the depakote or the seroquel with him, he was willing to let us make adjustments to nighttime meds on either. Upon further review, we will recommend going up on the seroquel to 250mg  nightly before we adjust the depakote. Need to get a blood level of that before we adjust it.  Throughout the course of the interview, he denied suicidal thoughts and homicidal thoughts. He was not responding to internal stimuli.  Pt remains paranoid, even though he invited our team in, he grew  agitated when the larger psychiatric team of three was present. Took approximately 10 minutes to calm him down and re-earn his trust. Refrain from taking large groups into his room.  8/10 Patient seen laying in bed this afternoon on my approach accompanied by sitter at bedside. The patient reports that he is doing better with his current medication regimen and he feels calmer than he has  been in the past. The patient is focused on having his IVC removed and does not believe that he needs to be admitted to a psychiatric facility in the future. He is amenable to having his medication adjusted. He admits to having some concerns about his therapeutic alliance with the psychiatric team but he is becoming more trusting due to the results he is getting from the medication changes. He denies any SI/HI/AVH.  Psych ROS:  Negative for poor sleep, lability, tangentiality (now circumstantial). Speech still borderline pressured.   Collateral information:  Pt offered the phone number of his attorney   Psychiatric History:  Information collected from pt, medical record  Prev Dx/Sx: ??? Depression and anxiety from prescriptions, potentially PTSD Current Psych Provider: PCP Home Meds (current): prozac, buspirone Previous Med Trials: unknown Therapy: unknown  Prior ECT: unknown Prior Psych Hospitalization: unknown  Prior Self Harm: denied  Family Psych History: unknown Family Hx suicide: unknown  Social History:  Living Situation: was living with [name] and her husband  Substance History Drug use: Cocaine, Amphetamines, THC   Exam Findings   Psychiatric Specialty Exam:  Presentation  General Appearance: Bizarre  Eye Contact:Good  Speech:Clear and Coherent  Speech Volume:Normal  Handedness:Right   Mood and Affect  Mood:Euphoric  Affect:Congruent   Thought Process  Thought Processes:Coherent  Descriptions of Associations:Intact  Orientation:Full (Time, Place and Person)  Thought Content:Illogical  Hallucinations:Hallucinations: None  Ideas of Reference:None  Suicidal Thoughts:Suicidal Thoughts: No  Homicidal Thoughts:Homicidal Thoughts: No   Sensorium  Memory:Immediate Good; Recent Fair; Remote Good  Judgment:Poor  Insight:Poor   Executive Functions  Concentration:Good  Attention Span:Good  Recall:Good  Fund of  Knowledge:Poor  Language:Good   Psychomotor Activity  Psychomotor Activity:Psychomotor Activity: Normal   Assets  Assets:Communication Skills; Desire for Improvement   Sleep  Sleep:Sleep: Good    Physical Exam: Vital signs:  Temp:  [97.6 F (36.4 C)-98.2 F (36.8 C)] 97.6 F (36.4 C) (08/10 0920) Pulse Rate:  [93-95] 93 (08/09 2212) Resp:  [18] 18 (08/10 0920) BP: (98-106)/(62-78) 106/78 (08/10 0920) SpO2:  [97 %-100 %] 100 % (08/10 0920) Physical Exam Vitals and nursing note reviewed.  Constitutional:      Appearance: He is obese.  HENT:     Head: Normocephalic and atraumatic.  Eyes:     Conjunctiva/sclera: Conjunctivae normal.  Pulmonary:     Effort: Pulmonary effort is normal.  Skin:    General: Skin is warm and dry.  Neurological:     Mental Status: He is alert and oriented to person, place, and time.  Psychiatric:        Mood and Affect: Mood is anxious. Affect is labile and angry.        Speech: Speech is rapid and pressured.        Behavior: Behavior is uncooperative, agitated and aggressive.        Thought Content: Thought content is paranoid and delusional.        Cognition and Memory: Cognition is impaired.        Judgment: Judgment is impulsive.     Blood pressure 106/78, pulse 93, temperature 97.6 F (36.4 C), temperature source Oral, resp. rate 18, height 5\' 8"  (1.727 m), weight 113.4 kg, SpO2 100%. Body mass index is 38.01 kg/m.   Other History   These have been pulled in through the EMR, reviewed, and updated if appropriate.   Family History:  The patient's family history includes Heart disease in his mother; Hypertension in an other family member.  Medical History: Past Medical History:  Diagnosis Date   CHF (congestive heart failure) (HCC)    Depression    Diabetes mellitus    Hypertension    Myocardial infarction (HCC)    Sleep apnea    Sleep apnea    Tachycardia     Surgical History: Past Surgical History:  Procedure  Laterality Date   CHOLECYSTECTOMY     knee  3 knee surgeries    Medications:   Current Facility-Administered Medications:    acetaminophen (TYLENOL) tablet 1,000 mg, 1,000 mg, Oral, Q6H PRN, 1,000 mg at 09/08/22 1133 **OR** acetaminophen (TYLENOL) suppository 650 mg, 650 mg, Rectal, Q6H PRN, Shitarev, Dimitry, MD   atorvastatin (LIPITOR) tablet 80 mg, 80 mg, Oral, Daily, Shelby Mattocks, DO, 80 mg at 09/09/22 5643   busPIRone (BUSPAR) tablet 10 mg, 10 mg, Oral, TID, [name], MD, 10 mg at 09/09/22 3295   camphor-menthol (SARNA) lotion, , Topical, PRN, Shelby Mattocks, DO   divalproex (DEPAKOTE) DR tablet 500 mg, 500 mg, Oral, Q12H, Wise, Byrd Hesselbach, MD, 500 mg at 09/09/22 0922   enoxaparin (LOVENOX) injection 50 mg, 50 mg, Subcutaneous, Daily, Shitarev, Dimitry, MD, 50 mg at 09/09/22 0924   FLUoxetine (PROZAC) capsule 20 mg, 20 mg, Oral, Daily, [name], MD, 20 mg at 09/09/22 [id number]  fluticasone (FLONASE) 50 MCG/ACT nasal spray 2 spray, 2 spray, Each Nare, Daily, Darnelle Spangle B, MD, 2 spray at 09/07/22 1030   gabapentin (NEURONTIN) capsule 600 mg, 600 mg, Oral, TID, Shitarev, Dimitry, MD, 600 mg at 09/09/22 8315   Gerhardt's butt cream, , Topical, BID, Carney Living, MD, Given at 09/09/22 1761   hydrOXYzine (ATARAX) tablet 25 mg, 25 mg, Oral, TID PRN, [name], MD, 25 mg at 09/09/22 0921   lidocaine (LIDODERM) 5 % 1 patch, 1 patch, Transdermal, Daily, Hindel, Leah, MD, 1 patch at 09/09/22 0657   melatonin tablet 5 mg, 5 mg, Oral, QHS PRN, [name], MD, 5 mg at 09/08/22 2028   metoprolol tartrate (LOPRESSOR) tablet 25 mg, 25 mg, Oral, BID, Alicia Amel, MD, 25 mg at 09/09/22 6073   midazolam (VERSED) injection 2 mg, 2 mg, Intravenous, PRN, Alicia Amel, MD   midazolam (VERSED) injection 2 mg, 2 mg, Intravenous, Once, de Saintclair Halsted, Cortney E, NP   QUEtiapine (SEROQUEL) tablet  25 mg, 25 mg, Oral, BID PRN, 25 mg at 09/09/22 0213 **OR** OLANZapine (ZYPREXA) injection 2.5 mg, 2.5 mg, Intramuscular, BID PRN, [name], MD   pantoprazole (PROTONIX) EC tablet 40 mg, 40 mg, Oral, Daily, Darnelle Spangle B, MD, 40 mg at 09/09/22 0920   [START ON 09/21/2022] penicillin g benzathine (BICILLIN LA) 1200000 UNIT/2ML injection 2.4 Million Units, 2.4 Million Units, Intramuscular, Once, Odette Fraction, MD   penicillin G potassium 12 Million Units in dextrose 5 % 500 mL CONTINUOUS infusion, 12 Million Units, Intravenous, Q12H, Odette Fraction, MD, Last Rate: 41.7 mL/hr at 09/09/22 0335, 12 Million Units at 09/09/22 0335   QUEtiapine (SEROQUEL) tablet 250 mg, 250 mg, Oral, QHS, [name], MD, 250 mg at 09/08/22 2027   tamsulosin (FLOMAX) capsule 0.4 mg, 0.4 mg, Oral, Daily, Alicia Amel, MD, 0.4 mg at 09/09/22 7106  Allergies: Allergies  Allergen Reactions   Aleve [Naproxen Sodium] Anaphylaxis   Lisinopril Other (See Comments)    COUGH    Metformin And Related Diarrhea

## 2022-09-10 DIAGNOSIS — F19929 Other psychoactive substance use, unspecified with intoxication, unspecified: Secondary | ICD-10-CM | POA: Diagnosis not present

## 2022-09-10 DIAGNOSIS — F1994 Other psychoactive substance use, unspecified with psychoactive substance-induced mood disorder: Secondary | ICD-10-CM | POA: Diagnosis not present

## 2022-09-10 DIAGNOSIS — A523 Neurosyphilis, unspecified: Secondary | ICD-10-CM | POA: Diagnosis not present

## 2022-09-10 MED ORDER — DIVALPROEX SODIUM 250 MG PO DR TAB
1000.0000 mg | DELAYED_RELEASE_TABLET | Freq: Two times a day (BID) | ORAL | Status: DC
  Administered 2022-09-10 – 2022-09-14 (×8): 1000 mg via ORAL
  Filled 2022-09-10 (×2): qty 4
  Filled 2022-09-10: qty 2
  Filled 2022-09-10 (×4): qty 4
  Filled 2022-09-10 (×2): qty 2

## 2022-09-10 MED ORDER — COLCHICINE 0.3 MG HALF TABLET
0.3000 mg | ORAL_TABLET | Freq: Two times a day (BID) | ORAL | Status: AC
  Administered 2022-09-10 – 2022-09-12 (×5): 0.3 mg via ORAL
  Filled 2022-09-10 (×7): qty 1

## 2022-09-10 NOTE — Assessment & Plan Note (Addendum)
Improving.  Possibly secondary to inadequate hygiene. - Gerhardt's butt cream in use - Sarna lotion as needed - Ensure adequately cleaned after bowel movements

## 2022-09-10 NOTE — Assessment & Plan Note (Signed)
Patient with UDS positive for all substances except opiates on admission (benzos iatrogenic).  Does have history of substance use and on multiple centrally acting meds.  Suspect polypharmacy contributed to his presentation. - Limit centrally acting meds

## 2022-09-10 NOTE — Progress Notes (Signed)
Daily Progress Note Intern Pager: [phone]  Patient name: Cody Nolan Medical record number: [id number] Date of birth: [birth year] Age: 62 y.o. Gender: male  Primary Care Provider: Lockie Mola, MD Consultants: Infectious Disease, Psychiatry Code Status: FULL  Pt Overview and Major Events to Date:  8/4 - Admitted, confused 8/5 - Mental status improving 8/6 - Treponemal antibodies reactive.  ID consulted, LP planned. 8/7 - No LP per neurology, unable to obtain consent; started empiric Penicillin G IV 8/8 - Now hypomanic, further improved AMS 8/9 - More upset because he is IVC, continuing IVC 8/11 - Mental status improving, still hypomanic and upset concerning IVC   Assessment and Plan: Cody Nolan is a 62 y.o. male with a pertinent PMH of diabetes, HTN, CHF, and prior MI who presented with altered mentation and ?seizure-like activity when brought in by police from the community and was admitted for substance-induced psychosis and AKI, currently IVCed and being treated empirically for neurosyphilis.  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   Treating empirically. - ID on board, appreciate recs - Empiric Pen G 24 MU continuous daily for 14 days (8/8-8/22) - Benzathine Pen G 2.4 MU x1 after completion of IV course  - Will need follow up with non-treponemal test in 6 months, 12 months, 24  months        Controlled diabetes mellitus type 2 with complications (HCC)     A1c 6.8% one year ago, now 6.4 this admission. Taking 22 units of  Tresiba BID at home.  Patient still refusing CBGs inpatient at this time. - CBGs Q4h, add insulin if patient agrees to checks        AMS (altered mental status)     Continues to improve, less agitated, but becomes more upset when  discussing IVC status. Ongoing paranoia surrounding medications while  inpatient and frustration with IVC status.   Differential still includes  drug-induced bipolar disorder/psychosis versus neurosyphilis. - Continue IVC due to concern for labile mental status and behavior - May transfer to IP psych when patient completes syphilis 2 week  treatment course, but will interface with psych team later - Seizure precautions, PRN Versed for seizures - Syphilis treatment as per primary problem - Appreciate psych consult, psychiatric medications managed per psych: -- Increase depakote to 1000 mg BID for mania             --Increase Depakote pending Depakote level -- Continue 250 mg seroquel at bedtime, 25 mg BID PRN agitation  -- Continue buspirone 10 mg TID for anxiety -- Continue fluoxetine 20 mg daily for depression -- Continue hydroxyzine 25 mg TID PRN for anxiety -- Continue Backup PRN geodon 2.5 mg PRN if pt refuses PO        Chronic pain     Reports pain this morning and frustration with not receiving more pain  medications, although he understands that tramadol and other pain  medications could interact with his other meds. XR of back in May 2024  with degenerative changes. Reassured by good mobility, unable to evaluate  pain today. - Sarna lotion to affected areas for now - Continue gabapentin home dose 600 mg TID - Lidocaine patch daily - Add colchicine 0.3 mg in lieu of NSAIDs as patient is allergic        Irritation of skin of perianal region  Irritation evident on previous exam. Possibly secondary to inadequate  hygiene. - Desitin ointment with each bowel movement - Sarna lotion as needed - Ensure adequately cleaned after bowel movements        Substance or medication-induced bipolar and related disorder with onset  during intoxication [facility])     Patient with UDS positive for all substances except opiates on  admission (benzos iatrogenic).  Does have history of substance use and on  multiple centrally acting meds.  Suspect polypharmacy contributed to his  presentation.         Positive RPR test    FEN/GI: Regular, no mIVF PPx: Lovenox Dispo:  Inpatient psych vs home, depends on psych progression after neurosyphilis Tx finished.  Barriers include 2 weeks of IV penicillin until 8/22.  Subjective:  This morning, patient is more calm than he was on Friday concerning IVC situation.  Alternates between happy and welcoming and irritable.  However, he becomes increasingly frustrated when discussing IVC and perseverates on this aspect of his hospitalization even when redirected.  Continues to maintain that he does not need to be IVC as he "we will stay to figure out what is wrong with him."  He requests a copy of the physical IVC order.  Expresses frustration with medical staff who he believes were laughing at him.  Also expressed frustration with his food not coming with a fork, which he attributes to suicide precautions (of note, plastic fork found lying next to plate).  States that if IVC was not lifted by tomorrow, and there will be "legal repercussions."  Reassured patient that we are trying to help and will bring a copy of his IVC.  Objective: Temp:  [98.2 F (36.8 C)] 98.2 F (36.8 C) (08/11 0723) Pulse Rate:  [95-96] 96 (08/11 0723) Resp:  [20] 20 (08/10 2029) BP: (107-144)/(72-125) 107/72 (08/11 0723) SpO2:  [100 %] 100 % (08/10 2029)  Physical Exam: General: Sitting in bed, NAD, alert, mildly agitated. Cardiovascular: Regular rate and rhythm. Normal S1/S2. No murmurs, rubs, or gallops appreciated. 2+ radial pulses. Pulmonary: Clear bilaterally to ascultation. No increased WOB, no accessory muscle usage on room air. No wheezes, rales, or crackles. Skin: Warm and dry.  Still has perineal rash, but improving. Extremities: No peripheral edema bilaterally. Psych: Speech mildly pressured, still not fully redirectible, with tangential speech. Continues to have paranoid ideation about medication diversion.  Has insight into his condition, but not fully grasping situation  and judgement remains somewhat impaired.  No suicidal or homicidal ideation.  Laboratory: Most recent CBC Lab Results  Component Value Date   WBC 10.8 (H) 09/07/2022   HGB 10.9 (L) 09/07/2022   HCT 33.7 (L) 09/07/2022   MCV 88.9 09/07/2022   PLT 321 09/07/2022   Most recent BMP    Latest Ref Rng & Units 09/07/2022   12:01 AM  BMP  Glucose 70 - 99 mg/dL 657   BUN 8 - 23 mg/dL 9   Creatinine 8.46 - 9.62 mg/dL 9.52   Sodium 841 - 324 mmol/L 139   Potassium 3.5 - 5.1 mmol/L 3.6   Chloride 98 - 111 mmol/L 107   CO2 22 - 32 mmol/L 22   Calcium 8.9 - 10.3 mg/dL 8.7     Other pertinent labs: - None  New Imaging/Diagnostic Tests: - None  Seyed Heffley, MD 09/10/2022, 3:18 PM [facility]  FMTS Intern pager: [phone], text pages welcome Secure chat group [facility] Teaching Service

## 2022-09-10 NOTE — Consult Note (Signed)
[name] Consult Evaluation  Service Date: September 10, 2022 LOS:  LOS: 7 days    Primary Psychiatric Diagnoses  Substance induced bipolar disorder   Assessment  Cody Nolan is a 62 y.o. male admitted medically for 09/02/2022  7:57 PM for ?seizure vs stroke. He carries the psychiatric diagnoses of depression from PCP and has a past medical history of  CHF, diabetes, HTN, MI, sleep apnea. Psychiatry was consulted for Paranoid and tangential, no documented history of psychosis  by Dr. Marisue Humble.  His current presentation of pressured, tangential speech, grandiosity, lack of sleep, is most consistent with a diagnosis of mania; given recent substance use and absence of formal bipolar diagnosis this is most likely substance induced mood disorder. His diagnosis of neurosyphillis is a complicating factor, but one that might allow him to avoid hospitalization in the inpatient psychiatric hospital if he can be stabilized enough prior to his medical discharge. He is currently amenable to inpatient medical treatment for the syphilis, but not inpatient psychiatric treatment after he is medically stable. This is a sore topic, so avoid discussing it with him if possible.  Given his alleged bizarre behavior, threats to family, etc he meets criteria for inpt psychiatric hospitalization under involuntary commitment. Pt currently denies any cooperation with regards to inpatient psychiatric hospitalization. Current outpatient psychotropic medications include prozac and buspirone and historically he has had a unknown response to these medications. He was semi compliant with medications prior to admission as evidenced by fill hx. Patient is showing good response to the combination of increased seroquel at night and depakote.  Refrain from taking large groups into his room.  Patient will need a valproic acid level on 8/10 (order place) in the evening to assess for trough level. If it comes back low-therapeutic,  he would benefit from an increased dose.   Patient feels that many aspects of his life are out of control, and giving him as much ownership over his medical decision-making as possible will go a long way to winning his cooperation. (PRN medication of seroquel, hydroxyzine).    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 Active Problems:   Controlled diabetes mellitus type 2 with complications (HCC)   AMS (altered mental status)   Chronic pain   Irritation of skin of perianal region   Substance or medication-induced bipolar and related disorder with onset during intoxication (HCC)   Positive RPR test     Plan   ## Psychiatric Medication Recommendations:  -- Increase depakote 1000 mg BID for mania  --Increase depakote level pending Depakote Level -- Continue 250 mg seroquel at bedtime, 25 mg BID PRN agitation  -- Continue buspirone 10 mg TID for anxiety -- Continue fluoxetine 20 mg daily for depression -- Continue hydroxyzine 25 mg TID PRN for anxiety -- Continue Backup PRN geodon 2.5 mg PRN if pt refuses PO  ## Medical Decision Making Capacity:  -- Patient demonstrates clear understanding of costs, risks, and benefits of various medications. Patient appears to have capacity for most minor decisions, but is still too manic to leave on his own.  ## Further Work-up:  -- agree with thorough w/u from primary and neuro teams -- UDS showed positive for benzodiazepines (iatrogenic), amphetamines, cocaine, THC  -- most recent EKG on 8/3 had QtC of 437 in setting of sinus tach -- Pertinent labwork reviewed earlier this admission includes: elevated CK (1003 on 8/4), HgbA1c 6.4, Reactive RPR (titer 1:16), proteinuria, hypocalcemia  and hypokalemia.  ##  Disposition:  -- We recommend inpatient psychiatric hospitalization after medical hospitalization. Patient has been involuntarily committed on 09/04/2022.   ## Behavioral / Environmental:  --  Utilize  compassion and acknowledge the patient's experiences while setting clear and realistic expectations for care.  ## Safety and Observation Level:  - Based on my clinical evaluation, I estimate the patient to be at low risk of self harm in the current setting  - At this time, we recommend a 1:1 level of observation due to our concern of his acute mental status.   Thank you for this consult request. Recommendations have been communicated to the primary team. We will continue to follow at this time.   Harlin Heys, DO  Psychiatric and Social History   Relevant Aspects of Hospital Course:  Admitted on 09/02/2022 for agitation and bizarre behavior after a witnessed seizure.   Patient Report:  8/9: Met patient this morning at bedside. Patient was in a much better mood than yesterday. He is less manic, paranoid, his speech is less pressured, less irritable and labile. Pt still has concerns about large groups of people.   Patient remains difficult to interrupt, but he is trying harder to listen when it is not his turn. Discussed going up on the depakote or the seroquel with him, he was willing to let us make adjustments to nighttime meds on either. Upon further review, we will recommend going up on the seroquel to 250mg  nightly before we adjust the depakote. Need to get a blood level of that before we adjust it.  Throughout the course of the interview, he denied suicidal thoughts and homicidal thoughts. He was not responding to internal stimuli.  Pt remains paranoid, even though he invited our team in, he grew  agitated when the larger psychiatric team of three was present. Took approximately 10 minutes to calm him down and re-earn his trust. Refrain from taking large groups into his room.  8/10 Patient seen laying in bed this afternoon on my approach accompanied by sitter at bedside. The patient reports that he is doing better with his current medication regimen and he feels calmer than he has  been in the past. The patient is focused on having his IVC removed and does not believe that he needs to be admitted to a psychiatric facility in the future. He is amenable to having his medication adjusted. He admits to having some concerns about his therapeutic alliance with the psychiatric team but he is becoming more trusting due to the results he is getting from the medication changes. He denies any SI/HI/AVH.  8/11 Patient sen laying in bed this afternoon accompanied by sitter at bedside. He reports frustration with being on IVC and feels like the psychiatric team has not respected him. 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 The patient also reports his need for a proper diagnosis and wanting answers to his OCD, Anxiety, and potential Bipolar Disorder. He mentions multiple times that he does not want to hurt himself or anyone else and that the IVC is a violation of his rights. He has been compliant with his medication and he is amenable to increasing his dose of Depakote.  Psych ROS:  Negative for poor sleep, lability, tangentiality (now circumstantial). Speech still borderline pressured.   Collateral information:  Pt offered the phone number of his attorney   Psychiatric History:  Information collected from pt, medical record  Prev Dx/Sx: ??? Depression and anxiety from prescriptions, potentially PTSD Current Psych Provider: PCP Home Meds (current): prozac, buspirone Previous Med Trials: unknown Therapy: unknown  Prior ECT: unknown Prior Psych Hospitalization: unknown  Prior Self Harm: denied  Family Psych History: unknown Family Hx suicide: unknown  Social History:  Living Situation: was living with [name] and her husband  Substance History Drug use: Cocaine, Amphetamines, THC   Exam Findings   Psychiatric Specialty Exam:  Presentation  General Appearance:  Appropriate for Environment  Eye Contact:Good  Speech:Pressured  Speech Volume:Normal  Handedness:Right   Mood and Affect  Mood:Irritable  Affect:Congruent   Thought Process  Thought Processes:Disorganized  Descriptions of Associations:Tangential  Orientation:Full (Time, Place and Person)  Thought Content:Illogical  Hallucinations:Hallucinations: None  Ideas of Reference:None  Suicidal Thoughts:Suicidal Thoughts: No  Homicidal Thoughts:Homicidal Thoughts: No   Sensorium  Memory:Immediate Good; Recent Fair; Remote Good  Judgment:Poor  Insight:Poor   Executive Functions  Concentration:Good  Attention Span:Good  Recall:Good  Fund of Knowledge:Fair  Language:Good   Psychomotor Activity  Psychomotor Activity:No data recorded   Assets  Assets:Communication Skills; Desire for Improvement   Sleep  Sleep:Sleep: Good    Physical Exam: Vital signs:  Temp:  [98.2 F (36.8 C)] 98.2 F (36.8 C) (08/11 0723) Pulse Rate:  [95-96] 96 (08/11 0723) Resp:  [20] 20 (08/10 2029) BP: (107-144)/(72-125) 107/72 (08/11 0723) SpO2:  [100 %] 100 % (08/10 2029) Physical Exam Vitals and nursing note reviewed.  Constitutional:      Appearance: He is obese.  HENT:     Head: Normocephalic and atraumatic.  Eyes:     Conjunctiva/sclera: Conjunctivae normal.  Pulmonary:     Effort: Pulmonary effort is normal.  Skin:    General: Skin is warm and dry.  Neurological:     Mental Status: He is alert and oriented to person, place, and time.  Psychiatric:        Mood and Affect: Mood is anxious. Affect is labile and angry.        Speech: Speech is rapid and pressured.        Behavior: Behavior is uncooperative, agitated and aggressive.        Thought Content: Thought content is paranoid and delusional.        Cognition and Memory: Cognition is impaired.        Judgment: Judgment is impulsive.     Blood pressure 107/72, pulse 96, temperature 98.2 F (36.8 C),  temperature source Oral, resp. rate 20, height 5\' 8"  (1.727 m), weight 113.4 kg, SpO2 100%. Body mass index is 38.01 kg/m.   Other History   These have been pulled in through the EMR, reviewed, and updated if appropriate.   Family History:  The patient's family history includes Heart disease in his mother; Hypertension in an other family member.  Medical History: Past Medical History:  Diagnosis Date   CHF (congestive heart failure) (HCC)    Depression    Diabetes mellitus    Hypertension    Myocardial infarction [facility])    Sleep apnea    Sleep apnea    Tachycardia     Surgical History: Past Surgical History:  Procedure Laterality Date   CHOLECYSTECTOMY     knee  3 knee surgeries    Medications:   Current Facility-Administered Medications:    acetaminophen (TYLENOL) tablet 1,000 mg, 1,000 mg, Oral, Q6H PRN, 1,000 mg at 09/09/22 2025 **OR** acetaminophen (TYLENOL) suppository 650 mg, 650 mg, Rectal, Q6H PRN, [name], Dimitry, MD  atorvastatin (LIPITOR) tablet 80 mg, 80 mg, Oral, Daily, Shelby Mattocks, DO, 80 mg at 09/10/22 0914   busPIRone (BUSPAR) tablet 10 mg, 10 mg, Oral, TID, [name], MD, 10 mg at 09/10/22 0914   camphor-menthol (SARNA) lotion, , Topical, PRN, Shelby Mattocks, DO   colchicine tablet 0.3 mg, 0.3 mg, Oral, BID, Nestor Ramp, MD   divalproex (DEPAKOTE) DR tablet 1,000 mg, 1,000 mg, Oral, Q12H, Malyiah Fellows L, DO   enoxaparin (LOVENOX) injection 50 mg, 50 mg, Subcutaneous, Daily, Shitarev, Dimitry, MD, 50 mg at 09/10/22 0915   FLUoxetine (PROZAC) capsule 20 mg, 20 mg, Oral, Daily, [name], MD, 20 mg at 09/10/22 0914   fluticasone (FLONASE) 50 MCG/ACT nasal spray 2 spray, 2 spray, Each Nare, Daily, Alicia Amel, MD, 2 spray at 09/07/22 1030   gabapentin (NEURONTIN) capsule 600 mg, 600 mg, Oral, TID, Shitarev, Dimitry, MD, 600 mg at 09/10/22 6578   Gerhardt's butt cream, , Topical, BID, Carney Living, MD, Given at 09/10/22 0915   hydrOXYzine (ATARAX) tablet 25 mg, 25 mg, Oral, TID PRN, [name], MD, 25 mg at 09/09/22 2025   lidocaine (LIDODERM) 5 % 1 patch, 1 patch, Transdermal, Daily, Hindel, Leah, MD, 1 patch at 09/10/22 0913   melatonin tablet 5 mg, 5 mg, Oral, QHS PRN, [name], MD, 5 mg at 09/09/22 2026   metoprolol tartrate (LOPRESSOR) tablet 25 mg, 25 mg, Oral, BID, Alicia Amel, MD, 25 mg at 09/10/22 0914   midazolam (VERSED) injection 2 mg, 2 mg, Intravenous, PRN, Alicia Amel, MD   midazolam (VERSED) injection 2 mg, 2 mg, Intravenous, Once, de Saintclair Halsted, Cortney E, NP   QUEtiapine (SEROQUEL) tablet 25 mg, 25 mg, Oral, BID PRN, 25 mg at 09/10/22 0936 **OR** OLANZapine (ZYPREXA) injection 2.5 mg, 2.5 mg, Intramuscular, BID PRN, [name], MD   pantoprazole (PROTONIX) EC tablet 40 mg, 40 mg, Oral, Daily, Darnelle Spangle B, MD, 40 mg at 09/10/22 0915   [START ON 09/21/2022] penicillin g benzathine (BICILLIN LA) 1200000 UNIT/2ML injection 2.4 Million Units, 2.4 Million Units, Intramuscular, Once, Odette Fraction, MD   penicillin G potassium 12 Million Units in dextrose 5 % 500 mL CONTINUOUS infusion, 12 Million Units, Intravenous, Q12H, Manandhar, Rozell Searing, MD, Last Rate: 41.7 mL/hr at 09/10/22 0400, 12 Million Units at 09/10/22 0400   QUEtiapine (SEROQUEL) tablet 250 mg, 250 mg, Oral, QHS, Wise, Byrd Hesselbach, MD, 250 mg at 09/09/22 2025   tamsulosin (FLOMAX) capsule 0.4 mg, 0.4 mg, Oral, Daily, Alicia Amel, MD, 0.4 mg at 09/10/22 0915  Allergies: Allergies  Allergen Reactions   Aleve [Naproxen Sodium] Anaphylaxis   Lisinopril Other (See Comments)    COUGH    Metformin And Related Diarrhea

## 2022-09-10 NOTE — Plan of Care (Signed)
  Problem: Education: Goal: Expressions of having a comfortable level of knowledge regarding the disease process will increase Outcome: Progressing   Problem: Education: Goal: Knowledge of General Education information will improve Description: Including pain rating scale, medication(s)/side effects and non-pharmacologic comfort measures Outcome: Progressing

## 2022-09-10 NOTE — Assessment & Plan Note (Addendum)
Significant degenerative changes of L hip.  Still reporting back pain and hip pain, agrees that it will be difficult to treat pain and wants hip surgery outpatient. - Sarna lotion to affected areas - Continue gabapentin home dose 600 mg TID - Lidocaine patch daily - Stop colchicine as not helping - Tramadol increased to 100 mg BID PRN - Tizanidine 4 mg BID

## 2022-09-10 NOTE — Assessment & Plan Note (Addendum)
RPR positive on admission, confirmed with treponemal antibody test.  Treating empirically. - ID on board, appreciate recs - Empiric Pen G 24 MU continuous daily for 14 days (8/8-8/22) - Benzathine Pen G 2.4 MU x1 after completion of IV course  - Will need follow up with non-treponemal test in 6 months, 12 months, 24 months

## 2022-09-10 NOTE — Assessment & Plan Note (Addendum)
A1c 6.8% one year ago, now 6.4 this admission. Patient maintains his T2DM is resolved, but fasting AM BMP is elevated to 230 today, patient still refusing CBGs at this time. - CBGs Q4h ordered, but patient refusing - Start Jardiance 10 mg daily

## 2022-09-10 NOTE — Assessment & Plan Note (Signed)
Continues to improve, less agitated, insight also better.  Paranoia concerning meds improved.  Differential still includes drug-induced bipolar disorder/psychosis versus neurosyphilis. - IVC expired, not renewed given improving insight and with Psych agreement - May transfer to IP psych when patient completes syphilis 2 week treatment course, but will interface with psych team - Syphilis treatment as per primary problem - Appreciate psych consult, psychiatric medications managed per psych: -- Increase depakote to 1000 mg BID for mania             --Increase Depakote pending Depakote level -- Continue 250 mg seroquel at bedtime, 25 mg BID PRN agitation  -- Continue buspirone 10 mg TID for anxiety -- Continue fluoxetine 20 mg daily for depression -- Continue hydroxyzine 25 mg TID PRN for anxiety -- Continue Backup PRN geodon 2.5 mg PRN if pt refuses PO

## 2022-09-11 DIAGNOSIS — A523 Neurosyphilis, unspecified: Secondary | ICD-10-CM | POA: Diagnosis not present

## 2022-09-11 DIAGNOSIS — F19929 Other psychoactive substance use, unspecified with intoxication, unspecified: Secondary | ICD-10-CM | POA: Diagnosis not present

## 2022-09-11 DIAGNOSIS — F1994 Other psychoactive substance use, unspecified with psychoactive substance-induced mood disorder: Secondary | ICD-10-CM | POA: Diagnosis not present

## 2022-09-11 NOTE — Consult Note (Signed)
Cody Nolan Psychiatry Consult Evaluation  Service Date: September 11, 2022 LOS:  LOS: 8 days    Primary Psychiatric Diagnoses  Substance induced bipolar disorder   Assessment  Cody Nolan is a 62 y.o. male admitted medically for 09/02/2022  7:57 PM for altered mental status in the setting of . He carries the psychiatric diagnoses of depression from PCP and has a past medical history of  CHF, diabetes, HTN, MI, sleep apnea. Psychiatry was consulted for Paranoid and tangential, no documented history of psychosis  by Dr. Marisue Humble.  His current presentation of pressured, tangential speech, grandiosity, lack of sleep, is most consistent with a diagnosis of mania; given recent substance use and absence of formal bipolar diagnosis this is most likely substance induced mood disorder.   At this time, the psychiatry team feels Cody Nolan is making good progress and is less manic than at presentation. He is markedly less paranoid, grandiose, and pressured. He is showing many signs of improvement. As long as patient stays in hospital to complete his neurosyphilis treatment regimen, there is no reason to hospitalize him at the behavioral health hospital.  Previous outpatient psychotropic medications include prozac and buspirone and historically he has had a unknown response to these medications. He was semi compliant with medications prior to admission as evidenced by fill hx. Patient is showing good response to the combination of increased seroquel at night and depakote.    Patient will need a valproic acid level on 8/17 (order placed) in the evening to assess for trough level. If it comes back low-therapeutic, he would benefit from an increased dose.   Patient feels that many aspects of his life are out of control, and giving him as much ownership over his medical decision-making as possible will go a long way to winning his cooperation. (PRN medication of seroquel, hydroxyzine).   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 Active Problems:   Controlled diabetes mellitus type 2 with complications (HCC)   AMS (altered mental status)   Substance or medication-induced bipolar and related disorder with onset during intoxication (HCC)   Chronic pain   Irritation of skin of perianal region   Positive RPR test     Plan   ## Psychiatric Medication Recommendations:  -- Continue depakote 1000 mg BID for mania  --Placed order for depakote level 5 days from increase (lab draw on 8/17 PM) -- Continue 250 mg seroquel at bedtime, 25 mg BID PRN agitation  -- Continue buspirone 10 mg TID for anxiety -- Continue fluoxetine 20 mg daily for depression -- Continue hydroxyzine 25 mg TID PRN for anxiety -- Continue Backup PRN geodon 2.5 mg PRN if pt refuses PO  ## Medical Decision Making Capacity:  -- Patient demonstrates clear understanding of costs, risks, and benefits of various medications. Patient appears to have capacity for most decisions.   ## Further Work-up:  -- agree with thorough w/u from primary and neuro teams -- UDS showed positive for benzodiazepines (iatrogenic), amphetamines, cocaine, THC  -- most recent EKG on 8/3 had QtC of 437 in setting of sinus tach -- Pertinent labwork reviewed earlier this admission includes: elevated CK (1003 on 8/4), HgbA1c 6.4, Reactive RPR (titer 1:16), proteinuria, hypocalcemia  and hypokalemia.  ## Disposition:  -- We recommend inpatient psychiatric hospitalization after medical hospitalization. Patient is under voluntary admission status at this time; please IVC if attempts to leave hospital.  ## Behavioral / Environmental:  --  Utilize compassion and acknowledge  the patient's experiences while setting clear and realistic expectations for care.  ## Safety and Observation Level:  - Based on my clinical evaluation, I estimate the patient to be at low risk of self harm in the current setting  - At this time,  we recommend discontinuing the 1:1 and changing him to q15 minute checks.   Thank you for this consult request. Recommendations have been communicated to the primary team. We will continue to follow at this time.   [name], MD  Psychiatric and Social History   Relevant Aspects of Hospital Course:  Admitted on 09/02/2022 for agitation and bizarre behavior after a witnessed seizure.   Patient Report:  8/12: Met patient this morning at bedside with [employer] medical student Cody Nolan and attending Dr Gasper Sells. Patient was more stable, less paranoid, and less pressured than before the weekend.  Patient is still very talkative, but he is doing a better job recognizing and stopping himself when it is not his turn. Discussed going up on the depakote or the seroquel with him. Depakote level collected over the weekend, found to be sub-therapeutic at 34, depakote dosage was increased to 1000 mg BID. Continuing seroquel at 250 mg nightly + 25 mg prns.  Throughout the course of the interview, he denied suicidal thoughts and homicidal thoughts. He was not responding to internal stimuli.  Pt is less paranoid and did not reflexively stop talking due to the additional people in the room. Pt is still complaining of acute L hip pain. Pain is 9/10 and continuous. Discussed with family medicine, they are currently giving him colchicine to help with the bone on bone pain.    Psych ROS:  Negative for poor sleep, lability, tangentiality (now circumstantial). Speech still fast, but no longer pressured.   Collateral information:  Pt offered the phone number of his attorney   Psychiatric History:  Information collected from pt, medical record  Prev Dx/Sx: ??? Depression and anxiety from prescriptions, potentially PTSD Current Psych Provider: PCP Home Meds (current): prozac, buspirone Previous Med Trials: unknown Therapy: unknown  Prior ECT: unknown Prior Psych Hospitalization: unknown   Prior Self Harm: denied  Family Psych History: unknown Family Hx suicide: unknown  Social History:  Living Situation: was living with [name] and her husband  Substance History Drug use: Cocaine, Amphetamines, THC   Exam Findings   Psychiatric Specialty Exam:  Presentation  General Appearance: Casual  Eye Contact:Good  Speech:Normal Rate; Clear and Coherent  Speech Volume:Normal  Handedness:Right   Mood and Affect  Mood:Euthymic  Affect:Congruent   Thought Process  Thought Processes:Coherent; Linear  Descriptions of Associations:Tangential  Orientation:Full (Time, Place and Person)  Thought Content:Logical  Hallucinations:Hallucinations: None  Ideas of Reference:None  Suicidal Thoughts:Suicidal Thoughts: No  Homicidal Thoughts:Homicidal Thoughts: No   Sensorium  Memory:Immediate Good; Recent Good; Remote Good  Judgment:Fair  Insight:Fair   Executive Functions  Concentration:Good  Attention Span:Fair  Recall:Good  Fund of Knowledge:Good  Language:Good   Psychomotor Activity  Psychomotor Activity:Psychomotor Activity: Normal    Assets  Assets:Communication Skills; Desire for Improvement   Sleep  Sleep:Sleep: Fair     Physical Exam: Vital signs:  Temp:  [97.6 F (36.4 C)-98.4 F (36.9 C)] 98.3 F (36.8 C) (08/12 1350) Pulse Rate:  [89-98] 89 (08/12 1350) Resp:  [17-18] 17 (08/12 1350) BP: (97-116)/(61-78) 109/78 (08/12 1350) SpO2:  [95 %-100 %] 95 % (08/12 1350) Physical Exam Vitals and nursing note reviewed.  Constitutional:      Appearance: He  is obese.  HENT:     Head: Normocephalic and atraumatic.  Eyes:     Conjunctiva/sclera: Conjunctivae normal.  Pulmonary:     Effort: Pulmonary effort is normal.  Musculoskeletal:        General: Tenderness present.  Skin:    General: Skin is warm and dry.  Neurological:     Mental Status: He is alert and oriented to person, place, and time.  Psychiatric:         Mood and Affect: Mood is anxious. Affect is labile and angry.        Speech: Speech is rapid and pressured.        Behavior: Behavior is uncooperative, agitated and aggressive.        Thought Content: Thought content is paranoid and delusional.        Cognition and Memory: Cognition is impaired.        Judgment: Judgment is impulsive.     Blood pressure 109/78, pulse 89, temperature 98.3 F (36.8 C), temperature source Oral, resp. rate 17, height 5\' 8"  (1.727 m), weight 113.4 kg, SpO2 95%. Body mass index is 38.01 kg/m.   Other History   These have been pulled in through the EMR, reviewed, and updated if appropriate.   Family History:  The patient's family history includes Heart disease in his mother; Hypertension in an other family member.  Medical History: Past Medical History:  Diagnosis Date   CHF (congestive heart failure) (HCC)    Depression    Diabetes mellitus    Hypertension    Myocardial infarction (HCC)    Sleep apnea    Sleep apnea    Tachycardia     Surgical History: Past Surgical History:  Procedure Laterality Date   CHOLECYSTECTOMY     knee  3 knee surgeries    Medications:   Current Facility-Administered Medications:    acetaminophen (TYLENOL) tablet 1,000 mg, 1,000 mg, Oral, Q6H PRN, 1,000 mg at 09/10/22 1952 **OR** acetaminophen (TYLENOL) suppository 650 mg, 650 mg, Rectal, Q6H PRN, Shitarev, Dimitry, MD   atorvastatin (LIPITOR) tablet 80 mg, 80 mg, Oral, Daily, Shelby Mattocks, DO, 80 mg at 09/11/22 0918   busPIRone (BUSPAR) tablet 10 mg, 10 mg, Oral, TID, [name], MD, 10 mg at 09/11/22 1610   camphor-menthol (SARNA) lotion, , Topical, PRN, Shelby Mattocks, DO   colchicine tablet 0.3 mg, 0.3 mg, Oral, BID, Nestor Ramp, MD, 0.3 mg at 09/11/22 0919   divalproex (DEPAKOTE) DR tablet 1,000 mg, 1,000 mg, Oral, Q12H, Mitchell, Jerrell L, DO, 1,000 mg at 09/11/22 0918   enoxaparin (LOVENOX) injection 50 mg, 50 mg, Subcutaneous,  Daily, Shitarev, Dimitry, MD, 50 mg at 09/11/22 0918   FLUoxetine (PROZAC) capsule 20 mg, 20 mg, Oral, Daily, Luberta Grabinski, Byrd Hesselbach, MD, 20 mg at 09/11/22 0918   fluticasone (FLONASE) 50 MCG/ACT nasal spray 2 spray, 2 spray, Each Nare, Daily, Alicia Amel, MD, 2 spray at 09/07/22 1030   gabapentin (NEURONTIN) capsule 600 mg, 600 mg, Oral, TID, Shitarev, Dimitry, MD, 600 mg at 09/11/22 9604   Gerhardt's butt cream, , Topical, BID, Carney Living, MD, Given at 09/11/22 0919   hydrOXYzine (ATARAX) tablet 25 mg, 25 mg, Oral, TID PRN, [name], MD, 25 mg at 09/11/22 0918   lidocaine (LIDODERM) 5 % 1 patch, 1 patch, Transdermal, Daily, Hindel, Leah, MD, 1 patch at 09/11/22 0917   melatonin tablet 5 mg, 5 mg, Oral, QHS PRN, [name], MD,  5 mg at 09/10/22 1954   metoprolol tartrate (LOPRESSOR) tablet 25 mg, 25 mg, Oral, BID, Darnelle Spangle B, MD, 25 mg at 09/11/22 5427   midazolam (VERSED) injection 2 mg, 2 mg, Intravenous, PRN, Alicia Amel, MD   midazolam (VERSED) injection 2 mg, 2 mg, Intravenous, Once, de Saintclair Halsted, Cortney E, NP   QUEtiapine (SEROQUEL) tablet 25 mg, 25 mg, Oral, BID PRN, 25 mg at 09/11/22 0918 **OR** OLANZapine (ZYPREXA) injection 2.5 mg, 2.5 mg, Intramuscular, BID PRN, [name], MD   pantoprazole (PROTONIX) EC tablet 40 mg, 40 mg, Oral, Daily, Darnelle Spangle B, MD, 40 mg at 09/11/22 0920   [START ON 09/21/2022] penicillin g benzathine (BICILLIN LA) 1200000 UNIT/2ML injection 2.4 Million Units, 2.4 Million Units, Intramuscular, Once, Odette Fraction, MD   penicillin G potassium 12 Million Units in dextrose 5 % 500 mL CONTINUOUS infusion, 12 Million Units, Intravenous, Q12H, Odette Fraction, MD, Last Rate: 41.7 mL/hr at 09/11/22 0252, 12 Million Units at 09/11/22 0252   QUEtiapine (SEROQUEL) tablet 250 mg, 250 mg, Oral, QHS, [name], MD, 250 mg at 09/10/22 1953    tamsulosin (FLOMAX) capsule 0.4 mg, 0.4 mg, Oral, Daily, Alicia Amel, MD, 0.4 mg at 09/11/22 0623  Allergies: Allergies  Allergen Reactions   Aleve [Naproxen Sodium] Anaphylaxis   Lisinopril Other (See Comments)    COUGH    Metformin And Related Diarrhea

## 2022-09-11 NOTE — Progress Notes (Signed)
Pt arrived to 6 north room 6 alert and oriented. Pain level 5/10. Bed in lowest position. Call light in reach. Will continue to monitor pt.

## 2022-09-11 NOTE — Progress Notes (Signed)
Pt refused blood sugar check.

## 2022-09-11 NOTE — Progress Notes (Signed)
Dinner tray ordered.

## 2022-09-11 NOTE — Plan of Care (Signed)
  Problem: Self-Concept: Goal: Level of anxiety will decrease Outcome: Progressing   Problem: Education: Goal: Knowledge of General Education information will improve Description: Including pain rating scale, medication(s)/side effects and non-pharmacologic comfort measures Outcome: Progressing   Problem: Nutrition: Goal: Adequate nutrition will be maintained Outcome: Progressing

## 2022-09-11 NOTE — Progress Notes (Signed)
Daily Progress Note Intern Pager: [phone]  Patient name: Cody Nolan Medical record number: [id number] Date of birth: July 05, 1960 Age: 62 y.o. Gender: male  Primary Care Provider: Lockie Mola, MD Consultants: Infectious Disease, Psychiatry  Code Status: FULL  Pt Overview and Major Events to Date:  8/4 - Admitted, confused 8/5 - Mental status improving 8/6 - Treponemal antibodies reactive.  ID consulted, LP planned. 8/7 - No LP per neurology, unable to obtain consent; started empiric Penicillin G IV 8/8 - Now hypomanic, further improved AMS 8/9 - More upset because he is IVC, continuing IVC 8/11 - Mental status improving, still hypomanic and upset concerning IVC 8/12 - IVC expired, not renewed   Assessment and Plan: [name] is a 62 y.o. male with a pertinent PMH of diabetes, HTN, CHF, and prior MI who presented with altered mentation and ?seizure-like activity when brought in by police from the community and was admitted for substance-induced psychosis and AKI, currently IVCed and being treated empirically for neurosyphilis.  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   Treating empirically. - ID on board, appreciate recs - Empiric Pen G 24 MU continuous daily for 14 days (8/8-8/22) - Benzathine Pen G 2.4 MU x1 after completion of IV course  - Will need follow up with non-treponemal test in 6 months, 12 months, 24  months        Controlled diabetes mellitus type 2 with complications (HCC)     A1c 6.8% one year ago, now 6.4 this admission. Taking 22 units of  Tresiba BID at home.  Patient still refusing CBGs inpatient at this time. - CBGs Q4h, add insulin if patient agrees to checks        AMS (altered mental status)     Continues to improve, less agitated, insight also better.  Paranoia  concerning meds improved.  Differential still includes drug-induced  bipolar  disorder/psychosis versus neurosyphilis. - IVC expired, not renewed given improving insight and with Psych  agreement - May transfer to IP psych when patient completes syphilis 2 week  treatment course, but will interface with psych team - Syphilis treatment as per primary problem - Appreciate psych consult, psychiatric medications managed per psych: -- Increase depakote to 1000 mg BID for mania             --Increase Depakote pending Depakote level -- Continue 250 mg seroquel at bedtime, 25 mg BID PRN agitation  -- Continue buspirone 10 mg TID for anxiety -- Continue fluoxetine 20 mg daily for depression -- Continue hydroxyzine 25 mg TID PRN for anxiety -- Continue Backup PRN geodon 2.5 mg PRN if pt refuses PO        Chronic pain     Still reporting back pain and hip pain, refused colchicine yesterday  because he did not recognize the medicine, agrees to take it after  counseling.  Still frustrated with not receiving tramadol and tizanidine,  although he agrees that meds could interact with his other meds. - Sarna lotion to affected areas for now - Continue gabapentin home dose 600 mg TID - Lidocaine patch daily - Add colchicine 0.3 mg in lieu of NSAIDs as patient is allergic        Irritation of skin of perianal region     Irritation evident on previous exam. Possibly secondary to inadequate  hygiene. - Desitin  ointment with each bowel movement - Sarna lotion as needed - Ensure adequately cleaned after bowel movements        Substance or medication-induced bipolar and related disorder with onset  during intoxication [facility])     Patient with UDS positive for all substances except opiates on  admission (benzos iatrogenic).  Does have history of substance use and on  multiple centrally acting meds.  Suspect polypharmacy contributed to his  presentation. - Limit centrally acting meds        Positive RPR test   FEN/GI: Regular, no mIVF PPx: Lovenox Dispo:  Inpatient psych  vs home, depends on psych progression after neurosyphilis Tx finished.  Barriers include 2 weeks of IV penicillin until 8/22.  Subjective:  This morning, patient appears more calm and somewhat drowsy.  States repeatedly that he wants to stay to be evaluated and that he wants to go to a behavioral facility after hospitalization, but will not go to county facility as there is "nothing in [employer] good enough."  States that he refused colchicine yesterday because he did not recognize the medicine and thought the bar code was incorrect and his wife was killed by medical error in the hospital.  Complains of continuing back and hip pain, requests tramadol.  Again discusses that Dr. Phineas Real and Dr. Raymondo Band will vouch for his mental status being baseline.  Objective: Temp:  [97.6 F (36.4 C)-98.4 F (36.9 C)] 98.3 F (36.8 C) (08/12 1350) Pulse Rate:  [89-98] 89 (08/12 1350) Resp:  [17-18] 17 (08/12 1350) BP: (97-116)/(61-78) 109/78 (08/12 1350) SpO2:  [95 %-100 %] 95 % (08/12 1350)  Physical Exam: General: Age-appropriate, resting comfortably in bed, NAD, alert and oriented x4. Cardiovascular: Intermittently tachycardic rate and regular rhythm. Normal S1/S2. No murmurs, rubs, or gallops appreciated. 2+ radial pulses. Skin: Warm and dry.  Rash on perineum with only mild erythema and skin breakdown. MSK: ROM of hips and back limited by pain. Extremities: No peripheral edema bilaterally. Psych: Less distractible, less pressured speech, remains tangential and difficult to interrupt.  Perseverates on IVC and pain meds.  Repeats self.  Mild paranoid ideation about meds.  Insight improving, judgement also improving.  Laboratory: Most recent CBC Lab Results  Component Value Date   WBC 10.8 (H) 09/07/2022   HGB 10.9 (L) 09/07/2022   HCT 33.7 (L) 09/07/2022   MCV 88.9 09/07/2022   PLT 321 09/07/2022   Most recent BMP    Latest Ref Rng & Units 09/07/2022   12:01 AM  BMP  Glucose 70 - 99 mg/dL 540    BUN 8 - 23 mg/dL 9   Creatinine 9.81 - 1.91 mg/dL 4.78   Sodium 295 - 621 mmol/L 139   Potassium 3.5 - 5.1 mmol/L 3.6   Chloride 98 - 111 mmol/L 107   CO2 22 - 32 mmol/L 22   Calcium 8.9 - 10.3 mg/dL 8.7     Other pertinent labs: - None  New Imaging/Diagnostic Tests: - None  Maurina Fawaz, MD 09/11/2022, 2:24 PM [facility]  FMTS Intern pager: [phone], text pages welcome Secure chat group [facility] Teaching Service

## 2022-09-12 DIAGNOSIS — F1994 Other psychoactive substance use, unspecified with psychoactive substance-induced mood disorder: Secondary | ICD-10-CM | POA: Diagnosis not present

## 2022-09-12 DIAGNOSIS — A523 Neurosyphilis, unspecified: Secondary | ICD-10-CM | POA: Diagnosis not present

## 2022-09-12 DIAGNOSIS — F19929 Other psychoactive substance use, unspecified with intoxication, unspecified: Secondary | ICD-10-CM | POA: Diagnosis not present

## 2022-09-12 MED ORDER — TRAMADOL HCL 50 MG PO TABS
50.0000 mg | ORAL_TABLET | Freq: Four times a day (QID) | ORAL | Status: DC | PRN
  Administered 2022-09-12 – 2022-09-14 (×7): 50 mg via ORAL
  Filled 2022-09-12 (×7): qty 1

## 2022-09-12 NOTE — TOC Progression Note (Signed)
Transition of Care [facility]) - Progression Note    Patient Details  Name: Cody Nolan MRN: [id number] Date of Birth: Jul 16, 1960  [facility] CM/SW Contact  Carley Hammed, LCSW Phone Number: 09/12/2022, 12:52 PM  Clinical Narrative:    CSW reviewed pt's chart and will continue to follow for any needs. TOC will be available to assist with pt needs.    Expected Discharge Plan: Psychiatric Hospital Barriers to Discharge: Continued Medical Work up  Expected Discharge Plan and Services                                               Social Determinants of Health (SDOH) Interventions SDOH Screenings   Depression (PHQ2-9): Low Risk  (07/05/2021)  Tobacco Use: Medium Risk (09/02/2022)    Readmission Risk Interventions     No data to display

## 2022-09-12 NOTE — Progress Notes (Addendum)
Paged Dr. Elliot Gurney that  patient is asking for his Tizanidine and Tramadol.  At 2121, per Dr. Elliot Gurney both medicines cannot be restarted at the moment, explained to patient that tylenol will be the only pain medicine I can give him at the moment.

## 2022-09-12 NOTE — Progress Notes (Signed)
Pt refused glucose check

## 2022-09-12 NOTE — Assessment & Plan Note (Addendum)
Improving.  Suspect patient is hypomanic and near baseline.  Psychotic features on admission possibly d/t substance use.  Neurosyphilis may be contributing.  Does still note OCD symptoms. - Discharge home with outpatient psych follow up per Psych recs - Syphilis treatment as per primary problem - Appreciate psych consult, psychiatric medications managed per their note

## 2022-09-12 NOTE — Consult Note (Signed)
Cody Nolan Psychiatry Consult Evaluation  Service Date: September 12, 2022 LOS:  LOS: 9 days    Primary Psychiatric Diagnoses  Substance induced bipolar disorder   Assessment  Cody Nolan is a 62 y.o. male admitted medically for 09/02/2022  7:57 PM for altered mental status in the setting of . He carries the psychiatric diagnoses of depression from PCP and has a past medical history of  CHF, diabetes, HTN, MI, sleep apnea. Psychiatry was consulted for Paranoid and tangential, no documented history of psychosis  by Dr. Marisue Humble.  His presentation at admission of pressured, tangential speech, grandiosity, lack of sleep, is most consistent with a diagnosis of mania; given recent substance use and absence of formal bipolar diagnosis this is most likely substance induced mood disorder.   At this time, the psychiatry team feels Cody Nolan is making good progress and is less manic than at initial presentation. He is markedly less paranoid, grandiose, and pressured. He is showing many signs of improvement. As long as patient stays in hospital to complete his neurosyphilis treatment regimen, there is no reason to involuntarily hospitalize him at the behavioral health hospital.  Previous outpatient psychotropic medications include prozac and buspirone and historically he has had a unknown response to these medications. He was semi compliant with medications prior to admission as evidenced by fill hx. Patient is showing good response to the combination of increased seroquel at night and depakote.    Patient will need a valproic acid level on 8/17 (order placed) in the evening to assess for trough level. If it comes back low-therapeutic, he would benefit from an increased dose.   Patient feels that many aspects of his life are out of control, and giving him as much ownership over his medical decision-making as possible will go a long way to winning his cooperation. (PRN medication of seroquel, hydroxyzine).    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 Active Problems:   Controlled diabetes mellitus type 2 with complications (HCC)   Substance or medication-induced bipolar and related disorder with onset during intoxication (HCC)   Chronic pain   Irritation of skin of perianal region   Positive RPR test     Plan   ## Psychiatric Medication Recommendations:  -- Continue depakote 1000 mg BID for mania  --Placed order for depakote level 5 days from increase (lab draw on 8/17 PM) -- Continue 250 mg seroquel at bedtime, 25 mg BID PRN agitation  -- Continue buspirone 10 mg TID for anxiety -- Continue fluoxetine 20 mg daily for depression -- Continue hydroxyzine 25 mg TID PRN for anxiety -- Continue Backup PRN geodon 2.5 mg PRN if pt refuses PO  ## Medical Decision Making Capacity:  -- Patient demonstrates clear understanding of costs, risks, and benefits of various medications. Patient appears to have capacity for most decisions.   ## Further Work-up:  -- agree with thorough w/u from primary and neuro teams -- UDS showed positive for benzodiazepines (iatrogenic), amphetamines, cocaine, THC  -- most recent EKG on 8/3 had QtC of 437 in setting of sinus tach -- Pertinent labwork reviewed earlier this admission includes: elevated CK (1003 on 8/4), HgbA1c 6.4, Reactive RPR (titer 1:16), proteinuria, hypocalcemia  and hypokalemia.  ## Disposition:  -- We recommend inpatient psychiatric hospitalization after medical hospitalization. Patient is under voluntary admission status at this time; please IVC if attempts to leave hospital before the completion of his neurosyphilis treatment.  ## Behavioral / Environmental:  --  Utilize compassion and acknowledge the patient's experiences while setting clear and realistic expectations for care.  ## Safety and Observation Level:  - Based on my clinical evaluation, I estimate the patient to be at low risk of self  harm in the current setting  - At this time, we recommend q15 minute checks.   Thank you for this consult request. Recommendations have been communicated to the primary team. We will continue to follow at this time.   [name], MD  Psychiatric and Social History   Relevant Aspects of Hospital Course:  Admitted on 09/02/2022 for agitation and bizarre behavior after a witnessed seizure.   Patient Report:  8/13: Met patient late morning in his new room on [address]. Patient was pleasant and agreeable throughout the interview.   Patient is still very talkative, but he is doing a better job recognizing and stopping himself when it is not his turn. He is not making as many grandiose claims, and seems to be thinking more logically as evidenced by the natural flow of conversation from topic to topic. Patient remains somewhat preoccupied by his osteoarthritis pain.  Throughout the course of the interview, he denied suicidal thoughts and homicidal thoughts. He was not responding to internal stimuli.  Pt is less paranoid and was preparing to host guests when the interview concluded.    Psych ROS:  Negative for poor sleep, lability, tangentiality (now circumstantial). Speech still fast, but no longer pressured.   Collateral information:  Pt offered the phone number of his attorney   Psychiatric History:  Information collected from pt, medical record  Prev Dx/Sx: ??? Depression and anxiety from prescriptions, potentially PTSD Current Psych Provider: PCP Home Meds (current): prozac, buspirone Previous Med Trials: unknown Therapy: unknown  Prior ECT: unknown Prior Psych Hospitalization: unknown  Prior Self Harm: denied  Family Psych History: unknown Family Hx suicide: unknown  Social History:  Living Situation: was living with [name] and her husband  Substance History Drug use: Cocaine, Amphetamines, THC   Exam Findings   Psychiatric Specialty  Exam:  Presentation  General Appearance: Casual  Eye Contact:Good  Speech:Clear and Coherent; Normal Rate  Speech Volume:Normal  Handedness:Right   Mood and Affect  Mood:Euthymic  Affect:Congruent   Thought Process  Thought Processes:Coherent; Linear  Descriptions of Associations:Intact  Orientation:Full (Time, Place and Person)  Thought Content:Logical  Hallucinations:Hallucinations: None  Ideas of Reference:None  Suicidal Thoughts:Suicidal Thoughts: No  Homicidal Thoughts:Homicidal Thoughts: No   Sensorium  Memory:Immediate Good; Recent Good; Remote Good  Judgment:Good  Insight:Good   Executive Functions  Concentration:Good  Attention Span:Good  Recall:Good  Fund of Knowledge:Good  Language:Good   Psychomotor Activity  Psychomotor Activity:Psychomotor Activity: Normal    Assets  Assets:Communication Skills; Desire for Improvement   Sleep  Sleep:Sleep: Good     Physical Exam: Vital signs:  Temp:  [97.7 F (36.5 C)-98.2 F (36.8 C)] 97.7 F (36.5 C) (08/13 0756) Pulse Rate:  [85-101] 101 (08/13 0756) Resp:  [18] 18 (08/13 0756) BP: (104-113)/(68-75) 105/75 (08/13 0756) SpO2:  [96 %-100 %] 100 % (08/13 0756)  Physical Exam Vitals and nursing note reviewed.  Constitutional:      Appearance: He is obese.  HENT:     Head: Normocephalic and atraumatic.  Eyes:     Conjunctiva/sclera: Conjunctivae normal.  Pulmonary:     Effort: Pulmonary effort is normal.  Musculoskeletal:        General: Tenderness present.  Skin:    General: Skin is warm  and dry.  Neurological:     Mental Status: He is alert and oriented to person, place, and time.  Psychiatric:        Mood and Affect: Mood is elated.        Behavior: Behavior is hyperactive. Behavior is cooperative.        Cognition and Memory: Cognition and memory normal.        Judgment: Judgment is impulsive.     Blood pressure 105/75, pulse (!) 101, temperature 97.7 F (36.5  C), temperature source Oral, resp. rate 18, height 5\' 8"  (1.727 m), weight 113.4 kg, SpO2 100%. Body mass index is 38.01 kg/m.   Other History   These have been pulled in through the EMR, reviewed, and updated if appropriate.   Family History:  The patient's family history includes Heart disease in his mother; Hypertension in an other family member.  Medical History: Past Medical History:  Diagnosis Date   CHF (congestive heart failure) (HCC)    Depression    Diabetes mellitus    Hypertension    Myocardial infarction (HCC)    Sleep apnea    Sleep apnea    Tachycardia     Surgical History: Past Surgical History:  Procedure Laterality Date   CHOLECYSTECTOMY     knee  3 knee surgeries    Medications:   Current Facility-Administered Medications:    acetaminophen (TYLENOL) tablet 1,000 mg, 1,000 mg, Oral, Q6H PRN, 1,000 mg at 09/11/22 2111 **OR** acetaminophen (TYLENOL) suppository 650 mg, 650 mg, Rectal, Q6H PRN, Shitarev, Dimitry, MD   atorvastatin (LIPITOR) tablet 80 mg, 80 mg, Oral, Daily, Shelby Mattocks, DO, 80 mg at 09/12/22 1610   busPIRone (BUSPAR) tablet 10 mg, 10 mg, Oral, TID, [name], MD, 10 mg at 09/12/22 9604   camphor-menthol (SARNA) lotion, , Topical, PRN, Shelby Mattocks, DO   colchicine tablet 0.3 mg, 0.3 mg, Oral, BID, Nestor Ramp, MD, 0.3 mg at 09/12/22 5409   divalproex (DEPAKOTE) DR tablet 1,000 mg, 1,000 mg, Oral, Q12H, Mitchell, Jerrell L, DO, 1,000 mg at 09/12/22 0819   enoxaparin (LOVENOX) injection 50 mg, 50 mg, Subcutaneous, Daily, Shitarev, Dimitry, MD, 50 mg at 09/12/22 0824   FLUoxetine (PROZAC) capsule 20 mg, 20 mg, Oral, Daily, Sion Thane, Byrd Hesselbach, MD, 20 mg at 09/12/22 0820   fluticasone (FLONASE) 50 MCG/ACT nasal spray 2 spray, 2 spray, Each Nare, Daily, Alicia Amel, MD, 2 spray at 09/12/22 8119   gabapentin (NEURONTIN) capsule 600 mg, 600 mg, Oral, TID, Shitarev, Dimitry, MD, 600 mg at 09/12/22 1478    Gerhardt's butt cream, , Topical, BID, Carney Living, MD, Given at 09/12/22 2956   hydrOXYzine (ATARAX) tablet 25 mg, 25 mg, Oral, TID PRN, [name], MD, 25 mg at 09/12/22 1014   lidocaine (LIDODERM) 5 % 1 patch, 1 patch, Transdermal, Daily, Hindel, Leah, MD, 1 patch at 09/12/22 0824   melatonin tablet 5 mg, 5 mg, Oral, QHS PRN, [name], MD, 5 mg at 09/11/22 2111   metoprolol tartrate (LOPRESSOR) tablet 25 mg, 25 mg, Oral, BID, Darnelle Spangle B, MD, 25 mg at 09/12/22 0820   midazolam (VERSED) injection 2 mg, 2 mg, Intravenous, PRN, Alicia Amel, MD   QUEtiapine (SEROQUEL) tablet 25 mg, 25 mg, Oral, BID PRN, 25 mg at 09/12/22 0826 **OR** OLANZapine (ZYPREXA) injection 2.5 mg, 2.5 mg, Intramuscular, BID PRN, [name], MD   pantoprazole (PROTONIX) EC tablet 40 mg, 40 mg,  Oral, Daily, Darnelle Spangle B, MD, 40 mg at 09/12/22 0821   [START ON 09/21/2022] penicillin g benzathine (BICILLIN LA) 1200000 UNIT/2ML injection 2.4 Million Units, 2.4 Million Units, Intramuscular, Once, Odette Fraction, MD   penicillin G potassium 12 Million Units in dextrose 5 % 500 mL CONTINUOUS infusion, 12 Million Units, Intravenous, Q12H, Manandhar, Rozell Searing, MD, Last Rate: 41.7 mL/hr at 09/12/22 0357, 12 Million Units at 09/12/22 0357   QUEtiapine (SEROQUEL) tablet 250 mg, 250 mg, Oral, QHS, Trana Ressler, Byrd Hesselbach, MD, 250 mg at 09/11/22 2111   tamsulosin (FLOMAX) capsule 0.4 mg, 0.4 mg, Oral, Daily, Darnelle Spangle B, MD, 0.4 mg at 09/12/22 0820   traMADol (ULTRAM) tablet 50 mg, 50 mg, Oral, Q6H PRN, Lockie Mola, MD  Allergies: Allergies  Allergen Reactions   Aleve [Naproxen Sodium] Anaphylaxis   Lisinopril Other (See Comments)    COUGH    Metformin And Related Diarrhea

## 2022-09-12 NOTE — Progress Notes (Signed)
Daily Progress Note Intern Pager: [phone]  Patient name: Cody Nolan Medical record number: [id number] Date of birth: Feb 29, 1960 Age: 62 y.o. Gender: male  Primary Care Provider: Lockie Mola, MD Consultants: Infectious Disease, Psychiatry  Code Status: FULL  Pt Overview and Major Events to Date:  8/4 - Admitted, confused 8/5 - Mental status improving 8/6 - Treponemal antibodies reactive.  ID consulted, LP planned. 8/7 - No LP per neurology, unable to obtain consent; started empiric Penicillin G IV 8/8 - Now hypomanic, further improved AMS 8/9 - More upset because he is IVC, continuing IVC 8/11 - Mental status improving, still hypomanic and upset concerning IVC 8/12 - IVC expired, not renewed 8/13 - Happier   Assessment and Plan: Cody Nolan is a 62 y.o. male with a pertinent PMH of diabetes, HTN, CHF, and prior MI who presented with altered mentation and ?seizure-like activity when brought in by police from the community and was admitted for substance-induced psychosis and AKI, currently no longer IVC and being treated empirically for neurosyphilis.  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   Treating empirically. - ID on board, appreciate recs - Empiric Pen G 24 MU continuous daily for 14 days (8/8-8/22) - Benzathine Pen G 2.4 MU x1 after completion of IV course  - Will need follow up with non-treponemal test in 6 months, 12 months, 24  months        Controlled diabetes mellitus type 2 with complications (HCC)     A1c 6.8% one year ago, now 6.4 this admission. Taking 22 units of  Tresiba BID at home.  Patient still refusing CBGs inpatient at this time. - CBGs Q4h, add insulin if patient agrees to checks        Chronic pain     Still reporting back pain and hip pain, no improvement with colchicine.   Still frustrated with not receiving tramadol and tizanidine.   Concern for  QTc prolongation with multiple new meds. - Sarna lotion to affected areas for now - Continue gabapentin home dose 600 mg TID - Lidocaine patch daily - Colchicine 0.3 mg in lieu of NSAIDs as patient is allergic - Obtain EKG and start home tramadol if QTc normal.        Irritation of skin of perianal region     Improving.  Possibly secondary to inadequate hygiene. - Desitin ointment with each bowel movement - Sarna lotion as needed - Ensure adequately cleaned after bowel movements        Substance or medication-induced bipolar and related disorder with onset  during intoxication (HCC)     Continuing gradual improvement in mental status and cognition.  Better  insight and judgement.  Still difficult to redirect and perseverates, but  greatly improved. Differential still drug-induced bipolar  disorder/psychosis versus neurosyphilis (cannot exclude both). - May transfer to IP psych when patient completes syphilis 2 week  treatment course, but will interface with psych team later - Syphilis treatment as per primary problem - Appreciate psych consult, psychiatric medications managed per psych: -- Continue depakote 1000 mg BID for mania             --Placed order for depakote level 5 days from increase (lab  draw on 8/17 PM) -- Continue 250 mg seroquel at bedtime, 25 mg BID PRN agitation  -- Continue buspirone 10 mg TID for  anxiety -- Continue fluoxetine 20 mg daily for depression -- Continue hydroxyzine 25 mg TID PRN for anxiety -- Continue Backup PRN geodon 2.5 mg PRN if pt refuses PO        Positive RPR test   FEN/GI: Regular, no mIVF PPx: Lovenox Dispo:  Inpatient psych vs home, depends on psych progression after neurosyphilis Tx finished.  Barriers include 2 weeks of IV penicillin until 8/22.   Subjective:  This morning, patient is content and grateful for the lifting of his IVC.  Does continue to express concerns about "getting his meds right."  Requests  tramadol and tizanidine and perseverates on this topic.  Does report significant hip and back pain.  Objective: Temp:  [97.7 F (36.5 C)-98.3 F (36.8 C)] 97.7 F (36.5 C) (08/13 0756) Pulse Rate:  [85-101] 101 (08/13 0756) Resp:  [17-18] 18 (08/13 0756) BP: (104-113)/(68-78) 105/75 (08/13 0756) SpO2:  [95 %-100 %] 100 % (08/13 0756)  Physical Exam: General: Age-appropriate, resting comfortably in bed, NAD, WNWD, alert. Cardiovascular: Intermittently tachycardic and regular rhythm. Normal S1/S2. No murmurs, rubs, or gallops appreciated. 2+ radial pulses. Pulmonary: Clear bilaterally to ascultation. No increased WOB, no accessory muscle usage on room air. No wheezes, rales, or crackles. Skin: Warm and dry. Psych: No pressured speech, interruptible but distractible.  Less tangential.  Some paranoid ideation.  No SI/HI.  Laboratory: Most recent CBC Lab Results  Component Value Date   WBC 10.8 (H) 09/07/2022   HGB 10.9 (L) 09/07/2022   HCT 33.7 (L) 09/07/2022   MCV 88.9 09/07/2022   PLT 321 09/07/2022   Most recent BMP    Latest Ref Rng & Units 09/07/2022   12:01 AM  BMP  Glucose 70 - 99 mg/dL 244   BUN 8 - 23 mg/dL 9   Creatinine 0.10 - 2.72 mg/dL 5.36   Sodium 644 - 034 mmol/L 139   Potassium 3.5 - 5.1 mmol/L 3.6   Chloride 98 - 111 mmol/L 107   CO2 22 - 32 mmol/L 22   Calcium 8.9 - 10.3 mg/dL 8.7     Other pertinent labs: - None  New Imaging/Diagnostic Tests: - EKG pending  Zachary Lovins, MD 09/12/2022, 1:41 PM [facility]  FMTS Intern pager: [phone], text pages welcome Secure chat group [facility] Teaching Service

## 2022-09-12 NOTE — Plan of Care (Signed)
  Problem: Safety: Goal: Ability to remain free from injury will improve Outcome: Not Progressing   Problem: Pain Managment: Goal: General experience of comfort will improve Outcome: Not Progressing   Problem: Coping: Goal: Level of anxiety will decrease Outcome: Not Progressing   Problem: Elimination: Goal: Will not experience complications related to bowel motility Outcome: Not Progressing

## 2022-09-13 DIAGNOSIS — F1994 Other psychoactive substance use, unspecified with psychoactive substance-induced mood disorder: Secondary | ICD-10-CM | POA: Diagnosis not present

## 2022-09-13 DIAGNOSIS — A523 Neurosyphilis, unspecified: Secondary | ICD-10-CM | POA: Diagnosis not present

## 2022-09-13 DIAGNOSIS — F19929 Other psychoactive substance use, unspecified with intoxication, unspecified: Secondary | ICD-10-CM | POA: Diagnosis not present

## 2022-09-13 MED ORDER — TIZANIDINE HCL 2 MG PO TABS
4.0000 mg | ORAL_TABLET | Freq: Two times a day (BID) | ORAL | Status: DC
  Administered 2022-09-13 – 2022-09-22 (×19): 4 mg via ORAL
  Filled 2022-09-13 (×19): qty 2

## 2022-09-13 NOTE — Progress Notes (Signed)
Daily Progress Note Intern Pager: [phone]  Patient name: JIBRIL ERHARDT Medical record number: [id number] Date of birth: 1960/06/16 Age: 62 y.o. Gender: male  Primary Care Provider: Lockie Mola, MD Consultants: Infectious Disease, Psychiatry  Code Status: FULL  Pt Overview and Major Events to Date:  8/4 - Admitted, confused 8/5 - Mental status improving 8/6 - Treponemal antibodies reactive.  ID consulted, LP planned. 8/7 - No LP per neurology, unable to obtain consent; started empiric Penicillin G IV 8/8 - Now hypomanic, further improved AMS 8/9 - More upset because he is IVC, continuing IVC 8/11 - Mental status improving, still hypomanic and upset concerning IVC 8/12 - IVC expired, not renewed 8/13 - Happier b/c IVC discontinued, restarted tramadol 8/14 - Restarted tizanidine   Assessment and Plan: DEUNDRE ELA is a 62 y.o. male with a pertinent PMH of diabetes, HTN, CHF, prior MI, and BPD who presented with altered mentation and ?seizure-like activity when brought in by police from the community and was admitted for substance-induced psychosis and AKI, currently no longer IVC and being treated empirically for neurosyphilis.  Gradually adjusting medications and patient's psychiatric state is continually improving. 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   Treating empirically. - ID on board, appreciate recs - Empiric Pen G 24 MU continuous daily for 14 days (8/8-8/22) - Benzathine Pen G 2.4 MU x1 after completion of IV course  - Will need follow up with non-treponemal test in 6 months, 12 months, 24  months        Controlled diabetes mellitus type 2 with complications (HCC)     A1c 6.8% one year ago, now 6.4 this admission. Taking 22 units of  Tresiba BID at home.  Patient still refusing CBGs inpatient at this time. - CBGs Q4h, add insulin if patient agrees to checks         Chronic pain     Significant degenerative changes of L hip.  Still reporting back pain  and hip pain, agrees that it will be difficult to treat pain and wants hip  surgery outpatient.  QTc normal 8/13, home tramadol added.  Now adding  home tizanidine as well. - Sarna lotion to affected areas for now - Continue gabapentin home dose 600 mg TID - Lidocaine patch daily - Colchicine 0.3 mg in lieu of NSAIDs as patient is allergic - Tramadol 50 mg Q6h PRN - Tizanidine 4 mg BID        Irritation of skin of perianal region     Improving.  Possibly secondary to inadequate hygiene. - Gerhardt's butt cream in use - Sarna lotion as needed - Ensure adequately cleaned after bowel movements        Substance or medication-induced bipolar and related disorder with onset  during intoxication (HCC)     Continuing gradual improvement in mental status and cognition.   Hypomanic.  Better insight and judgement.  Interruptible, more euthymic.   Differential still drug-induced bipolar disorder/psychosis versus  neurosyphilis (cannot exclude both). - May transfer to IP psych when patient completes syphilis 2 week  treatment course, but will interface with psych team later - Syphilis treatment as per primary problem - Appreciate psych consult, psychiatric medications managed per psych,  note below: -- Continue depakote 1000 mg BID for mania             --Placed  order for depakote level 5 days from increase (lab  draw on 8/17 PM) -- Continue 250 mg seroquel at bedtime, 25 mg BID PRN agitation  -- Continue buspirone 10 mg TID for anxiety -- Continue fluoxetine 20 mg daily for depression -- Continue hydroxyzine 25 mg TID PRN for anxiety -- Continue Backup PRN geodon 2.5 mg PRN if pt refuses PO        Positive RPR test   FEN/GI: Regular, no mIVF PPx: Lovenox Dispo:  Inpatient psych vs home, depends on psych progression after neurosyphilis Tx finished.  Barriers include 2 weeks of IV penicillin  until 8/22.  Subjective:  This morning, patient is happy to get his tramadol, but reports that his hip and back pain are continuing and significant.  Asks for stronger pain medications and his tizanidine.  Reports that he feels better, calmer, and more content and that the psychiatric medications are working.  Satisfied with his care.  Wishes to pursue hip surgery after discharge.  Currently happy   Objective: Temp:  [97.6 F (36.4 C)-98.5 F (36.9 C)] 97.6 F (36.4 C) (08/14 0758) Pulse Rate:  [87-92] 90 (08/14 0758) Resp:  [16-20] 16 (08/14 0758) BP: (101-114)/(69-72) 101/69 (08/14 0758) SpO2:  [96 %-99 %] 97 % (08/14 0758)  Physical Exam: General: Adult male, resting in bed, NAD. Cardiovascular: Regular rate and rhythm. Normal S1/S2. No murmurs, rubs, or gallops appreciated. 2+ radial pulses. MSK: Passive ROM of L hip limited by pain, able to stand and ambulate with limp. Psych: Alert and oriented x4. Interruptible, moderately tangential, no pressured speech.  Has insight into condition and medications.  Less disinhibited, improved judgement.  No SI/HI.  Laboratory: Most recent CBC Lab Results  Component Value Date   WBC 10.8 (H) 09/07/2022   HGB 10.9 (L) 09/07/2022   HCT 33.7 (L) 09/07/2022   MCV 88.9 09/07/2022   PLT 321 09/07/2022   Most recent BMP    Latest Ref Rng & Units 09/07/2022   12:01 AM  BMP  Glucose 70 - 99 mg/dL 962   BUN 8 - 23 mg/dL 9   Creatinine 9.52 - 8.41 mg/dL 3.24   Sodium 401 - 027 mmol/L 139   Potassium 3.5 - 5.1 mmol/L 3.6   Chloride 98 - 111 mmol/L 107   CO2 22 - 32 mmol/L 22   Calcium 8.9 - 10.3 mg/dL 8.7     Other pertinent labs: - None  New Imaging/Diagnostic Tests: - None  Eyan Hagood, MD 09/13/2022, 12:02 PM [facility]  FMTS Intern pager: [phone], text pages welcome Secure chat group [facility] Teaching Service

## 2022-09-14 DIAGNOSIS — F3110 Bipolar disorder, current episode manic without psychotic features, unspecified: Secondary | ICD-10-CM | POA: Diagnosis present

## 2022-09-14 DIAGNOSIS — G8929 Other chronic pain: Secondary | ICD-10-CM | POA: Diagnosis not present

## 2022-09-14 DIAGNOSIS — F429 Obsessive-compulsive disorder, unspecified: Secondary | ICD-10-CM | POA: Diagnosis present

## 2022-09-14 DIAGNOSIS — A523 Neurosyphilis, unspecified: Secondary | ICD-10-CM | POA: Diagnosis not present

## 2022-09-14 LAB — VALPROIC ACID LEVEL: Valproic Acid Lvl: 62 ug/mL (ref 50.0–100.0)

## 2022-09-14 MED ORDER — ORAL CARE MOUTH RINSE: 15.0000 mL | OROMUCOSAL | Status: DC | PRN

## 2022-09-14 MED ORDER — DIVALPROEX SODIUM 250 MG PO DR TAB
500.0000 mg | DELAYED_RELEASE_TABLET | Freq: Once | ORAL | Status: AC
  Administered 2022-09-14: 500 mg via ORAL
  Filled 2022-09-14: qty 1
  Filled 2022-09-14: qty 2
  Filled 2022-09-14: qty 1

## 2022-09-14 MED ORDER — FLUOXETINE HCL 20 MG PO CAPS
60.0000 mg | ORAL_CAPSULE | Freq: Every day | ORAL | Status: DC
  Administered 2022-09-15 – 2022-09-22 (×8): 60 mg via ORAL
  Filled 2022-09-14 (×8): qty 3

## 2022-09-14 MED ORDER — FLUOXETINE HCL 20 MG PO CAPS
40.0000 mg | ORAL_CAPSULE | Freq: Once | ORAL | Status: AC
  Administered 2022-09-14: 40 mg via ORAL
  Filled 2022-09-14: qty 2

## 2022-09-14 MED ORDER — DIVALPROEX SODIUM 250 MG PO DR TAB
1500.0000 mg | DELAYED_RELEASE_TABLET | Freq: Two times a day (BID) | ORAL | Status: DC
  Administered 2022-09-14 – 2022-09-22 (×16): 1500 mg via ORAL
  Filled 2022-09-14 (×15): qty 6

## 2022-09-14 NOTE — Progress Notes (Signed)
Pt refuses blood sugar checks and states he is no longer a diabetic and does not need his sugar checked. A1C updated during hospitalization and pt aware so will not allow any bs checks at this time.

## 2022-09-14 NOTE — Consult Note (Addendum)
Cody Nolan Psychiatry Consult Evaluation  Service Date: September 14, 2022 LOS:  LOS: 11 days    Primary Psychiatric Diagnoses  Bipolar I Disorder OCD  Assessment  Cody Nolan is a 62 y.o. male admitted medically for 09/02/2022  7:57 PM for altered mental status in the setting of . He carries the psychiatric diagnoses of depression from PCP and has a past medical history of  CHF, diabetes, HTN, MI, sleep apnea. Psychiatry was consulted for Paranoid and tangential, no documented history of psychosis  by Dr. Marisue Nolan.  His presentation at admission of pressured, tangential speech, grandiosity, lack of sleep, is most consistent with a diagnosis of mania; given the patient's persistent mania in the absence of intoxicating substances, we have changed our diagnosis to Bipolar I Disorder.   At this time, the psychiatry team feels Cody Nolan is making good progress and is no longer manic. He is markedly less paranoid, grandiose, and pressured. As long as patient stays in hospital to complete his neurosyphilis treatment regimen, the team feels that he is appropriate for outpatient follow-up rather than inpatient hospitalization.  Previous outpatient psychotropic medications include prozac and buspirone and historically he has had a unknown response to these medications. He was semi compliant with medications prior to admission as evidenced by fill hx. Patient is showing good response to the combination of increased seroquel at night and depakote.    Patient will need a valproic acid level on 8/19 (order placed) in the evening to assess for trough level. If it comes back low-therapeutic, he would benefit from an increased dose.   Patient feels that many aspects of his life are out of control, and giving him as much ownership over his medical decision-making as possible will go a long way to winning his cooperation. (PRN medication of seroquel, hydroxyzine).   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 Active Problems:   Controlled diabetes mellitus type 2 with complications (HCC)   Substance or medication-induced bipolar and related disorder with onset during intoxication (HCC)   Chronic pain   Irritation of skin of perianal region   Positive RPR test     Plan   ## Psychiatric Medication Recommendations:  -- Increase depakote to 1500 mg BID for mania  -- Placed order for depakote level 5 days from increase (lab draw on 8/19 PM) -- Continue 250 mg seroquel at bedtime, 25 mg BID PRN agitation  -- Continue buspirone 10 mg TID for anxiety -- Change fluoxetine 20 mg to 60 mg daily for OCD -- Continue hydroxyzine 25 mg TID PRN for anxiety -- Continue Backup PRN geodon 2.5 mg PRN if pt refuses PO  ## Medical Decision Making Capacity:  -- Patient demonstrates clear understanding of costs, risks, and benefits of various medications. Patient appears to have capacity for most decisions.   ## Further Work-up:  -- agree with thorough w/u from primary and neuro teams -- UDS showed positive for benzodiazepines (iatrogenic), amphetamines, cocaine, THC  -- most recent EKG on 8/3 had QtC of 437 in setting of sinus tach -- Pertinent labwork reviewed earlier this admission includes: elevated CK (1003 on 8/4), HgbA1c 6.4, Reactive RPR (titer 1:16), proteinuria, hypocalcemia  and hypokalemia.  ## Disposition:  -- We recommend outpatient management of his Bipolar I Disorder and OCD with close follow-up. Patient is under voluntary admission status at this time; but please IVC if attempts to leave hospital before the completion of his neurosyphilis treatment.  ## Behavioral /  Environmental:  --  Engineer, structural and acknowledge the patient's experiences while setting clear and realistic expectations for care.  ## Safety and Observation Level:  - Based on my clinical evaluation, I estimate the patient to be at low risk of self harm in the  current setting  - At this time, we recommend q15 minute checks.   Thank you for this consult request. Recommendations have been communicated to the primary team. We will continue to follow at this time.   Cody Dys, MD  Psychiatric and Social History   Relevant Aspects of Hospital Course:  Admitted on 09/02/2022 for agitation and bizarre behavior after a witnessed seizure.   Patient Report:  8/15: Met patient this morning with family medicine resident Dr Cody Nolan. Patient was pleasant and agreeable throughout the interview. Patient was not thrilled at the need for continued blood work, but he understood the necessity for it while he is in the hospital.  Patient is still very talkative, but he is doing a better job recognizing and stopping himself when it is not his turn.   Throughout the course of the interview, he denied suicidal thoughts and homicidal thoughts. He was not responding to internal stimuli.  Pt was not at all paranoid during the interview, but was concerned about the number of blood draws and being misdiagnosed with T2DM.    Psych ROS:  Negative for poor sleep, lability, speech is linear. Speech still fast, but no longer pressured.   Collateral information:  Pt offered the phone number of his attorney   Psychiatric History:  Information collected from pt, medical record  Prev Dx/Sx: ??? Depression and anxiety from prescriptions, potentially PTSD Current Psych Provider: PCP Home Meds (current): prozac, buspirone Previous Med Trials: unknown Therapy: unknown  Prior ECT: unknown Prior Psych Hospitalization: unknown  Prior Self Harm: denied  Family Psych History: unknown Family Hx suicide: unknown  Social History:  Living Situation: was living with Cody Nolan and her husband  Substance History Drug use: Cocaine, Amphetamines, THC   Exam Findings   Psychiatric Specialty Exam:  Presentation  General Appearance: Appropriate for  Environment Eye Contact:Good Speech:Clear and Coherent Speech Volume:Normal Handedness:Right   Mood and Affect  Mood:Euthymic Affect:Congruent   Thought Process  Thought Processes:Coherent; Linear; Goal Directed Descriptions of Associations:Intact Orientation:Full (Time, Place and Person) Thought Content:Logical Hallucinations:Hallucinations: None Ideas of Reference:None Suicidal Thoughts:Suicidal Thoughts: No Homicidal Thoughts:Homicidal Thoughts: No   Sensorium  Memory:Immediate Good; Recent Good; Remote Good  Judgment:Fair  Insight:Fair   Executive Functions  Concentration:Good  Attention Span:Good  Recall:Good  Fund of Knowledge:Good  Language:Good  Psychomotor Activity  Psychomotor Activity:Psychomotor Activity: Normal  Assets  Assets:Communication Skills; Desire for Improvement  Sleep  Sleep:Sleep: Good  Physical Exam: Vital signs:  Temp:  [97.8 F (36.6 C)-98.4 F (36.9 C)] 98.1 F (36.7 C) (08/15 0840) Pulse Rate:  [84-100] 100 (08/15 0840) Resp:  [16-20] 17 (08/15 0840) BP: (99-123)/(66-74) 123/67 (08/15 0840) SpO2:  [96 %-97 %] 97 % (08/15 0840)  Physical Exam Vitals and nursing note reviewed.  Constitutional:      Appearance: He is obese.  HENT:     Head: Normocephalic and atraumatic.  Eyes:     Conjunctiva/sclera: Conjunctivae normal.  Pulmonary:     Effort: Pulmonary effort is normal.  Musculoskeletal:        General: Tenderness present.  Skin:    General: Skin is warm and dry.  Neurological:     Mental Status: He is alert and oriented to  person, place, and time.  Psychiatric:        Behavior: Behavior is cooperative.        Thought Content: Thought content normal.        Cognition and Memory: Cognition and memory normal.        Judgment: Judgment is impulsive.    Blood pressure 123/67, pulse 100, temperature 98.1 F (36.7 C), temperature source Oral, resp. rate 17, height 5\' 8"  (1.727 m), weight 113.4 kg, SpO2 97%.  Body mass index is 38.01 kg/m.   Other History   These have been pulled in through the EMR, reviewed, and updated if appropriate.   Family History:  The patient's family history includes Heart disease in his mother; Hypertension in an other family member.  Medical History: Past Medical History:  Diagnosis Date   CHF (congestive heart failure) (HCC)    Depression    Diabetes mellitus    Hypertension    Myocardial infarction (HCC)    Sleep apnea    Sleep apnea    Tachycardia     Surgical History: Past Surgical History:  Procedure Laterality Date   CHOLECYSTECTOMY     knee  3 knee surgeries    Medications:   Current Facility-Administered Medications:    acetaminophen (TYLENOL) tablet 1,000 mg, 1,000 mg, Oral, Q6H PRN, 1,000 mg at 09/14/22 0942 **OR** acetaminophen (TYLENOL) suppository 650 mg, 650 mg, Rectal, Q6H PRN, Shitarev, Dimitry, MD   atorvastatin (LIPITOR) tablet 80 mg, 80 mg, Oral, Daily, Shelby Mattocks, DO, 80 mg at 09/14/22 0941   busPIRone (BUSPAR) tablet 10 mg, 10 mg, Oral, TID, Cody Dys, MD, 10 mg at 09/14/22 0941   camphor-menthol (SARNA) lotion, , Topical, PRN, Shelby Mattocks, DO   divalproex (DEPAKOTE) DR tablet 1,500 mg, 1,500 mg, Oral, Q12H, Kirrah Mustin, Byrd Hesselbach, MD   enoxaparin (LOVENOX) injection 50 mg, 50 mg, Subcutaneous, Daily, Shitarev, Dimitry, MD, 50 mg at 09/14/22 0945   FLUoxetine (PROZAC) capsule 20 mg, 20 mg, Oral, Daily, Cody Dys, MD, 20 mg at 09/14/22 0943   fluticasone (FLONASE) 50 MCG/ACT nasal spray 2 spray, 2 spray, Each Nare, Daily, Alicia Amel, MD, 2 spray at 09/12/22 1610   gabapentin (NEURONTIN) capsule 600 mg, 600 mg, Oral, TID, Shitarev, Dimitry, MD, 600 mg at 09/14/22 9604   Gerhardt's butt cream, , Topical, BID, Carney Living, MD, Given at 09/14/22 0954   hydrOXYzine (ATARAX) tablet 25 mg, 25 mg, Oral, TID PRN, Cody Dys, MD, 25 mg at  09/14/22 0953   lidocaine (LIDODERM) 5 % 1 patch, 1 patch, Transdermal, Daily, Hindel, Leah, MD, 1 patch at 09/14/22 0945   melatonin tablet 5 mg, 5 mg, Oral, QHS PRN, Cody Dys, MD, 5 mg at 09/11/22 2111   metoprolol tartrate (LOPRESSOR) tablet 25 mg, 25 mg, Oral, BID, Darnelle Spangle B, MD, 25 mg at 09/14/22 0944   midazolam (VERSED) injection 2 mg, 2 mg, Intravenous, PRN, Alicia Amel, MD   QUEtiapine (SEROQUEL) tablet 25 mg, 25 mg, Oral, BID PRN, 25 mg at 09/14/22 0942 **OR** OLANZapine (ZYPREXA) injection 2.5 mg, 2.5 mg, Intramuscular, BID PRN, Cody Dys, MD   Oral care mouth rinse, 15 mL, Mouth Rinse, PRN, Nestor Ramp, MD   pantoprazole (PROTONIX) EC tablet 40 mg, 40 mg, Oral, Daily, Darnelle Spangle B, MD, 40 mg at 09/14/22 0942   [START ON 09/21/2022] penicillin g benzathine (BICILLIN LA) 1200000 UNIT/2ML injection 2.4 Million Units, 2.4 Million Units,  Intramuscular, Once, Odette Fraction, MD   penicillin G potassium 12 Million Units in dextrose 5 % 500 mL CONTINUOUS infusion, 12 Million Units, Intravenous, Q12H, Manandhar, Sabina, MD, Last Rate: 41.7 mL/hr at 09/14/22 0421, 12 Million Units at 09/14/22 0421   QUEtiapine (SEROQUEL) tablet 250 mg, 250 mg, Oral, QHS, Sherry Rogus, Byrd Hesselbach, MD, 250 mg at 09/13/22 2138   tamsulosin (FLOMAX) capsule 0.4 mg, 0.4 mg, Oral, Daily, Darnelle Spangle B, MD, 0.4 mg at 09/14/22 0942   tiZANidine (ZANAFLEX) tablet 4 mg, 4 mg, Oral, BID, Shitarev, Dimitry, MD, 4 mg at 09/14/22 0943   traMADol (ULTRAM) tablet 50 mg, 50 mg, Oral, Q6H PRN, Lockie Mola, MD, 50 mg at 09/14/22 0942  Allergies: Allergies  Allergen Reactions   Aleve [Naproxen Sodium] Anaphylaxis   Lisinopril Other (See Comments)    COUGH    Metformin And Related Diarrhea

## 2022-09-14 NOTE — Progress Notes (Signed)
Daily Progress Note Intern Pager: [phone]  Patient name: Cody Nolan Medical record number: [id number] Date of birth: Apr 03, 1960 Age: 62 y.o. Gender: male  Primary Care Provider: Lockie Mola, MD Consultants: ID, Psych Code Status: FULL  Pt Overview and Major Events to Date:  8/4 - Admitted, confused 8/5 - Mental status improving 8/6 - Treponemal antibodies reactive.  ID consulted, LP planned. 8/7 - No LP per neurology, unable to obtain consent; started empiric Penicillin G IV 8/8 - Now hypomanic, further improved AMS 8/11 - Mental status improving, still hypomanic and upset concerning IVC 8/12 - IVC expired, not renewed 8/15 - Likely near mental baseline  Assessment and Plan: Cody Nolan is a 62 y.o. male with a pertinent PMH of T2DM, HTN, CHF, prior MI, and BPD who presented with altered mentation and ?seizure-like activity when brought in by police from the community and was admitted for substance-induced psychosis and AKI, currently no longer IVC and being treated empirically for neurosyphilis.  Gradually adjusting medications and patient's psychiatric state is continually improving.  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   Treating empirically. - ID on board, appreciate recs - Empiric Pen G 24 MU continuous daily for 14 days (8/8-8/22) - Benzathine Pen G 2.4 MU x1 after completion of IV course  - Will need follow up with non-treponemal test in 6 months, 12 months, 24  months        Controlled diabetes mellitus type 2 with complications (HCC)     A1c 6.8% one year ago, now 6.4 this admission. Taking 22 units of  Tresiba BID at home.  Patient still refusing CBGs inpatient at this time,  will get an AM BMP to check blood glucose - CBGs Q4h ordered, add insulin if patient agrees to checks - AM BMP        Chronic pain     Significant degenerative changes of L  hip.  Still reporting back pain  and hip pain, agrees that it will be difficult to treat pain and wants hip  surgery outpatient. - Sarna lotion to affected areas for now - Continue gabapentin home dose 600 mg TID - Lidocaine patch daily - Colchicine 0.3 mg in lieu of NSAIDs as patient is allergic - Tramadol 50 mg Q6h PRN - Tizanidine 4 mg BID        Irritation of skin of perianal region     Improving.  Possibly secondary to inadequate hygiene. - Gerhardt's butt cream in use - Sarna lotion as needed - Ensure adequately cleaned after bowel movements        Substance or medication-induced bipolar and related disorder with onset  during intoxication (HCC)     Improving.  Suspect patient is hypomanic and near baseline.   Differential still drug-induced bipolar disorder/psychosis versus  neurosyphilis (cannot exclude both). - May transfer to IP psych when patient completes syphilis 2 week  treatment course, f/u psych recs - Syphilis treatment as per primary problem - Appreciate psych consult, psychiatric medications managed per psych,  note below: -- Continue depakote 1000 mg BID for mania             --Placed order for depakote level 5 days from increase (lab  draw on 8/17 PM) -- Continue 250 mg seroquel at bedtime, 25 mg BID PRN agitation  -- Continue buspirone 10 mg TID for  anxiety -- Continue fluoxetine 20 mg daily for depression -- Continue hydroxyzine 25 mg TID PRN for anxiety -- Continue Backup PRN geodon 2.5 mg PRN if pt refuses PO        Positive RPR test   FEN/GI: Regular, no mIVF PPx: Lovenox Dispo:  Inpatient psych vs home, depends on psych progression after neurosyphilis Tx finished.  Barriers include 2 weeks of IV penicillin until 8/22.  Subjective:  This morning, patient was evaluated with Psych Resident Dr. Weston Settle present.  Patient continues to endorse hip and back pain, but notes tramadol has helped somewhat.  Declines CBGs because he is worried about restriction  of his diet.  Requests orders to be placed to leave unit.  Continues to perseverate on specifics of the medications he is being administered.  Agrees with plan to go home after neurosyphilis course is finished.  Objective: Temp:  [97.8 F (36.6 C)-98.4 F (36.9 C)] 98.1 F (36.7 C) (08/15 0840) Pulse Rate:  [84-100] 100 (08/15 0840) Resp:  [16-20] 17 (08/15 0840) BP: (99-123)/(66-74) 123/67 (08/15 0840) SpO2:  [96 %-97 %] 97 % (08/15 0840)  Physical Exam: General: Age-appropriate, resting comfortably in chair, NAD, WNWD, alert and at baseline. HEENT: MMM. Cardiovascular: Intermittently tachycardic, regular rhythm. Normal S1/S2. No murmurs, rubs, or gallops appreciated. 2+ radial pulses. MSK: Pain with passive ROM hip and back. Psych: No pressured speech, redirectable and interruptible, some paranoid ideation.  No SI/HI.  Laboratory: Most recent CBC Lab Results  Component Value Date   WBC 10.8 (H) 09/07/2022   HGB 10.9 (L) 09/07/2022   HCT 33.7 (L) 09/07/2022   MCV 88.9 09/07/2022   PLT 321 09/07/2022   Most recent BMP    Latest Ref Rng & Units 09/07/2022   12:01 AM  BMP  Glucose 70 - 99 mg/dL 161   BUN 8 - 23 mg/dL 9   Creatinine 0.96 - 0.45 mg/dL 4.09   Sodium 811 - 914 mmol/L 139   Potassium 3.5 - 5.1 mmol/L 3.6   Chloride 98 - 111 mmol/L 107   CO2 22 - 32 mmol/L 22   Calcium 8.9 - 10.3 mg/dL 8.7     Other pertinent labs: - None  New Imaging/Diagnostic Tests: - None  Shaneece Stockburger, MD 09/14/2022, 1:04 PM [facility]  FMTS Intern pager: [phone], text pages welcome Secure chat group [facility] Teaching Service

## 2022-09-15 ENCOUNTER — Encounter (HOSPITAL_COMMUNITY): Payer: Self-pay | Admitting: Family Medicine

## 2022-09-15 DIAGNOSIS — A523 Neurosyphilis, unspecified: Secondary | ICD-10-CM | POA: Diagnosis not present

## 2022-09-15 LAB — BASIC METABOLIC PANEL
Anion gap: 7 (ref 5–15)
BUN: 8 mg/dL (ref 8–23)
CO2: 27 mmol/L (ref 22–32)
Calcium: 8.5 mg/dL — ABNORMAL LOW (ref 8.9–10.3)
Chloride: 102 mmol/L (ref 98–111)
Creatinine, Ser: 0.86 mg/dL (ref 0.61–1.24)
GFR, Estimated: >60 mL/min (ref >60)
Glucose, Bld: 230 mg/dL — ABNORMAL HIGH (ref 70–99)
Potassium: 3.8 mmol/L (ref 3.5–5.1)
Sodium: 136 mmol/L (ref 135–145)

## 2022-09-15 MED ORDER — TRAMADOL HCL 50 MG PO TABS
50.0000 mg | ORAL_TABLET | Freq: Two times a day (BID) | ORAL | Status: DC | PRN
  Administered 2022-09-15 – 2022-09-21 (×9): 50 mg via ORAL
  Filled 2022-09-15 (×11): qty 1

## 2022-09-15 MED ORDER — SALINE SPRAY 0.65 % NA SOLN
1.0000 | NASAL | Status: DC | PRN
  Administered 2022-09-15: 1 via NASAL
  Filled 2022-09-15: qty 44

## 2022-09-15 MED ORDER — EMPAGLIFLOZIN 10 MG PO TABS
10.0000 mg | ORAL_TABLET | Freq: Every day | ORAL | Status: DC
  Administered 2022-09-15 – 2022-09-19 (×5): 10 mg via ORAL
  Filled 2022-09-15 (×5): qty 1

## 2022-09-15 MED ORDER — TRAMADOL HCL 50 MG PO TABS
100.0000 mg | ORAL_TABLET | Freq: Every day | ORAL | Status: DC
  Administered 2022-09-15 – 2022-09-21 (×6): 100 mg via ORAL
  Filled 2022-09-15 (×7): qty 2

## 2022-09-15 MED ORDER — TRAMADOL HCL 50 MG PO TABS
100.0000 mg | ORAL_TABLET | Freq: Every morning | ORAL | Status: DC
  Administered 2022-09-16 – 2022-09-22 (×7): 100 mg via ORAL
  Filled 2022-09-15 (×7): qty 2

## 2022-09-15 NOTE — Progress Notes (Signed)
   09/15/22 1100  Spiritual Encounters  Type of Visit Initial  Care provided to: Patient  Conversation partners present during encounter Nurse  Referral source Patient request  Reason for visit Routine spiritual support   Reason For Visit:    Chaplain went to Pt room in response to a Spiritual Consult that was placed by Pt seeking to talk, and possibly work through some grief.  Interventions:     Chaplain cultivated a relationship of care and support; Facilitated life review and storytelling; Explored spiritual needs and resources  Outcomes:     Pt expressed peace; Pt felt heard and validated in their feelings; Spiritual resources utilized  Assessment:      Needs Pt experiencing frustration; concerned about loss of autonomy  Resources Pt possesses inner emotional strength/confidence  Description Conversation with Pt included much empathetic listening. Pt. spoke on many subjects with themes of grief and loss, but also miracles and success.  Chaplain simply offered a listening ear and a space for Pt to feeling express himself.  Plan:     Pt has asked to speak again in the future.  Chaplain will pass the request along to the appropriate team member assigned to that floor who will follow up.   Chaplain services remain available by Spiritual Consult or for emergent cases, paging [phone]  Chaplain Raelene Bott, MDiv [email or web address] [phone]

## 2022-09-15 NOTE — Progress Notes (Signed)
Daily Progress Note Intern Pager: [phone]  Patient name: Cody Nolan Medical record number: [id number] Date of birth: 17-Jun-1960 Age: 62 y.o. Gender: male  Primary Care Provider: Lockie Mola, MD Consultants: Psych, ID Code Status: FULL  Pt Overview and Major Events to Date:  8/4 - Admitted, confused 8/5 - Mental status improving 8/6 - Treponemal antibodies reactive.  ID consulted, LP planned. 8/7 - No LP per neurology, unable to obtain consent; started empiric Penicillin G IV 8/8 - Now hypomanic, further improved AMS 8/11 - Mental status improving, still hypomanic and upset concerning IVC 8/12 - IVC expired, not renewed  Assessment and Plan: Cody Nolan is a 62 y.o. male with a pertinent PMH of T2DM, HTN, CHF, prior MI, and BPD who presented with altered mentation and ?seizure-like activity when brought in by police from the community and was admitted for substance-induced psychosis and AKI, currently no longer IVC and being treated empirically for neurosyphilis.  Gradually adjusting medications and patient's psychiatric state is continually improving.  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  Treating empirically. - ID on board, appreciate recs - Empiric Pen G 24 MU continuous daily for 14 days (8/8-8/22) - Benzathine Pen G 2.4 MU x1 after completion of IV course  - Will need follow up with non-treponemal test in 6 months, 12 months, 24 months Bipolar 1 disorder (HCC) Improving.  Suspect patient is hypomanic and near baseline.  Psychotic features on admission possibly d/t substance use.  Neurosyphilis may be contributing.  Does still note OCD symptoms. - Discharge home with outpatient psych follow up per Psych recs - Syphilis treatment as per primary problem - Appreciate psych consult, psychiatric medications managed per their note Controlled diabetes mellitus type 2 with complications  (HCC) A1c 6.8% one year ago, now 6.4 this admission. Patient maintains his T2DM is resolved, but fasting AM BMP is elevated to 230 today, patient still refusing CBGs at this time. - CBGs Q4h ordered, but patient refusing - Start Jardiance 10 mg daily Chronic pain Significant degenerative changes of L hip.  Still reporting back pain and hip pain, agrees that it will be difficult to treat pain and wants hip surgery outpatient. - Sarna lotion to affected areas - Continue gabapentin home dose 600 mg TID - Lidocaine patch daily - Stop colchicine as not helping - Tramadol increased to 100 mg BID PRN - Tizanidine 4 mg BID Irritation of skin of perianal region Improving.  Possibly secondary to inadequate hygiene. - Gerhardt's butt cream in use - Sarna lotion as needed - Ensure adequately cleaned after bowel movements GERD (gastroesophageal reflux disease) - Pantoprazole 40 mg daily   FEN/GI: Regular diet, no mIVF PPx: Lovenox Dispo:  Inpatient psych vs home, depends on psych progression after neurosyphilis Tx finished.  Barriers include 2 weeks of IV penicillin until 8/22.  Subjective:  This morning, patient states he feels better overall and is sleeping well.  He states that he continues to have back and hip pain.  Request stronger pain medication.  Agrees to take Jardiance, declines daily CBGs.  Repeatedly states that he trust the medical team.  Continues to disagree about the duration of his neurosyphilis treatment and believes that it should not 8/24.  Continues to perseverate on being told that he had 3 TIAs upon admission, believe that patient may have been under the influence of substances and/or psychosis at the time.  States that family will be visiting soon  and wants to go off unit with them, order placed to leave unit with staff.  Objective: Temp:  [97.6 F (36.4 C)-98.5 F (36.9 C)] 98.5 F (36.9 C) (08/16 0733) Pulse Rate:  [91-100] 100 (08/16 0733) Resp:  [16-20] 16 (08/16  0733) BP: (99-123)/(67-78) 119/78 (08/16 0733) SpO2:  [95 %-100 %] 100 % (08/16 0733) Weight:  [111.1 kg] 111.1 kg (08/15 1332)  Physical Exam: General: Age-appropriate, resting comfortably in chair, NAD, WNWD, alert and at baseline. Cardiovascular: Intermittently tachycardic, regular rhythm. Normal S1/S2. No murmurs, rubs, or gallops appreciated. 2+ radial pulses. Pulmonary: Clear bilaterally to ascultation. No increased WOB, no accessory muscle usage on room air. No wheezes, rales, or crackles. Extremities: No peripheral edema bilaterally. Psych: Mildly pressured speech if agitated, otherwise interruptible, mildly tangential.  Repeats self, perseverates on aspects of medication and care.  Some paranoid ideation.  No SI/HI.  Laboratory: Most recent CBC Lab Results  Component Value Date   WBC 10.8 (H) 09/07/2022   HGB 10.9 (L) 09/07/2022   HCT 33.7 (L) 09/07/2022   MCV 88.9 09/07/2022   PLT 321 09/07/2022   Most recent BMP    Latest Ref Rng & Units 09/15/2022    1:31 AM  BMP  Glucose 70 - 99 mg/dL 130   BUN 8 - 23 mg/dL 8   Creatinine 8.65 - 7.84 mg/dL 6.96   Sodium 295 - 284 mmol/L 136   Potassium 3.5 - 5.1 mmol/L 3.8   Chloride 98 - 111 mmol/L 102   CO2 22 - 32 mmol/L 27   Calcium 8.9 - 10.3 mg/dL 8.5     Other pertinent labs: - None  New Imaging/Diagnostic Tests: - None  Tiana Sivertson, MD 09/15/2022, 8:26 AM [facility]  FMTS Intern pager: [phone], text pages welcome Secure chat group [facility] Teaching Service

## 2022-09-15 NOTE — TOC Progression Note (Addendum)
Transition of Care [facility]) - Progression Note    Patient Details  Name: ROLLIE CILLO MRN: [id number] Date of Birth: [birth year]  Transition of Care [facility]) CM/SW Contact  Carley Hammed, LCSW Phone Number: 09/15/2022, 12:01 PM  Clinical Narrative:    Pt continuing to make improvements. TOC continuing to follow for any needs and disposition planning. TOC is available for further needs.   1:20 MD requested CSW cancel pt's appointment with [facility]. Per appointment notes, pt needed to be there as this was his third strike for no shows. CSW spoke with clinic and they canceled the appointment and will follow to reschedule when appropriate. CSW added info to AVS. TOC is available for further needs.  Expected Discharge Plan: [facility] Barriers to Discharge: Continued Medical Work up  Expected Discharge Plan and Services                                               Social Determinants of Health (SDOH) Interventions SDOH Screenings   Depression (PHQ2-9): Low Risk  (07/05/2021)  Tobacco Use: Medium Risk (09/02/2022)    Readmission Risk Interventions     No data to display

## 2022-09-15 NOTE — Assessment & Plan Note (Signed)
Pantoprazole 40 mg daily

## 2022-09-15 NOTE — Plan of Care (Signed)
  Problem: Education: Goal: Expressions of having a comfortable level of knowledge regarding the disease process will increase Outcome: Progressing   Problem: Coping: Goal: Ability to adjust to condition or change in health will improve Outcome: Progressing Goal: Ability to identify appropriate support needs will improve Outcome: Progressing   Problem: Health Behavior/Discharge Planning: Goal: Compliance with prescribed medication regimen will improve Outcome: Progressing   Problem: Medication: Goal: Risk for medication side effects will decrease Outcome: Progressing   Problem: Clinical Measurements: Goal: Complications related to the disease process, condition or treatment will be avoided or minimized Outcome: Progressing Goal: Diagnostic test results will improve Outcome: Progressing   Problem: Safety: Goal: Verbalization of understanding the information provided will improve Outcome: Progressing   Problem: Self-Concept: Goal: Level of anxiety will decrease Outcome: Progressing Goal: Ability to verbalize feelings about condition will improve Outcome: Progressing   Problem: Education: Goal: Knowledge of General Education information will improve Description: Including pain rating scale, medication(s)/side effects and non-pharmacologic comfort measures Outcome: Progressing   Problem: Health Behavior/Discharge Planning: Goal: Ability to manage health-related needs will improve Outcome: Progressing   Problem: Clinical Measurements: Goal: Ability to maintain clinical measurements within normal limits will improve Outcome: Progressing Goal: Will remain free from infection Outcome: Progressing Goal: Diagnostic test results will improve Outcome: Progressing Goal: Respiratory complications will improve Outcome: Progressing Goal: Cardiovascular complication will be avoided Outcome: Progressing   Problem: Nutrition: Goal: Adequate nutrition will be maintained Outcome:  Progressing   Problem: Coping: Goal: Level of anxiety will decrease Outcome: Progressing   Problem: Elimination: Goal: Will not experience complications related to bowel motility Outcome: Progressing Goal: Will not experience complications related to urinary retention Outcome: Progressing   Problem: Pain Managment: Goal: General experience of comfort will improve Outcome: Progressing   Problem: Safety: Goal: Ability to remain free from injury will improve Outcome: Progressing   Problem: Skin Integrity: Goal: Risk for impaired skin integrity will decrease Outcome: Progressing  Kaizen Ibsen Tamera Stands, RN

## 2022-09-16 DIAGNOSIS — A523 Neurosyphilis, unspecified: Secondary | ICD-10-CM | POA: Diagnosis not present

## 2022-09-16 NOTE — Assessment & Plan Note (Signed)
Significant degenerative changes of L hip.  Still reporting back pain and hip pain, agrees that it will be difficult to treat pain and wants hip surgery outpatient. - Tramadol increased yesterday to 100mg  BID with 50mg  BID PRN for breakthrough pain. He is pleased with this change. - Continue gabapentin home dose 600 mg TID - Lidocaine patch - Tizanidine 4 mg BID

## 2022-09-16 NOTE — Evaluation (Signed)
Occupational Therapy Evaluation Patient Details Name: Cody Nolan MRN: [id number] DOB: 25-Sep-1960 Today's Date: 09/16/2022   History of Present Illness Pt is a 62 year old man brought to [facility] ED on 09/02/22 by [employer] with AMS and bizarre behavior. 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 PMH: DM2, HTN, CHF, MI, Bipolar disorder, OCD,chronic back and hip pain, OA, sleep apnea.   Clinical Impression   Pt typically walks with a cane, drives and is independent in self care. He ambulates in the hall with supervision and cane and has been walking in his room with IV pole and completing ADLs modified independently. Pt tangential and verbose, but pleasant and cooperative. He is aware he needs to complete medication prior to discharging. He was living with an aunt, uncle and cousin, but reports he cannot return there. Will follow acutely to further assess pt's cognition. Do not anticipate he will need post acute OT.       If plan is discharge home, recommend the following: Assistance with cooking/housework;Direct supervision/assist for medications management;Direct supervision/assist for financial management;Assist for transportation    Functional Status Assessment  Patient has had a recent decline in their functional status and/or demonstrates limited ability to make significant improvements in function in a reasonable and predictable amount of time  Equipment Recommendations  None recommended by OT    Recommendations for Other Services       Precautions / Restrictions Precautions Precautions: Fall      Mobility Bed Mobility               General bed mobility comments: seated EOB    Transfers Overall transfer level: Modified independent Equipment used: Straight cane               General transfer comment: slow to rise      Balance Overall balance assessment: Modified Independent                                         ADL either performed or assessed  with clinical judgement   ADL Overall ADL's : Modified independent                                             Vision Baseline Vision/History: 0 No visual deficits Ability to See in Adequate Light: 0 Adequate Patient Visual Report: No change from baseline       Perception         Praxis         Pertinent Vitals/Pain Pain Assessment Pain Assessment: Faces Faces Pain Scale: Hurts little more Pain Location: back Pain Descriptors / Indicators: Aching Pain Intervention(s): Premedicated before session, Repositioned     Extremity/Trunk Assessment Upper Extremity Assessment Upper Extremity Assessment: Overall WFL for tasks assessed (reports B shoulder pain >R, longstanding)   Lower Extremity Assessment Lower Extremity Assessment: Defer to PT evaluation   Cervical / Trunk Assessment Cervical / Trunk Assessment: Other exceptions (chronic back pain, obesity)   Communication Communication Communication: No apparent difficulties   Cognition Arousal: Alert Behavior During Therapy: WFL for tasks assessed/performed                                   General Comments: Pt tangential, highly  focused on his multiple medical issues, past surgeries and chronic pain conditions. He is cooperative and pleasant. Needs further cognitive screening.     General Comments       Exercises     Shoulder Instructions      Home Living Family/patient expects to be discharged to:: Unsure                                 Additional Comments: pt was living with his aunt, uncle and cousin, cannot return      Prior Functioning/Environment Prior Level of Function : Independent/Modified Independent;Driving             Mobility Comments: walked with a cane ADLs Comments: independent, reports he mostly stays in bed unless he is driving twice a day to go through a drive thru for meals        OT Problem List:        OT  Treatment/Interventions:      OT Goals(Current goals can be found in the care plan section) Acute Rehab OT Goals OT Goal Formulation: With patient Time For Goal Achievement: 09/30/22 Potential to Achieve Goals: Good ADL Goals Additional ADL Goal #1: Pt will participate in cognitive screening to assess ability to manage medications.  OT Frequency:      Co-evaluation              AM-PAC OT "6 Clicks" Daily Activity     Outcome Measure Help from another person eating meals?: None Help from another person taking care of personal grooming?: None Help from another person toileting, which includes using toliet, bedpan, or urinal?: None Help from another person bathing (including washing, rinsing, drying)?: None Help from another person to put on and taking off regular upper body clothing?: None Help from another person to put on and taking off regular lower body clothing?: None 6 Click Score: 24   End of Session Equipment Utilized During Treatment: Other (comment) (cane)  Activity Tolerance: Patient tolerated treatment well Patient left: in bed;with call bell/phone within reach  OT Visit Diagnosis: Other symptoms and signs involving cognitive function;Other abnormalities of gait and mobility (R26.89)                Time: 1610-9604 OT Time Calculation (min): 27 min Charges:  OT General Charges $OT Visit: 1 Visit OT Evaluation $OT Eval Low Complexity: 1 Low  Berna Spare, OTR/L Acute Rehabilitation Services Office: [phone]  Evern Bio 09/16/2022, 1:48 PM

## 2022-09-16 NOTE — Assessment & Plan Note (Signed)
RPR positive on admission, confirmed with treponemal antibody test.  Treating empirically as unable to get informed consent for LP. Mental status much improved.  - Empiric Pen G 24 million units continuous daily infusion for 14 days (8/8-8/22) - Benzathine Pen G 2.4 million units x1 after completion of IV course  - Will need follow up with non-treponemal test in 6 months, 12 months, 24 months

## 2022-09-16 NOTE — Assessment & Plan Note (Signed)
Started Jardiance yesterday. Patient voices strong opinion that his diabetes is "resolved" and that he no longer needs insulin. Will monitor his glucose when we collect BMPs. Do not believe this needs to be daily.

## 2022-09-16 NOTE — Progress Notes (Addendum)
Daily Progress Note Intern Pager: [phone]  Patient name: Cody Nolan Medical record number: [id number] Date of birth: 1960/12/19 Age: 62 y.o. Gender: male  Primary Care Provider: Lockie Mola, MD Consultants: Psych, ID Code Status: Full  Pt Overview and Major Events to Date:  8/4 - Admitted, confused 8/6 - Treponemal antibodies reactive.  ID consulted, LP planned. 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 Pertinent PMH/PSH includes T2DM, HTN, CHF, prior MI, BPD.   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   Treating empirically as unable to get informed consent for LP. Mental  status much improved.  - Empiric Pen G 24 million units continuous daily infusion for 14 days  (8/8-8/22) - Benzathine Pen G 2.4 million units x1 after completion of IV course  - Will need follow up with non-treponemal test in 6 months, 12 months, 24  months - Will ask PT/OT to see         Controlled diabetes mellitus type 2 with complications (HCC)     Started Jardiance yesterday. Patient voices strong opinion that his  diabetes is "resolved" and that he no longer needs insulin. Will monitor  his glucose when we collect BMPs. Do not believe this needs to be daily.         Chronic pain     Significant degenerative changes of L hip.  Still reporting back pain  and hip pain, agrees that it will be difficult to treat pain and wants hip  surgery outpatient. - Tramadol increased yesterday to 100mg  BID with 50mg  BID PRN for  breakthrough pain. He is pleased with this change. 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 Seems to be  more or less at baseline from when I've seen him in the office.  - Remains on Seroquel, depakote, prozac, buspar, hydroxyzine - Appreciate psychiatry's assistance with this patient - Will need ongoing outpatient psychiatric follow-up         OCD (obsessive compulsive disorder)    Chronic and Stable Problems:  GERD - Protonix daily  OA - as above CHF  Hx of MI - Continue Metoprolol and Lipitor. No ASA per patient preference.    FEN/GI: Regular PPx: Lovenox Dispo: Anticipate home after completion of neurosyphilis treatment on 8/22 .   Subjective:  Feels well this morning. Recognizes me from hospital day 1, is pleased with how far he's come since then. Slept through the night with the new tramadol regimen.   Objective: Temp:  [98 F (36.7 C)-99.2 F (37.3 C)] 98 F (36.7 C) (08/17 0451) Pulse Rate:  [82-100] 82 (08/17 0451) Resp:  [16-19] 18 (08/17 0451) BP: (91-119)/(59-78) 91/59 (08/17 0451) SpO2:  [94 %-100 %] 96 % (08/17 0451) Physical Exam: General: In good spirits, NAD, speech is not pressured and there is no mood lability today. Much improved compared to when I last saw him. Cardiovascular: Regular rate and rhythm,  without m/r/g Respiratory: Normal WOB on RA, lungs are clear throughout Abdomen: Soft, non-tender, non-distended  Extremities: Without edema or deformity   Laboratory: Most recent CBC Lab Results  Component Value Date   WBC 10.8 (H) 09/07/2022   HGB 10.9 (L) 09/07/2022   HCT 33.7 (L) 09/07/2022   MCV 88.9 09/07/2022   PLT 321 09/07/2022   Most recent BMP    Latest Ref Rng & Units 09/15/2022    1:31 AM  BMP  Glucose 70 - 99 mg/dL 865   BUN 8 - 23 mg/dL 8   Creatinine 7.84 - 6.96 mg/dL 2.95   Sodium 284 - 132 mmol/L 136   Potassium 3.5 - 5.1 mmol/L 3.8   Chloride 98 - 111 mmol/L 102   CO2 22 - 32 mmol/L  27   Calcium 8.9 - 10.3 mg/dL 8.5      Imaging/Diagnostic Tests: No new imaging, tests  Alicia Amel, MD 09/16/2022, 6:10 AM  PGY-3, [facility] FPTS Intern pager: [phone], text pages welcome Secure chat group [facility] Teaching Service

## 2022-09-16 NOTE — Plan of Care (Signed)
  Problem: Education: Goal: Expressions of having a comfortable level of knowledge regarding the disease process will increase Outcome: Progressing   Problem: Coping: Goal: Ability to adjust to condition or change in health will improve Outcome: Progressing Goal: Ability to identify appropriate support needs will improve Outcome: Progressing   Problem: Health Behavior/Discharge Planning: Goal: Compliance with prescribed medication regimen will improve Outcome: Progressing   Problem: Medication: Goal: Risk for medication side effects will decrease Outcome: Progressing   Problem: Clinical Measurements: Goal: Complications related to the disease process, condition or treatment will be avoided or minimized Outcome: Progressing Goal: Diagnostic test results will improve Outcome: Progressing   Problem: Safety: Goal: Verbalization of understanding the information provided will improve Outcome: Progressing   Problem: Self-Concept: Goal: Level of anxiety will decrease Outcome: Progressing Goal: Ability to verbalize feelings about condition will improve Outcome: Progressing   Problem: Education: Goal: Knowledge of General Education information will improve Description: Including pain rating scale, medication(s)/side effects and non-pharmacologic comfort measures Outcome: Progressing   Problem: Health Behavior/Discharge Planning: Goal: Ability to manage health-related needs will improve Outcome: Progressing   Problem: Clinical Measurements: Goal: Ability to maintain clinical measurements within normal limits will improve Outcome: Progressing Goal: Will remain free from infection Outcome: Progressing Goal: Diagnostic test results will improve Outcome: Progressing Goal: Respiratory complications will improve Outcome: Progressing Goal: Cardiovascular complication will be avoided Outcome: Progressing   Problem: Nutrition: Goal: Adequate nutrition will be maintained Outcome:  Progressing   Problem: Coping: Goal: Level of anxiety will decrease Outcome: Progressing   Problem: Elimination: Goal: Will not experience complications related to bowel motility Outcome: Progressing Goal: Will not experience complications related to urinary retention Outcome: Progressing   Problem: Pain Managment: Goal: General experience of comfort will improve Outcome: Progressing   Problem: Safety: Goal: Ability to remain free from injury will improve Outcome: Progressing   Problem: Skin Integrity: Goal: Risk for impaired skin integrity will decrease Outcome: Progressing   

## 2022-09-16 NOTE — Assessment & Plan Note (Addendum)
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 Seems to be more or less at baseline from when I've seen him in the office.  - Remains on Seroquel, depakote, prozac, buspar, hydroxyzine - Appreciate psychiatry's assistance with this patient - Will need ongoing outpatient psychiatric follow-up

## 2022-09-16 NOTE — Assessment & Plan Note (Signed)
RPR positive on admission, confirmed with treponemal antibody test.  Treating empirically as unable to get informed consent for LP. Mental status much improved.  - Empiric Pen G 24 million units continuous daily infusion for 14 days (8/8-8/22) - Benzathine Pen G 2.4 million units x1 after completion of IV course  - Will need follow up with non-treponemal test in 6 months, 12 months, 24 months - Will ask PT/OT to see

## 2022-09-16 NOTE — Progress Notes (Signed)
Patient refused blood glucose check and he was educated.

## 2022-09-16 NOTE — Evaluation (Signed)
Physical Therapy Evaluation Patient Details Name: Cody Nolan MRN: [id number] DOB: December 07, 1960 Today's Date: 09/16/2022  History of Present Illness  Pt is a 62 year old man brought to [facility] ED on 09/02/22 by [employer] with AMS, bizarre behavior and seizure-like activity. 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 PMH: DM2, HTN, CHF, MI, Bipolar disorder, OCD,chronic back and hip pain, OA, sleep apnea.   Clinical Impression  Pt in bed upon arrival of PT, agreeable to evaluation at this time. Prior to admission the pt was independent with mobility, reports mostly in bed due to pain (23 hrs/day) but would leave house 1x daily for fast food meal. The pt now presents with good independence with bed mobility and transfers, is dependent on cane for stability and pain management. The pt will benefit from continued mobility during admission to maintain mobility and strength, but will be safe to d/c without continued follow up when medically stable for d/c. Will attempt to progress mobility and teach exercises/strategies to manage chronic pain.     If plan is discharge home, recommend the following: Help with stairs or ramp for entrance;Supervision due to cognitive status   Can travel by private vehicle        Equipment Recommendations Cane  Recommendations for Other Services       Functional Status Assessment Patient has had a recent decline in their functional status and demonstrates the ability to make significant improvements in function in a reasonable and predictable amount of time.     Precautions / Restrictions Precautions Precautions: Fall Restrictions Weight Bearing Restrictions: No      Mobility  Bed Mobility Overal bed mobility: Modified Independent             General bed mobility comments: seated EOB    Transfers Overall transfer level: Modified independent Equipment used: Straight cane               General transfer comment: slow to rise     Ambulation/Gait Ambulation/Gait assistance: Contact guard assist Gait Distance (Feet): 100 Feet Assistive device: Straight cane Gait Pattern/deviations: Step-through pattern, Decreased stride length Gait velocity: decreased Gait velocity interpretation: <1.31 ft/sec, indicative of household ambulator   General Gait Details: small steps with decreased wt on LLE due to pain, pt reports this is his baseline but limited by pain     Balance Overall balance assessment: Modified Independent                                           Pertinent Vitals/Pain Pain Assessment Pain Assessment: Faces Faces Pain Scale: Hurts little more Pain Location: back Pain Descriptors / Indicators: Aching Pain Intervention(s): Limited activity within patient's tolerance, Monitored during session, Premedicated before session, Repositioned    Home Living Family/patient expects to be discharged to:: Unsure                   Additional Comments: pt was living with his aunt, uncle and cousin, cannot return    Prior Function Prior Level of Function : Independent/Modified Independent;Driving             Mobility Comments: walked with a cane ADLs Comments: independent, reports he mostly stays in bed unless he is driving twice a day to go through a drive thru for meals     Extremity/Trunk Assessment   Upper Extremity Assessment Upper Extremity Assessment: Defer to OT evaluation  Lower Extremity Assessment Lower Extremity Assessment: RLE deficits/detail;LLE deficits/detail RLE Deficits / Details: chronic pain in knee as a result of repeated surgeries and arthritis. Pt able to demo good functional movement despite pain. hx of neuropathy in feet RLE Sensation: history of peripheral neuropathy RLE Coordination: WNL LLE Deficits / Details: chronic hip pain (awaiting replacement) due to arthritis and prior injuries. good functional strength despite pain LLE Sensation: history  of peripheral neuropathy LLE Coordination: WNL    Cervical / Trunk Assessment Cervical / Trunk Assessment: Other exceptions Cervical / Trunk Exceptions: chronic back pain, obesity  Communication   Communication Communication: No apparent difficulties Cueing Techniques: Verbal cues  Cognition Arousal: Alert Behavior During Therapy: WFL for tasks assessed/performed Overall Cognitive Status: No family/caregiver present to determine baseline cognitive functioning                                 General Comments: Pt tangential, highly focused on his multiple medical issues, past surgeries and chronic pain conditions. He is cooperative and pleasant. Needs further cognitive screening.        General Comments General comments (skin integrity, edema, etc.): VSS on RA    Exercises     Assessment/Plan    PT Assessment Patient needs continued PT services  PT Problem List Decreased activity tolerance;Decreased balance;Pain       PT Treatment Interventions Gait training;Stair training;Functional mobility training;Therapeutic activities;Therapeutic exercise;Balance training    PT Goals (Current goals can be found in the Care Plan section)  Acute Rehab PT Goals Patient Stated Goal: return to independence PT Goal Formulation: With patient Time For Goal Achievement: 09/30/22 Potential to Achieve Goals: Fair    Frequency Min 1X/week        AM-PAC PT "6 Clicks" Mobility  Outcome Measure Help needed turning from your back to your side while in a flat bed without using bedrails?: None Help needed moving from lying on your back to sitting on the side of a flat bed without using bedrails?: None Help needed moving to and from a bed to a chair (including a wheelchair)?: A Little Help needed standing up from a chair using your arms (e.g., wheelchair or bedside chair)?: A Little Help needed to walk in hospital room?: A Little Help needed climbing 3-5 steps with a railing? : A  Little 6 Click Score: 20    End of Session   Activity Tolerance: Patient tolerated treatment well;Patient limited by pain Patient left: in bed Nurse Communication: Mobility status PT Visit Diagnosis: Unsteadiness on feet (R26.81);Pain Pain - Right/Left: Left Pain - part of body: Hip    Time: 5784-6962 PT Time Calculation (min) (ACUTE ONLY): 28 min   Charges:   PT Evaluation $PT Eval Low Complexity: 1 Low   PT General Charges $$ ACUTE PT VISIT: 1 Visit         Vickki Muff, PT, DPT   Acute Rehabilitation Department Office [phone] Secure Chat Communication Preferred  Ronnie Derby 09/16/2022, 2:07 PM

## 2022-09-17 DIAGNOSIS — A523 Neurosyphilis, unspecified: Secondary | ICD-10-CM | POA: Diagnosis not present

## 2022-09-17 NOTE — Plan of Care (Signed)
  Problem: Education: Goal: Expressions of having a comfortable level of knowledge regarding the disease process will increase Outcome: Progressing   Problem: Coping: Goal: Ability to adjust to condition or change in health will improve Outcome: Progressing Goal: Ability to identify appropriate support needs will improve Outcome: Progressing   Problem: Health Behavior/Discharge Planning: Goal: Compliance with prescribed medication regimen will improve Outcome: Progressing   Problem: Medication: Goal: Risk for medication side effects will decrease Outcome: Progressing   Problem: Clinical Measurements: Goal: Complications related to the disease process, condition or treatment will be avoided or minimized Outcome: Progressing Goal: Diagnostic test results will improve Outcome: Progressing   Problem: Safety: Goal: Verbalization of understanding the information provided will improve Outcome: Progressing   Problem: Self-Concept: Goal: Level of anxiety will decrease Outcome: Progressing Goal: Ability to verbalize feelings about condition will improve Outcome: Progressing   Problem: Education: Goal: Knowledge of General Education information will improve Description: Including pain rating scale, medication(s)/side effects and non-pharmacologic comfort measures Outcome: Progressing   Problem: Health Behavior/Discharge Planning: Goal: Ability to manage health-related needs will improve Outcome: Progressing   Problem: Clinical Measurements: Goal: Ability to maintain clinical measurements within normal limits will improve Outcome: Progressing Goal: Will remain free from infection Outcome: Progressing Goal: Diagnostic test results will improve Outcome: Progressing Goal: Respiratory complications will improve Outcome: Progressing Goal: Cardiovascular complication will be avoided Outcome: Progressing   Problem: Nutrition: Goal: Adequate nutrition will be maintained Outcome:  Progressing   Problem: Coping: Goal: Level of anxiety will decrease Outcome: Progressing   Problem: Elimination: Goal: Will not experience complications related to bowel motility Outcome: Progressing Goal: Will not experience complications related to urinary retention Outcome: Progressing   Problem: Pain Managment: Goal: General experience of comfort will improve Outcome: Progressing   Problem: Safety: Goal: Ability to remain free from injury will improve Outcome: Progressing   Problem: Skin Integrity: Goal: Risk for impaired skin integrity will decrease Outcome: Progressing   

## 2022-09-17 NOTE — Plan of Care (Signed)
  Problem: Education: Goal: Expressions of having a comfortable level of knowledge regarding the disease process will increase Outcome: Progressing   Problem: Coping: Goal: Ability to adjust to condition or change in health will improve Outcome: Progressing   Problem: Medication: Goal: Risk for medication side effects will decrease Outcome: Progressing   Problem: Clinical Measurements: Goal: Complications related to the disease process, condition or treatment will be avoided or minimized Outcome: Progressing   Problem: Safety: Goal: Verbalization of understanding the information provided will improve Outcome: Progressing   Problem: Education: Goal: Knowledge of General Education information will improve Description: Including pain rating scale, medication(s)/side effects and non-pharmacologic comfort measures Outcome: Progressing   Problem: Clinical Measurements: Goal: Ability to maintain clinical measurements within normal limits will improve Outcome: Progressing Goal: Respiratory complications will improve Outcome: Progressing Goal: Cardiovascular complication will be avoided Outcome: Progressing   Problem: Nutrition: Goal: Adequate nutrition will be maintained Outcome: Progressing   Problem: Elimination: Goal: Will not experience complications related to bowel motility Outcome: Progressing Goal: Will not experience complications related to urinary retention Outcome: Progressing   Problem: Self-Concept: Goal: Level of anxiety will decrease Outcome: Not Progressing   Problem: Clinical Measurements: Goal: Will remain free from infection Outcome: Not Progressing   Problem: Pain Managment: Goal: General experience of comfort will improve Outcome: Not Progressing

## 2022-09-17 NOTE — Assessment & Plan Note (Addendum)
At baseline.   - Remains on Seroquel, depakote, prozac, buspar, hydroxyzine - Appreciate psychiatry's assistance with this patient - Will need ongoing outpatient psychiatric follow-up

## 2022-09-17 NOTE — Progress Notes (Addendum)
Daily Progress Note Intern Pager: [phone]  Patient name: Cody Nolan Medical record number: [id number] Date of birth: [birth year] Age: 62 y.o. Gender: male  Primary Care Provider: Lockie Mola, MD Consultants: Psych, ID Code Status: FULL  Pt Overview and Major Events to Date:  8/4 - Admitted, confused 8/6 - Treponemal antibodies reactive.  ID consulted, LP planned. 8/7 - No LP per neurology, unable to obtain consent; started empiric Penicillin G IV 8/8 - Now hypomanic, improving mental status 8/12 - IVC expired, not renewed  Assessment and Plan: Cody Nolan is a 62 year old male who presented with bizarre behavior and is admitted for treatment of presumed neurosyphilis.  Pertinent PMH includes T2DM, HTN, CHF, prior MI, BPD.  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 Continue to treat empirically (Pt no agreeable to diagnostic LP).  - Empiric Pen G 24 million units continuous daily infusion for 14 days (8/8-8/22) - Benzathine Pen G 2.4 million units x1 after completion of IV course  - Will need follow up with non-treponemal test in 6 months, 12 months, 24 months - PT/OT following acutely Bipolar 1 disorder (HCC) At baseline.   - Remains on Seroquel, depakote, prozac, buspar, hydroxyzine - Appreciate psychiatry's assistance with this patient - Will need ongoing outpatient psychiatric follow-up  Controlled diabetes mellitus type 2 with complications (HCC) Blood glucose remains elevated. Jardiance started 8/16- will give more time to work. Patient expressed opinion that his diabetes "resolved." GI allergy with metformin. Would likely benefit from outpatient follow-up for diabetes management. Consider Insulin, GLP, or other agents if necessary. Chronic pain DJD of L-Hip. Will likely need ortho follow-up outpatient. Pain well controlled today. - Tramadol 100 mg BID with 50mg  BID PRN for breakthrough pain. - Continue gabapentin home dose  600 mg TID - Lidocaine patch - Tizanidine 4 mg BID OCD (obsessive compulsive disorder)   Chronic and Stable Problems:  GERD - Protonix daily  OA - as above CHF  Hx of MI - Continue Metoprolol and Lipitor. No ASA per patient preference.  FEN/GI: Regular PPx: Lovenox Dispo: Home after completion of neurosyphilis treatment on 8/22 .  Subjective:  Sleeping comfortably in bed.  Did not wake.  Objective: Temp:  [97.7 F (36.5 C)-98.4 F (36.9 C)] 98.4 F (36.9 C) (08/17 2027) Pulse Rate:  [87-89] 88 (08/17 2027) Resp:  [18] 18 (08/17 1627) BP: (107-114)/(68-70) 107/70 (08/17 2027) SpO2:  [93 %-95 %] 95 % (08/17 2027) Physical Exam: General: NAD, well-appearing, well-nourished Cardiac: Well-perfused, no edema Respiratory: No respiratory distress, breathing comfortably Skin: warm and dry, no rashes noted on exposed skin  Laboratory: Most recent CBC Lab Results  Component Value Date   WBC 10.8 (H) 09/07/2022   HGB 10.9 (L) 09/07/2022   HCT 33.7 (L) 09/07/2022   MCV 88.9 09/07/2022   PLT 321 09/07/2022   Most recent BMP    Latest Ref Rng & Units 09/15/2022    1:31 AM  BMP  Glucose 70 - 99 mg/dL 865   BUN 8 - 23 mg/dL 8   Creatinine 7.84 - 6.96 mg/dL 2.95   Sodium 284 - 132 mmol/L 136   Potassium 3.5 - 5.1 mmol/L 3.8   Chloride 98 - 111 mmol/L 102   CO2 22 - 32 mmol/L 27   Calcium 8.9 - 10.3 mg/dL 8.5    Imaging/Diagnostic Tests: No new imaging nor tests  Tiffany Kocher, DO 09/17/2022, 5:00 AM  PGY-2, [facility] FPTS Intern pager: [phone], text pages  welcome Secure chat group [facility] Teaching Service

## 2022-09-17 NOTE — Assessment & Plan Note (Addendum)
Blood glucose remains elevated. Jardiance started 8/16- will give more time to work. Patient expressed opinion that his diabetes "resolved." GI allergy with metformin. Would likely benefit from outpatient follow-up for diabetes management. Consider Insulin, GLP, or other agents if necessary.

## 2022-09-17 NOTE — Progress Notes (Signed)
Pt. refusing CBG checks and states "I don't need to have those done". Pt. educated on importance of monitoring blood sugar, however, pt. continues to refuse.

## 2022-09-17 NOTE — Assessment & Plan Note (Addendum)
DJD of L-Hip. Will likely need ortho follow-up outpatient. Pain well controlled today. - Tramadol 100 mg BID with 50mg  BID PRN for breakthrough pain. - Continue gabapentin home dose 600 mg TID - Lidocaine patch - Tizanidine 4 mg BID

## 2022-09-17 NOTE — Progress Notes (Signed)
Patient refused blood glucose check and he was educated.

## 2022-09-18 DIAGNOSIS — A523 Neurosyphilis, unspecified: Secondary | ICD-10-CM | POA: Diagnosis not present

## 2022-09-18 LAB — VALPROIC ACID LEVEL: Valproic Acid Lvl: 56 ug/mL (ref 50.0–100.0)

## 2022-09-18 NOTE — Plan of Care (Signed)
  Problem: Education: Goal: Expressions of having a comfortable level of knowledge regarding the disease process will increase Outcome: Progressing   Problem: Coping: Goal: Ability to adjust to condition or change in health will improve Outcome: Progressing   Problem: Medication: Goal: Risk for medication side effects will decrease Outcome: Progressing   Problem: Clinical Measurements: Goal: Complications related to the disease process, condition or treatment will be avoided or minimized Outcome: Progressing   Problem: Safety: Goal: Verbalization of understanding the information provided will improve Outcome: Progressing   Problem: Self-Concept: Goal: Level of anxiety will decrease Outcome: Progressing   Problem: Education: Goal: Knowledge of General Education information will improve Description: Including pain rating scale, medication(s)/side effects and non-pharmacologic comfort measures Outcome: Progressing   Problem: Clinical Measurements: Goal: Ability to maintain clinical measurements within normal limits will improve Outcome: Progressing Goal: Respiratory complications will improve Outcome: Progressing Goal: Cardiovascular complication will be avoided Outcome: Progressing   Problem: Nutrition: Goal: Adequate nutrition will be maintained Outcome: Progressing   Problem: Elimination: Goal: Will not experience complications related to bowel motility Outcome: Progressing Goal: Will not experience complications related to urinary retention Outcome: Progressing   Problem: Skin Integrity: Goal: Risk for impaired skin integrity will decrease Outcome: Progressing   Problem: Coping: Goal: Level of anxiety will decrease Outcome: Not Progressing

## 2022-09-18 NOTE — Progress Notes (Signed)
Pt refused afternoon BS check

## 2022-09-18 NOTE — Consult Note (Signed)
[name] Consult Evaluation  Service Date: September 18, 2022 LOS:  LOS: 15 days    Primary Psychiatric Diagnoses  Bipolar I Disorder OCD  Assessment  Cody Nolan is a 62 y.o. male admitted medically for 09/02/2022  7:57 PM for altered mental status in the setting of . He carries the psychiatric diagnoses of depression from PCP and has a past medical history of  CHF, diabetes, HTN, MI, sleep apnea. Psychiatry was consulted for Paranoid and tangential, no documented history of psychosis  by Dr. Marisue Humble.  His presentation at admission of pressured, tangential speech, grandiosity, lack of sleep, is most consistent with a diagnosis of mania; given the patient's persistent mania in the absence of intoxicating substances, we have changed our diagnosis to Bipolar I Disorder.   At this time, the psychiatry team feels Mr Cerbone is making good progress and is no longer manic.Not paranoid, grandiose, or pressured. As long as patient stays in hospital to complete his neurosyphilis treatment regimen, the team feels that he is appropriate for outpatient follow-up rather than inpatient hospitalization.  Previous outpatient psychotropic medications include prozac and buspirone and historically he has had a unknown response to these medications. He was semi compliant with medications prior to admission as evidenced by fill hx. Patient is showing good response to the combination of increased seroquel at night and depakote.    Patient is getting a valproic acid level on 8/19 (order placed) in the evening to assess for trough level. As long as it does not come back at super-therapeutic levels, we are content with where he is.  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 Active Problems:   Controlled diabetes mellitus type 2 with complications (HCC)   Bipolar 1 disorder (HCC)   Chronic pain   OCD (obsessive compulsive disorder)   Plan   ## Psychiatric  Medication Recommendations:  -- Continue depakote to 1500 mg BID for mania  -- Placed order for depakote level 5 days from increase (lab draw on 8/19 PM) -- Continue 250 mg seroquel at bedtime, 25 mg BID PRN agitation  -- Continue buspirone 10 mg TID for anxiety -- Continue fluoxetine 60 mg daily for OCD -- Continue hydroxyzine 25 mg TID PRN for anxiety -- Continue Backup PRN geodon 2.5 mg PRN if pt refuses PO  ## Medical Decision Making Capacity:  -- Patient demonstrates clear understanding of costs, risks, and benefits of various medications. Patient appears to have capacity for most decisions.   ## Further Work-up:  -- agree with thorough w/u from primary and neuro teams -- UDS showed positive for benzodiazepines (iatrogenic), amphetamines, cocaine, THC  -- most recent EKG on 8/13 had QtC of 438 in setting of sinus tach -- Pertinent labwork reviewed earlier this admission includes: elevated CK (1003 on 8/4), HgbA1c 6.4, Reactive RPR (titer 1:16), proteinuria, hypocalcemia  and hypokalemia.  ## Disposition:  -- We recommend outpatient management of his Bipolar I Disorder and OCD with close follow-up.   ## Behavioral / Environmental:  --  Utilize compassion and acknowledge the patient's experiences while setting clear and realistic expectations for care.  ## Safety and Observation Level:  - Based on my clinical evaluation, I estimate the patient to be at low risk of self harm in the current setting  - At this time, we recommend q15 minute checks.   Thank you for this consult request. Recommendations have been communicated to the primary team. We will continue to follow at this time.  [name], MD  Psychiatric and Social History   Relevant Aspects of Hospital Course:  Admitted on 09/02/2022 for agitation and bizarre behavior after a witnessed seizure.   Patient Report:  8/19: Met patient early this afternoon with attending Dr Viviano Simas. Patient was pleasant  and agreeable throughout the interview. Patient was far calmer and focused than he's been throughout this admission.  Patient is still chatty, but he's able to read social cues and stop himself.   Throughout the course of the interview, he denied suicidal thoughts and homicidal thoughts. He was not responding to internal stimuli.  Pt voiced concern with his outpatient follow up, scheduling his hip replacement, and the management of his medicines.   Psych ROS:  Negative for poor sleep, lability, speech is linear. Speech still fast, but no longer pressured.   Collateral information:  Pt offered the phone number of his attorney   Psychiatric History:  Information collected from pt, medical record  Prev Dx/Sx: ??? Depression and anxiety from prescriptions, potentially PTSD Current Psych Provider: PCP Home Meds (current): prozac, buspirone Previous Med Trials: unknown Therapy: unknown  Prior ECT: unknown Prior Psych Hospitalization: unknown  Prior Self Harm: denied  Family Psych History: unknown Family Hx suicide: unknown  Social History:  Living Situation: pt unwilling to share where he will live next.  Substance History Drug use: Cocaine, Amphetamines, THC   Exam Findings   Psychiatric Specialty Exam:  Presentation  General Appearance: Appropriate for Environment Eye Contact:Good Speech:Clear and Coherent; Normal Rate Speech Volume:Normal Handedness:Right   Mood and Affect  Mood:Euthymic Affect:Congruent   Thought Process  Thought Processes:Coherent; Goal Directed; Linear Descriptions of Associations:Intact Orientation:Full (Time, Place and Person) Thought Content:Logical Hallucinations:Hallucinations: None  Ideas of Reference:None Suicidal Thoughts:Suicidal Thoughts: No  Homicidal Thoughts:Homicidal Thoughts: No    Sensorium  Memory:Immediate Good; Recent Good; Remote Good  Judgment:Good  Insight:Good   Executive Functions   Concentration:Good  Attention Span:Good  Recall:Good  Fund of Knowledge:Good  Language:Good  Psychomotor Activity  Psychomotor Activity:Psychomotor Activity: Normal   Assets  Assets:Desire for Improvement; Communication Skills  Sleep  Sleep:Sleep: Good   Physical Exam: Vital signs:  Temp:  [98.3 F (36.8 C)-98.5 F (36.9 C)] 98.4 F (36.9 C) (08/19 0749) Pulse Rate:  [90-97] 97 (08/19 0749) Resp:  [17-18] 17 (08/19 0749) BP: (94-123)/(55-75) 123/55 (08/19 0749) SpO2:  [90 %-98 %] 98 % (08/19 0749)  Physical Exam Vitals and nursing note reviewed.  Constitutional:      Appearance: He is obese.  HENT:     Head: Normocephalic and atraumatic.  Eyes:     Conjunctiva/sclera: Conjunctivae normal.  Pulmonary:     Effort: Pulmonary effort is normal.  Musculoskeletal:        General: Tenderness present.  Skin:    General: Skin is warm and dry.  Neurological:     Mental Status: He is alert and oriented to person, place, and time.  Psychiatric:        Behavior: Behavior is cooperative.        Thought Content: Thought content normal.        Cognition and Memory: Cognition and memory normal.        Judgment: Judgment is impulsive.    Blood pressure (!) 123/55, pulse 97, temperature 98.4 F (36.9 C), temperature source Oral, resp. rate 17, height 5\' 8"  (1.727 m), weight 111.1 kg, SpO2 98%. Body mass index is 37.24 kg/m.   Other History   These have been pulled in through the  EMR, reviewed, and updated if appropriate.   Family History:  The patient's family history includes Heart disease in his mother; Hypertension in an other family member.  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   Sleep apnea    Sleep apnea    Tachycardia     Surgical History: Past Surgical History:   Procedure Laterality Date   CHOLECYSTECTOMY     knee  3 knee surgeries    Medications:   Current Facility-Administered Medications:    acetaminophen (TYLENOL) tablet 1,000 mg, 1,000 mg, Oral, Q6H PRN, 1,000 mg at 09/14/22 1606 **OR** acetaminophen (TYLENOL) suppository 650 mg, 650 mg, Rectal, Q6H PRN, Shitarev, Dimitry, MD   atorvastatin (LIPITOR) tablet 80 mg, 80 mg, Oral, Daily, Shelby Mattocks, DO, 80 mg at 09/18/22 0842   busPIRone (BUSPAR) tablet 10 mg, 10 mg, Oral, TID, [name], MD, 10 mg at 09/18/22 4010   camphor-menthol (SARNA) lotion, , Topical, PRN, Shelby Mattocks, DO   divalproex (DEPAKOTE) DR tablet 1,500 mg, 1,500 mg, Oral, Q12H, Keera Altidor, Byrd Hesselbach, MD, 1,500 mg at 09/18/22 0840   empagliflozin (JARDIANCE) tablet 10 mg, 10 mg, Oral, Daily, Lockie Mola, MD, 10 mg at 09/18/22 0842   enoxaparin (LOVENOX) injection 50 mg, 50 mg, Subcutaneous, Daily, Shitarev, Dimitry, MD, 50 mg at 09/18/22 0840   FLUoxetine (PROZAC) capsule 60 mg, 60 mg, Oral, Daily, Mariel Craft, MD, 60 mg at 09/18/22 0841   fluticasone (FLONASE) 50 MCG/ACT nasal spray 2 spray, 2 spray, Each Nare, Daily, Alicia Amel, MD, 2 spray at 09/18/22 0843   gabapentin (NEURONTIN) capsule 600 mg, 600 mg, Oral, TID, Shitarev, Dimitry, MD, 600 mg at 09/18/22 2725   Gerhardt's butt cream, , Topical, BID, Carney Living, MD, Given at 09/18/22 0843   hydrOXYzine (ATARAX) tablet 25 mg, 25 mg, Oral, TID PRN, [name], MD, 25 mg at 09/17/22 2131   lidocaine (LIDODERM) 5 % 1 patch, 1 patch, Transdermal, Daily, Hindel, Leah, MD, 1 patch at 09/18/22 0841   melatonin tablet 5 mg, 5 mg, Oral, QHS PRN, [name], MD, 5 mg at 09/15/22 2146   metoprolol tartrate (LOPRESSOR) tablet 25 mg, 25 mg, Oral, BID, Darnelle Spangle B, MD, 25 mg at 09/18/22 0842   midazolam (VERSED) injection 2 mg, 2 mg, Intravenous, PRN, Alicia Amel, MD    QUEtiapine (SEROQUEL) tablet 25 mg, 25 mg, Oral, BID PRN, 25 mg at 09/17/22 1541 **OR** OLANZapine (ZYPREXA) injection 2.5 mg, 2.5 mg, Intramuscular, BID PRN, [name], MD   Oral care mouth rinse, 15 mL, Mouth Rinse, PRN, Nestor Ramp, MD   pantoprazole (PROTONIX) EC tablet 40 mg, 40 mg, Oral, Daily, Darnelle Spangle B, MD, 40 mg at 09/18/22 0842   [START ON 09/21/2022] penicillin g benzathine (BICILLIN LA) 1200000 UNIT/2ML injection 2.4 Million Units, 2.4 Million Units, Intramuscular, Once, Odette Fraction, MD   penicillin G potassium 12 Million Units in dextrose 5 % 500 mL CONTINUOUS infusion, 12 Million Units, Intravenous, Q12H, Manandhar, Rozell Searing, MD, Last Rate: 41.7 mL/hr at 09/18/22 0409, 12 Million Units at 09/18/22 0409   QUEtiapine (SEROQUEL) tablet 250 mg, 250 mg, Oral, QHS, [name], MD, 250 mg at 09/17/22 2126   sodium chloride (OCEAN) 0.65 % nasal spray 1  spray, 1 spray, Each Nare, PRN, McDiarmid, Leighton Roach, MD, 1 spray at 09/15/22 1218   tamsulosin (FLOMAX) capsule 0.4 mg, 0.4 mg, Oral, Daily, Darnelle Spangle B, MD, 0.4 mg at 09/18/22 0842   tiZANidine (ZANAFLEX) tablet 4 mg, 4 mg, Oral, BID, Shitarev, Dimitry, MD, 4 mg at 09/18/22 0841   traMADol (ULTRAM) tablet 100 mg, 100 mg, Oral, q AM, McDiarmid, Leighton Roach, MD, 100 mg at 09/18/22 0841   traMADol (ULTRAM) tablet 100 mg, 100 mg, Oral, QHS, McDiarmid, Leighton Roach, MD, 100 mg at 09/17/22 2126   traMADol (ULTRAM) tablet 50 mg, 50 mg, Oral, BID PRN, McDiarmid, Leighton Roach, MD, 50 mg at 09/18/22 1610  Allergies: Allergies  Allergen Reactions   Aleve [Naproxen Sodium] Anaphylaxis   Lisinopril Other (See Comments)    COUGH    Metformin And Related Diarrhea

## 2022-09-18 NOTE — Assessment & Plan Note (Signed)
Jardiance started 8/16, will recheck BMP to evaluate blood glucose as still refuses CBGs.  Declines diabetes education.  GI allergy with metformin.  Consider Insulin, GLP, or other agents outpatient. - Recheck BMP tomorrow - Outpatient follow up with PCP

## 2022-09-18 NOTE — Assessment & Plan Note (Signed)
Mental status remains improved. Continue to treat empirically, diagnostic LP consent not obtained.  - Empiric Pen G 24 million units continuous daily infusion for 14 days (8/8-8/22) - Benzathine Pen G 2.4 million units x1 after completion of IV course  - Will need follow up with non-treponemal test in 6 months, 12 months, 24 months - PT/OT following acutely

## 2022-09-18 NOTE — Progress Notes (Signed)
Pt refused AM vitals. Pt stated " let me sleep, I dont want anything done to me this morning".  RN Claris Gower notified.

## 2022-09-18 NOTE — Assessment & Plan Note (Signed)
Will be evaluated, counseled, and treated outpatient with Psych follow up.

## 2022-09-18 NOTE — Assessment & Plan Note (Signed)
DJD of L-Hip.  Will likely need ortho follow-up outpatient.  Pain better controlled today. - Tramadol 100 mg BID with 50mg  BID PRN for breakthrough pain. - Continue gabapentin home dose 600 mg TID - Lidocaine patch - Tizanidine 4 mg BID

## 2022-09-18 NOTE — Assessment & Plan Note (Addendum)
Likely near or at baseline. - Remains on Seroquel, depakote, prozac, buspar, hydroxyzine, per Psych note - Appreciate psychiatry's assistance with this patient - Will need ongoing outpatient psychiatric follow-up

## 2022-09-18 NOTE — Progress Notes (Signed)
Occupational Therapy Treatment and Discharge Patient Details Name: Cody Nolan MRN: [id number] DOB: 07-19-1960 Today's Date: 09/18/2022   History of present illness Pt is a 62 year old man brought to [facility] ED on 09/02/22 by [employer] with AMS, bizarre behavior and seizure-like activity. 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 PMH: DM2, HTN, CHF, MI, Bipolar disorder, OCD,chronic back and hip pain, OA, sleep apnea.   OT comments  Administered pill box test to screen cognition. Pt completed test accurately, but took longer than 5 minutes to complete due to internal distractions and being verbose. Pt with paranoid thoughts of staff stealing his shoes and his Seroquel. Pt pleasant and cooperative. No further OT needs.      If plan is discharge home, recommend the following:      Equipment Recommendations  None recommended by OT    Recommendations for Other Services      Precautions / Restrictions Precautions Precautions: Fall Restrictions Weight Bearing Restrictions: No       Mobility Bed Mobility                    Transfers Overall transfer level: Modified independent                       Balance Overall balance assessment: Modified Independent                                         ADL either performed or assessed with clinical judgement   ADL Overall ADL's : Modified independent                                       General ADL Comments: Administered pill box test.    Extremity/Trunk Assessment              Vision       Perception     Praxis      Cognition Arousal: Alert Behavior During Therapy: WFL for tasks assessed/performed                                   General Comments: paranoid comments, verbose        Exercises      Shoulder Instructions       General Comments      Pertinent Vitals/ Pain       Pain Assessment Pain Assessment: Faces Faces Pain Scale: Hurts little  more Pain Location: back Pain Descriptors / Indicators: Aching  Home Living                                          Prior Functioning/Environment              Frequency           Progress Toward Goals  OT Goals(current goals can now be found in the care plan section)  Progress towards OT goals: Goals met/education completed, patient discharged from OT     Plan      Co-evaluation                 AM-PAC OT "6 Clicks" Daily Activity  Outcome Measure   Help from another person eating meals?: None Help from another person taking care of personal grooming?: None Help from another person toileting, which includes using toliet, bedpan, or urinal?: None Help from another person bathing (including washing, rinsing, drying)?: None Help from another person to put on and taking off regular upper body clothing?: None Help from another person to put on and taking off regular lower body clothing?: None 6 Click Score: 24    End of Session    OT Visit Diagnosis: Other abnormalities of gait and mobility (R26.89)   Activity Tolerance Patient tolerated treatment well   Patient Left in bed;with call bell/phone within reach;with nursing/sitter in room   Nurse Communication          Time: 1610-9604 OT Time Calculation (min): 20 min  Charges: OT General Charges $OT Visit: 1 Visit OT Treatments $Self Care/Home Management : 8-22 mins  Berna Spare, OTR/L Acute Rehabilitation Services Office: [phone]   Evern Bio 09/18/2022, 3:43 PM
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
## 2022-09-18 NOTE — Plan of Care (Signed)
Problem: Education: Goal: Expressions of having a comfortable level of knowledge regarding the disease process will increase 09/18/2022 2225 by Bary Richard, LPN Outcome: Progressing 09/18/2022 2224 by Bary Richard, LPN Outcome: Progressing 09/18/2022 2224 by Bary Richard, LPN Outcome: Progressing   Problem: Education: Goal: Expressions of having a comfortable level of knowledge regarding the disease process will increase 09/18/2022 2225 by Bary Richard, LPN Outcome: Progressing 09/18/2022 2224 by Bary Richard, LPN Outcome: Progressing 09/18/2022 2224 by Bary Richard, LPN Outcome: Progressing   Problem: Coping: Goal: Ability to adjust to condition or change in health will improve 09/18/2022 2225 by Bary Richard, LPN Outcome: Progressing 09/18/2022 2224 by Bary Richard, LPN Outcome: Progressing 09/18/2022 2224 by Bary Richard, LPN Outcome: Progressing Goal: Ability to identify appropriate support needs will improve 09/18/2022 2225 by Bary Richard, LPN Outcome: Progressing 09/18/2022 2224 by Bary Richard, LPN Outcome: Progressing 09/18/2022 2224 by Bary Richard, LPN Outcome: Progressing   Problem: Health Behavior/Discharge Planning: Goal: Compliance with prescribed medication regimen will improve 09/18/2022 2225 by Bary Richard, LPN Outcome: Progressing 09/18/2022 2224 by Bary Richard, LPN Outcome: Progressing 09/18/2022 2224 by Bary Richard, LPN Outcome: Progressing   Problem: Medication: Goal: Risk for medication side effects will decrease 09/18/2022 2225 by Bary Richard, LPN Outcome: Progressing 09/18/2022 2224 by Bary Richard, LPN Outcome: Progressing 09/18/2022 2224 by Bary Richard, LPN Outcome: Progressing   Problem: Clinical Measurements: Goal: Complications related to the disease process, condition or treatment will be avoided or minimized 09/18/2022 2225 by Bary Richard,  LPN Outcome: Progressing 09/18/2022 2224 by Bary Richard, LPN Outcome: Progressing 09/18/2022 2224 by Bary Richard, LPN Outcome: Progressing Goal: Diagnostic test results will improve 09/18/2022 2225 by Bary Richard, LPN Outcome: Progressing 09/18/2022 2224 by Bary Richard, LPN Outcome: Progressing 09/18/2022 2224 by Bary Richard, LPN Outcome: Progressing   Problem: Safety: Goal: Verbalization of understanding the information provided will improve 09/18/2022 2225 by Bary Richard, LPN Outcome: Progressing 09/18/2022 2224 by Bary Richard, LPN Outcome: Progressing 09/18/2022 2224 by Bary Richard, LPN Outcome: Progressing   Problem: Self-Concept: Goal: Level of anxiety will decrease 09/18/2022 2225 by Bary Richard, LPN Outcome: Progressing 09/18/2022 2224 by Bary Richard, LPN Outcome: Progressing 09/18/2022 2224 by Bary Richard, LPN Outcome: Progressing Goal: Ability to verbalize feelings about condition will improve 09/18/2022 2225 by Bary Richard, LPN Outcome: Progressing 09/18/2022 2224 by Bary Richard, LPN Outcome: Progressing 09/18/2022 2224 by Bary Richard, LPN Outcome: Progressing   Problem: Education: Goal: Knowledge of General Education information will improve Description: Including pain rating scale, medication(s)/side effects and non-pharmacologic comfort measures 09/18/2022 2225 by Bary Richard, LPN Outcome: Progressing 09/18/2022 2224 by Bary Richard, LPN Outcome: Progressing 09/18/2022 2224 by Bary Richard, LPN Outcome: Progressing   Problem: Health Behavior/Discharge Planning: Goal: Ability to manage health-related needs will improve 09/18/2022 2225 by Bary Richard, LPN Outcome: Progressing 09/18/2022 2224 by Bary Richard, LPN Outcome: Progressing 09/18/2022 2224 by Bary Richard, LPN Outcome: Progressing   Problem: Clinical Measurements: Goal: Ability to maintain  clinical measurements within normal limits will improve 09/18/2022 2225 by Bary Richard, LPN Outcome: Progressing 09/18/2022 2224 by Bary Richard, LPN Outcome: Progressing 09/18/2022 2224 by Bary Richard, LPN Outcome: Progressing Goal: Will remain free from infection 09/18/2022 2225 by Bary Richard, LPN Outcome: Progressing 09/18/2022 2224 by Bary Richard, LPN Outcome: Progressing 09/18/2022 2224 by Bary Richard, LPN Outcome: Progressing Goal: Diagnostic test results will improve 09/18/2022 2225 by Bary Richard, LPN Outcome: Progressing 09/18/2022 2224 by Bary Richard, LPN Outcome: Progressing 09/18/2022 2224 by Bary Richard, LPN Outcome: Progressing Goal: Respiratory complications will  improve 09/18/2022 2225 by Bary Richard, LPN Outcome: Progressing 09/18/2022 2224 by Bary Richard, LPN Outcome: Progressing 09/18/2022 2224 by Bary Richard, LPN Outcome: Progressing Goal: Cardiovascular complication will be avoided 09/18/2022 2225 by Bary Richard, LPN Outcome: Progressing 09/18/2022 2224 by Bary Richard, LPN Outcome: Progressing 09/18/2022 2224 by Bary Richard, LPN Outcome: Progressing   Problem: Nutrition: Goal: Adequate nutrition will be maintained 09/18/2022 2225 by Bary Richard, LPN Outcome: Progressing 09/18/2022 2224 by Bary Richard, LPN Outcome: Progressing 09/18/2022 2224 by Bary Richard, LPN Outcome: Progressing   Problem: Coping: Goal: Level of anxiety will decrease 09/18/2022 2225 by Bary Richard, LPN Outcome: Progressing 09/18/2022 2224 by Bary Richard, LPN Outcome: Progressing 09/18/2022 2224 by Bary Richard, LPN Outcome: Progressing   Problem: Elimination: Goal: Will not experience complications related to bowel motility 09/18/2022 2225 by Bary Richard, LPN Outcome: Progressing 09/18/2022 2224 by Bary Richard, LPN Outcome: Progressing 09/18/2022 2224 by  Bary Richard, LPN Outcome: Progressing Goal: Will not experience complications related to urinary retention 09/18/2022 2225 by Bary Richard, LPN Outcome: Progressing 09/18/2022 2224 by Bary Richard, LPN Outcome: Progressing 09/18/2022 2224 by Bary Richard, LPN Outcome: Progressing   Problem: Pain Managment: Goal: General experience of comfort will improve 09/18/2022 2225 by Bary Richard, LPN Outcome: Progressing 09/18/2022 2224 by Bary Richard, LPN Outcome: Progressing 09/18/2022 2224 by Bary Richard, LPN Outcome: Progressing   Problem: Safety: Goal: Ability to remain free from injury will improve 09/18/2022 2225 by Bary Richard, LPN Outcome: Progressing 09/18/2022 2224 by Bary Richard, LPN Outcome: Progressing 09/18/2022 2224 by Bary Richard, LPN Outcome: Progressing   Problem: Skin Integrity: Goal: Risk for impaired skin integrity will decrease 09/18/2022 2225 by Bary Richard, LPN Outcome: Progressing 09/18/2022 2224 by Bary Richard, LPN Outcome: Progressing 09/18/2022 2224 by Bary Richard, LPN Outcome: Progressing

## 2022-09-18 NOTE — Progress Notes (Signed)
Pt refused BS check

## 2022-09-19 ENCOUNTER — Encounter: Payer: Medicare HMO | Admitting: Physical Medicine & Rehabilitation

## 2022-09-19 DIAGNOSIS — A523 Neurosyphilis, unspecified: Secondary | ICD-10-CM | POA: Diagnosis not present

## 2022-09-19 LAB — BASIC METABOLIC PANEL
Anion gap: 10 (ref 5–15)
BUN: 12 mg/dL (ref 8–23)
CO2: 28 mmol/L (ref 22–32)
Calcium: 8.5 mg/dL — ABNORMAL LOW (ref 8.9–10.3)
Chloride: 98 mmol/L (ref 98–111)
Creatinine, Ser: 1.01 mg/dL (ref 0.61–1.24)
GFR, Estimated: >60 mL/min (ref >60)
Glucose, Bld: 199 mg/dL — ABNORMAL HIGH (ref 70–99)
Potassium: 3.9 mmol/L (ref 3.5–5.1)
Sodium: 136 mmol/L (ref 135–145)

## 2022-09-19 MED ORDER — EMPAGLIFLOZIN 25 MG PO TABS
25.0000 mg | ORAL_TABLET | Freq: Every day | ORAL | Status: DC
  Administered 2022-09-19 – 2022-09-22 (×4): 25 mg via ORAL
  Filled 2022-09-19 (×5): qty 1

## 2022-09-19 NOTE — Assessment & Plan Note (Signed)
Will be evaluated, counseled, and treated outpatient with Psych follow up.

## 2022-09-19 NOTE — TOC CM/SW Note (Addendum)
Was asked to schedule an appointment at [facility] . Called was told they do not accept patient's insurance. Team aware   Patient wanted [facility] [address] [place], [place], [address] [phone] phone . Called They cannot accept patient due to his insurance. He would need to terminate his Medicaid .   Patient aware would like [facility] [address] [place], [place], [address] [phone] phone . Called they do not have any appointments until End of September / October, they suggested calling DR Evelene Croon , [phone] located on [employer] .  Discussed above with patient . He wants NCM to put Dr Evelene Croon information on AVS and he will call himself . Patient no longer wants NCM to schedule any appointments for him. He is currently calling his lawyer   Patient plans to stay in [place] at discharge , he does not have an address yet. Anticipated discharge date 09/21/22. Patient states his antibiotic will not be complete until 09/23/22   Secure chatted team above

## 2022-09-19 NOTE — Assessment & Plan Note (Addendum)
DJD of L hip.  Will likely need ortho follow-up outpatient, PM&R call him to reschedule after discharge.  Pain better controlled today. - Tramadol 100 mg BID with 50mg  BID PRN for breakthrough pain. - Continue gabapentin home dose 600 mg TID - Lidocaine patch - Tizanidine 4 mg BID

## 2022-09-19 NOTE — Assessment & Plan Note (Addendum)
Mental status remains improved. Continue to treat empirically, diagnostic LP consent not obtained.  - Empiric Pen G 24 million units continuous daily infusion for 14 days (8/8-8/22) - Benzathine Pen G 2.4 million units x1 after completion of IV course  - Will need follow up with non-treponemal test in 6 months, 12 months, 24 months

## 2022-09-19 NOTE — Assessment & Plan Note (Addendum)
BMP with fasting glucose of 199, still refuses CBGs.  Declines diabetes education.  GI allergy with metformin.  Consider Insulin, GLP, or other agents outpatient. - Recheck BMP tomorrow - Outpatient follow up with PCP - Increase Jardiance to 25 mg daily

## 2022-09-19 NOTE — Progress Notes (Signed)
Patient refused morning BS check

## 2022-09-19 NOTE — Consult Note (Cosign Needed)
Cody Nolan Psychiatry Consult Evaluation  Service Date: September 19, 2022 LOS:  LOS: 16 days    Primary Psychiatric Diagnoses  Bipolar I Disorder OCD  Assessment  Cody Nolan is a 62 y.o. male admitted medically for 09/02/2022  7:57 PM for altered mental status in the setting of . He carries the psychiatric diagnoses of depression from PCP and has a past medical history of  CHF, diabetes, HTN, MI, sleep apnea. Psychiatry was consulted for paranoid and tangential, no documented history of psychosis  by Dr. Marisue Humble.  His presentation at admission of pressured, tangential speech, paranoia, grandiosity, lack of sleep, is most consistent with a diagnosis of mania; given the patient's persistent mania in the absence of intoxicating substances, we have changed our diagnosis to Bipolar I Disorder.   At this time, the psychiatry team feels Cody Nolan is making good progress and is no longer manic. He is not paranoid, grandiose, or pressured. The team feels that he is appropriate for outpatient follow-up rather than inpatient hospitalization.  Previous outpatient psychotropic medications include prozac and buspirone and historically he has had a unknown response to these medications. He was semi compliant with medications prior to admission as evidenced by fill hx. Patient is showing good response to the combination of increased seroquel at night and depakote.    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 Active Problems:   Controlled diabetes mellitus type 2 with complications (HCC)   Bipolar 1 disorder (HCC)   Chronic pain   OCD (obsessive compulsive disorder)   Plan   ## Psychiatric Medication Recommendations:  -- Continue depakote to 1500 mg BID for mania -- Continue 250 mg seroquel at bedtime, 25 mg BID PRN agitation  -- Continue buspirone 10 mg TID for anxiety -- Continue fluoxetine 60 mg daily for OCD -- Continue hydroxyzine 25 mg TID PRN for  anxiety -- Continue Backup PRN geodon 2.5 mg PRN if pt refuses PO  ## Medical Decision Making Capacity:  -- Patient demonstrates clear understanding of costs, risks, and benefits of various medications. Patient appears to have capacity for most decisions.   ## Further Work-up:  -- agree with thorough w/u from primary and neuro teams  -- most recent EKG on 8/13 had QtC of 438 in setting of sinus tach -- Pertinent labwork reviewed earlier this admission includes: elevated CK (1003 on 8/4), HgbA1c 6.4, Reactive RPR (titer 1:16), proteinuria, hypocalcemia  and hypokalemia.  ## Disposition:  -- We recommend outpatient management of his Bipolar I Disorder and OCD with close follow-up.   ## Behavioral / Environmental:  --  Utilize compassion and acknowledge the patient's experiences while setting clear and realistic expectations for care.  ## Safety and Observation Level:  - Based on my clinical evaluation, I estimate the patient to be at low risk of self harm in the current setting  - At this time, we recommend q15 minute checks.   Thank you for this consult request. Recommendations have been communicated to the primary team. We will sign off at this time.   [name], MD  Psychiatric and Social History   Relevant Aspects of Hospital Course:  Admitted on 09/02/2022 for agitation and bizarre behavior after a witnessed seizure.   Patient Report:  8/20: Met patient early this morning with medical student MS3 Juliette Alcide. Patient was pleasant and agreeable throughout the interview. Patient was far calmer and focused than he's been throughout this admission.  Patient is still chatty,  but he's able to read social cues and stop himself from interrupting.   Throughout the course of the interview, he denied suicidal thoughts and homicidal thoughts. He was not responding to internal stimuli. Pt denied hallucinations, paranoia, or any of the delusions of reference he made  early on in his hospitalization.  Pt voiced concern with his outpatient follow up, scheduling his hip replacement, and the management of his medicines.  Psych ROS:  Negative for poor sleep, lability, speech is linear. Speech still fast, but no longer pressured.   Collateral information:  Pt offered the phone number of his attorney   Psychiatric History:  Information collected from pt, medical record  Prev Dx/Sx: ??? Depression and anxiety from prescriptions, potentially PTSD Current Psych Provider: PCP Home Meds (current): prozac, buspirone Previous Med Trials: unknown Therapy: unknown  Prior ECT: unknown Prior Psych Hospitalization: unknown  Prior Self Harm: denied  Family Psych History: unknown Family Hx suicide: unknown  Social History:  Living Situation: pt unwilling to share where he will live next.  Substance History Drug use: Cocaine, Amphetamines, THC   Exam Findings   Psychiatric Specialty Exam:  Presentation  General Appearance: Appropriate for Environment Eye Contact:Good Speech:Clear and Coherent; Normal Rate Speech Volume:Normal Handedness:Right   Mood and Affect  Mood:Euthymic Affect:Congruent; Appropriate   Thought Process  Thought Processes:Coherent; Goal Directed; Linear Descriptions of Associations:Intact Orientation:Full (Time, Place and Person) Thought Content:Abstract Reasoning; Logical Hallucinations:Hallucinations: None  Ideas of Reference:None Suicidal Thoughts:Suicidal Thoughts: No  Homicidal Thoughts:Homicidal Thoughts: No    Sensorium  Memory:Immediate Good; Recent Good; Remote Good  Judgment:Good  Insight:Good   Executive Functions  Concentration:Good  Attention Span:Good  Recall:Good  Fund of Knowledge:Good  Language:Good  Psychomotor Activity  Psychomotor Activity:Psychomotor Activity: Normal   Assets  Assets:Desire for Improvement; Communication Skills  Sleep  Sleep:Sleep: Good   Physical  Exam: Vital signs:  Temp:  [98 F (36.7 C)-98.6 F (37 C)] 98.4 F (36.9 C) (08/20 1002) Pulse Rate:  [88-100] 100 (08/20 1002) Resp:  [16-17] 17 (08/20 1002) BP: (97-102)/(50-72) 101/68 (08/20 1002) SpO2:  [94 %-100 %] 95 % (08/20 1002)  Physical Exam Vitals and nursing note reviewed.  Constitutional:      Appearance: He is obese.  HENT:     Head: Normocephalic and atraumatic.  Eyes:     Conjunctiva/sclera: Conjunctivae normal.  Pulmonary:     Effort: Pulmonary effort is normal.  Musculoskeletal:        General: Tenderness present.  Skin:    General: Skin is warm and dry.  Neurological:     Mental Status: He is alert and oriented to person, place, and time.  Psychiatric:        Behavior: Behavior is cooperative.        Thought Content: Thought content normal.        Cognition and Memory: Cognition and memory normal.        Judgment: Judgment is impulsive.    Blood pressure 101/68, pulse 100, temperature 98.4 F (36.9 C), temperature source Oral, resp. rate 17, height 5\' 8"  (1.727 m), weight 111.1 kg, SpO2 95%. Body mass index is 37.24 kg/m.   Other History   These have been pulled in through the EMR, reviewed, and updated if appropriate.   Family History:  The patient's family history includes Heart disease in his mother; Hypertension in an other family member.  Medical History: Past Medical History:  Diagnosis Date   CHF (congestive heart failure) (HCC)    Depression  Diabetes mellitus    Hypertension    Myocardial infarction [facility])    Osteoarthritis, hip, bilateral 06/14/2022   Sleep apnea    Sleep apnea    Tachycardia     Surgical History: Past Surgical History:  Procedure Laterality Date   CHOLECYSTECTOMY     knee  3 knee surgeries    Medications:   Current Facility-Administered Medications:    acetaminophen (TYLENOL) tablet 1,000 mg, 1,000 mg, Oral, Q6H PRN, 1,000 mg at 09/14/22 1606 **OR** acetaminophen (TYLENOL) suppository 650 mg, 650 mg,  Rectal, Q6H PRN, Shitarev, Dimitry, MD   atorvastatin (LIPITOR) tablet 80 mg, 80 mg, Oral, Daily, Shelby Mattocks, DO, 80 mg at 09/19/22 0818   busPIRone (BUSPAR) tablet 10 mg, 10 mg, Oral, TID, [name], MD, 10 mg at 09/19/22 0818   camphor-menthol (SARNA) lotion, , Topical, PRN, Shelby Mattocks, DO   divalproex (DEPAKOTE) DR tablet 1,500 mg, 1,500 mg, Oral, Q12H, Belladonna Lubinski, Byrd Hesselbach, MD, 1,500 mg at 09/19/22 0820   empagliflozin (JARDIANCE) tablet 25 mg, 25 mg, Oral, Daily, Peterson Ao, MD   enoxaparin (LOVENOX) injection 50 mg, 50 mg, Subcutaneous, Daily, Shitarev, Dimitry, MD, 50 mg at 09/19/22 0817   FLUoxetine (PROZAC) capsule 60 mg, 60 mg, Oral, Daily, Mariel Craft, MD, 60 mg at 09/19/22 0817   fluticasone (FLONASE) 50 MCG/ACT nasal spray 2 spray, 2 spray, Each Nare, Daily, Alicia Amel, MD, 2 spray at 09/19/22 2956   gabapentin (NEURONTIN) capsule 600 mg, 600 mg, Oral, TID, Shitarev, Dimitry, MD, 600 mg at 09/19/22 2130   Gerhardt's butt cream, , Topical, BID, Carney Living, MD, Given at 09/19/22 0820   hydrOXYzine (ATARAX) tablet 25 mg, 25 mg, Oral, TID PRN, [name], MD, 25 mg at 09/19/22 0819   lidocaine (LIDODERM) 5 % 1 patch, 1 patch, Transdermal, Daily, Hindel, Leah, MD, 1 patch at 09/18/22 0841   melatonin tablet 5 mg, 5 mg, Oral, QHS PRN, [name], MD, 5 mg at 09/15/22 2146   metoprolol tartrate (LOPRESSOR) tablet 25 mg, 25 mg, Oral, BID, Darnelle Spangle B, MD, 25 mg at 09/18/22 2135   midazolam (VERSED) injection 2 mg, 2 mg, Intravenous, PRN, Alicia Amel, MD   QUEtiapine (SEROQUEL) tablet 25 mg, 25 mg, Oral, BID PRN, 25 mg at 09/19/22 0817 **OR** OLANZapine (ZYPREXA) injection 2.5 mg, 2.5 mg, Intramuscular, BID PRN, [name], MD   Oral care mouth rinse, 15 mL, Mouth Rinse, PRN, Nestor Ramp, MD   pantoprazole (PROTONIX) EC tablet 40 mg, 40 mg, Oral, Daily,  Darnelle Spangle B, MD, 40 mg at 09/19/22 0819   [START ON 09/21/2022] penicillin g benzathine (BICILLIN LA) 1200000 UNIT/2ML injection 2.4 Million Units, 2.4 Million Units, Intramuscular, Once, Odette Fraction, MD   penicillin G potassium 12 Million Units in dextrose 5 % 500 mL CONTINUOUS infusion, 12 Million Units, Intravenous, Q12H, Manandhar, Rozell Searing, MD, Last Rate: 41.7 mL/hr at 09/19/22 0608, 12 Million Units at 09/19/22 0608   QUEtiapine (SEROQUEL) tablet 250 mg, 250 mg, Oral, QHS, Monterrio Gerst, Byrd Hesselbach, MD, 250 mg at 09/18/22 2133   sodium chloride (OCEAN) 0.65 % nasal spray 1 spray, 1 spray, Each Nare, PRN, McDiarmid, Leighton Roach, MD, 1 spray at 09/15/22 1218   tamsulosin (FLOMAX) capsule 0.4 mg, 0.4 mg, Oral, Daily, Darnelle Spangle B, MD, 0.4 mg at 09/19/22 0818   tiZANidine (ZANAFLEX) tablet 4 mg, 4 mg, Oral, BID, Shitarev, Dimitry, MD, 4 mg at 09/19/22 [phone]  traMADol (ULTRAM) tablet 100 mg, 100 mg, Oral, q AM, McDiarmid, Leighton Roach, MD, 100 mg at 09/19/22 0818   traMADol (ULTRAM) tablet 100 mg, 100 mg, Oral, QHS, McDiarmid, Leighton Roach, MD, 100 mg at 09/18/22 2135   traMADol (ULTRAM) tablet 50 mg, 50 mg, Oral, BID PRN, McDiarmid, Leighton Roach, MD, 50 mg at 09/18/22 1613  Allergies: Allergies  Allergen Reactions   Aleve [Naproxen Sodium] Anaphylaxis   Lisinopril Other (See Comments)    COUGH    Metformin And Related Diarrhea

## 2022-09-19 NOTE — Progress Notes (Signed)
Daily Progress Note Intern Pager: [phone]  Patient name: Cody Nolan Medical record number: [id number] Date of birth: 03/13/1960 Age: 62 y.o. Gender: male  Primary Care Provider: Lockie Mola, MD Consultants: Psych (signed off), ID Code Status: FULL  Pt Overview and Major Events to Date:  8/4 - Admitted, confused 8/6 - Treponemal antibodies reactive.  ID consulted, LP planned. 8/7 - No LP per neurology, unable to obtain consent; started empiric Penicillin G IV 8/8 - Now hypomanic, improving mental status 8/12 - IVC expired, not renewed 8/20 - At baseline, Psych signed off with outpatient follow up recs  Assessment and Plan: Cody Nolan is a 62 y.o. male with a pertinent PMH of T2DM and BPD who presented with altered mentation and ?seizure-like activity when brought in by police from the community and was admitted for substance-induced psychosis and AKI.  Now near baseline and no longer IVC, undergoing empiric treatment for neurosyphilis.  Working on establishing outpatient follow up with Psych. 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 Continue to treat empirically, diagnostic LP consent not obtained.  - Empiric Pen G 24 million units continuous daily infusion for 14 days (8/8-8/22) - Benzathine Pen G 2.4 million units x1 after completion of IV course  - Will need follow up with non-treponemal test in 6 months, 12 months, 24 months Bipolar 1 disorder (HCC) Likely near or at baseline.  Per psychiatry, recommend continuing same psychiatric medicines outpatient. - Remains on Seroquel, depakote, prozac, buspar, hydroxyzine, per Psych note - Appreciate psychiatry's assistance with this patient, now signed off - Will need ongoing outpatient psychiatric follow-up, consulted TOC for scheduling a [facility] facility Controlled diabetes mellitus type 2 with complications (HCC) BMP with fasting glucose of 199, still refuses CBGs.  Declines  diabetes education.  GI allergy with metformin.  Consider Insulin, GLP, or other agents outpatient. - Recheck BMP tomorrow - Outpatient follow up with PCP - Increase Jardiance to 25 mg daily Chronic pain DJD of L hip.  Will likely need ortho follow-up outpatient, PM&R call him to reschedule after discharge.  Pain better controlled today. - Tramadol 100 mg BID with 50mg  BID PRN for breakthrough pain. - Continue gabapentin home dose 600 mg TID - Lidocaine patch - Tizanidine 4 mg BID OCD (obsessive compulsive disorder) Will be evaluated, counseled, and treated outpatient with Psych follow up.  Chronic and Stable Problems: GERD: Pantoprazole daily  OA: Pain control as above CHF, Hx MI: Continue metoprolol and atorvastatin.  No ASA per patient preference, restart on discharge.  FEN/GI: Regular, no mIVF PPx: Lovenox Dispo:  Home per Psych with outpatient follow up.  Barriers include 2 weeks of IV penicillin until 8/22.  Subjective:  Morning, patient reports that he is feeling the best he has a long time, sleep is good.  States his medications are working and he is very grateful to the medical team.  Will follow-up outpatient with psychiatry based on the recommendations, states that he wants to look through a list of possible facilities.  Objective: Temp:  [98 F (36.7 C)-98.6 F (37 C)] 98 F (36.7 C) (08/20 0602) Pulse Rate:  [88-93] 88 (08/20 0602) Resp:  [16] 16 (08/19 1635) BP: (97-102)/(50-72) 102/72 (08/20 0602) SpO2:  [94 %-100 %] 100 % (08/20 0602)  Physical Exam: General: Adult male, sitting up in bed, NAD, alert and at baseline. Cardiovascular: Regular rate and rhythm. Normal S1/S2. No murmurs, rubs, or gallops appreciated. 2+ radial pulses. Pulmonary: Clear bilaterally to ascultation.  No increased WOB, no accessory muscle usage on room air. No wheezes, rales, or crackles. Skin: Warm and dry. Psych: Denies SI/HI, interruptible, no pressured speech, not tangential, not  distractible.  No paranoid ideation.  Quite, content mood.  Laboratory: Most recent CBC Lab Results  Component Value Date   WBC 10.8 (H) 09/07/2022   HGB 10.9 (L) 09/07/2022   HCT 33.7 (L) 09/07/2022   MCV 88.9 09/07/2022   PLT 321 09/07/2022   Most recent BMP    Latest Ref Rng & Units 09/19/2022   12:34 AM  BMP  Glucose 70 - 99 mg/dL 161   BUN 8 - 23 mg/dL 12   Creatinine 0.96 - 1.24 mg/dL 0.45   Sodium 409 - 811 mmol/L 136   Potassium 3.5 - 5.1 mmol/L 3.9   Chloride 98 - 111 mmol/L 98   CO2 22 - 32 mmol/L 28   Calcium 8.9 - 10.3 mg/dL 8.5     Other pertinent labs: - None  New Imaging/Diagnostic Tests: - None  Cody Serena, MD 09/19/2022, 8:46 AM [facility]  FMTS Intern pager: [phone], text pages welcome Secure chat group [facility] Teaching Service

## 2022-09-19 NOTE — Progress Notes (Signed)
Physical Therapy Treatment & Discharge Patient Details Name: Cody Nolan MRN: [id number] DOB: 03/26/1960 Today's Date: 09/19/2022   History of Present Illness Pt is a 62 y.o. male admitted 09/02/22 by [employer] with AMS, bizarre behavior and seizure-like activity. Workup for substance-induced psychosis and AKI. Now near baseline and no longer IVC, undergoing empiric treatment for neurosyphilis. PMH includes DM2, HTN, CHF, MI, Bipolar 1 disorder, OCD,chronic back and hip pain, OA, sleep apnea.   PT Comments  Pt progressing with mobility. Pt pleasant and agreeable, remains tangential and verbose with speech though ultimately redirectable. Pt indep with bed mobility and standing, declines ambulation secondary to pain. Session focused on education and therex (HEP provided). Pt reports no further questions or concerns; states he will have somewhere to go at discharge, just doesn't know for sure where yet. Pt would benefit from [facility] for discharge. Acute PT signing off.     If plan is discharge home, recommend the following: Supervision due to cognitive status   Can travel by private vehicle      Yes  Equipment Recommendations  Cane    Recommendations for Other Services       Precautions / Restrictions Precautions Precautions: Fall Restrictions Weight Bearing Restrictions: No     Mobility  Bed Mobility Overal bed mobility: Modified Independent                  Transfers Overall transfer level: Independent Equipment used: None               General transfer comment: indep sit<>stand from EOB 2x without DME    Ambulation/Gait               General Gait Details: pt declined secondary to pain   Stairs             Wheelchair Mobility     Tilt Bed    Modified Rankin (Stroke Patients Only)       Balance Overall balance assessment: Modified Independent                                          Cognition Arousal: Alert Behavior  During Therapy: WFL for tasks assessed/performed Overall Cognitive Status: History of cognitive impairments - at baseline                                 General Comments: pt not paranoid this session, pleasant and cooperative. verbose and tangential with speech, ultimately redirectable        Exercises Other Exercises Other Exercises: [employer] HEP handout (Access Code [email or web address]) provided - SLR, LAQ, sit<>stand, standing calf raise    General Comments General comments (skin integrity, edema, etc.): increased time discussing mobility and therex options regarding chronic pain. [employer] HEP handout provided; pt agreeable to education and demonstration, but declined to perform. pt reports no further concerns regarding current mobility or discharge plan; would like a SPC if possible for discharge      Pertinent Vitals/Pain Pain Assessment Faces Pain Scale: Hurts little more Pain Location: back ("my L5"), L hip, R knee Pain Descriptors / Indicators: Aching, Discomfort Pain Intervention(s): Monitored during session, Limited activity within patient's tolerance    Home Living  Prior Function            PT Goals (current goals can now be found in the care plan section) Acute Rehab PT Goals PT Goal Formulation: All assessment and education complete, DC therapy    Frequency    Min 1X/week      PT Plan      Co-evaluation              AM-PAC PT "6 Clicks" Mobility   Outcome Measure  Help needed turning from your back to your side while in a flat bed without using bedrails?: None Help needed moving from lying on your back to sitting on the side of a flat bed without using bedrails?: None Help needed moving to and from a bed to a chair (including a wheelchair)?: None Help needed standing up from a chair using your arms (e.g., wheelchair or bedside chair)?: None Help needed to walk in hospital room?: A Little Help needed  climbing 3-5 steps with a railing? : A Little 6 Click Score: 22    End of Session   Activity Tolerance: Patient tolerated treatment well;Patient limited by pain Patient left: in bed;with call bell/phone within reach (sitting EOB to eat lunch) Nurse Communication: Mobility status PT Visit Diagnosis: Unsteadiness on feet (R26.81);Pain     Time: 0865-7846 PT Time Calculation (min) (ACUTE ONLY): 20 min  Charges:    $Self Care/Home Management: 8-22 PT General Charges $$ ACUTE PT VISIT: 1 Visit                     Ina Homes, PT, DPT Acute Rehabilitation Services  Personal: Secure Chat Rehab Office: [phone]  Malachy Chamber 09/19/2022, 4:11 PM

## 2022-09-19 NOTE — Assessment & Plan Note (Addendum)
Likely near or at baseline.  Per psychiatry, recommend continuing same psychiatric medicines outpatient. - Remains on Seroquel, depakote, prozac, buspar, hydroxyzine, per Psych note - Appreciate psychiatry's assistance with this patient, now signed off - Will need ongoing outpatient psychiatric follow-up, consulted TOC for scheduling a [facility] facility

## 2022-09-20 DIAGNOSIS — F1994 Other psychoactive substance use, unspecified with psychoactive substance-induced mood disorder: Secondary | ICD-10-CM | POA: Diagnosis not present

## 2022-09-20 DIAGNOSIS — F19929 Other psychoactive substance use, unspecified with intoxication, unspecified: Secondary | ICD-10-CM | POA: Diagnosis not present

## 2022-09-20 DIAGNOSIS — A523 Neurosyphilis, unspecified: Secondary | ICD-10-CM | POA: Diagnosis not present

## 2022-09-20 NOTE — Care Management Important Message (Signed)
Important Message  Patient Details  Name: Cody Nolan MRN: [id number] Date of Birth: [birth year]   Medicare Important Message Given:  Yes     Sherilyn Banker 09/20/2022, 1:52 PM

## 2022-09-20 NOTE — Progress Notes (Signed)
Pt refused morning BS check

## 2022-09-20 NOTE — Progress Notes (Signed)
Daily Progress Note Intern Pager: [phone]  Patient name: Cody Nolan Medical record number: [id number] Date of birth: 14-Mar-1960 Age: 62 y.o. Gender: male  Primary Care Provider: Lockie Mola, MD Consultants: Psych (signed off), ID Code Status: FULL  Pt Overview and Major Events to Date:  8/4 - Admitted, confused 8/6 - Treponemal antibodies reactive.  ID consulted, LP planned. 8/7 - No LP per neurology, unable to obtain consent; started empiric Penicillin G IV 8/8 - Now hypomanic, improving mental status 8/12 - IVC expired, not renewed 8/20 - At baseline 8/21 - Psych signed off with outpatient follow up recs  Assessment and Plan: Cody Nolan is a 62 y.o. male with a pertinent PMH of T2DM and BPD who presented with altered mentation and ?seizure-like activity when brought in by police from the community and was admitted for substance-induced psychosis and AKI.  Now near baseline and no longer IVC, undergoing empiric treatment for neurosyphilis.  Working on establishing outpatient follow up with Psych and ensuring smooth discharge. 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 Continue to treat empirically, diagnostic LP consent not obtained.  - Empiric Pen G 24 million units continuous daily infusion for 14 days (8/8-8/22) - Benzathine Pen G 2.4 million units x1 IM after completion of IV course  - Will need follow up with non-treponemal test in 6 months, 12 months, 24 months Bipolar 1 disorder (HCC) Likely near or at baseline.  Per psychiatry, recommend continuing same psychiatric medicines outpatient. - Remains on Seroquel, depakote, prozac, buspar, hydroxyzine, per Psych note - Appreciate psychiatry's assistance with this patient, now signed off - Will need ongoing outpatient psychiatric follow-up, consulted TOC for scheduling a [facility] facility Controlled diabetes mellitus type 2 with complications (HCC) Declines diabetes education.   GI allergy with metformin.  Consider Insulin, GLP, or other agents outpatient. - Outpatient follow up with PCP - Jardiance 25 mg daily - Discontinued CBGs d/t continuous patient refusal Chronic pain DJD of L hip.  Will likely need ortho follow-up outpatient, PM&R call him to reschedule after discharge.  Pain better controlled today. - Tramadol 100 mg BID with 50mg  BID PRN for breakthrough pain. - Continue gabapentin home dose 600 mg TID - Lidocaine patch - Tizanidine 4 mg BID OCD (obsessive compulsive disorder) Will be evaluated, counseled, and treated outpatient with Psych follow up.  Chronic and Stable Problems: GERD: Pantoprazole daily  OA: Pain control as above CHF, Hx MI: Continue metoprolol and atorvastatin.  No ASA per patient preference, ?restart on discharge  FEN/GI: Regular, no mIVF PPx: Lovenox Dispo:  Home per Psych with outpatient follow up.  Barriers include 2 weeks of IV penicillin until 8/22.  Subjective:  This morning, discussed discharge plans and outpatient follow up with patient.  He expresses concern that he is being kicked out of the hospital on 8/22 when he should leave on 8/24 because "Medicare does not want to pay past 20 days" of him staying in the hospital and that he will not get all of his meds.  Attempted to reassure patient that he will get his full 14 days of IV antibiotics for neurosyphilis and will be medically ready for discharge on 8/22 with good follow up in [facility].  Patient also expresses that he will call Dr. Evelene Croon for Psych outpatient follow up, but would prefer [facility]  Discussed importance of close Psych follow up.  Objective: Temp:  [97.8 F (36.6 C)-98.4 F (36.9 C)] 97.8 F (36.6 C) (08/21  1610) Pulse Rate:  [90-121] 109 (08/21 0656) Resp:  [17-20] 17 (08/21 0515) BP: (90-111)/(42-68) 110/67 (08/21 0656) SpO2:  [91 %-100 %] 91 % (08/21 0656)  Physical Exam: General: Resting comfortably in bed, NAD, alert and at  baseline. Cardiovascular: Regular rate and rhythm. Normal S1/S2. No murmurs, rubs, or gallops appreciated. 2+ radial pulses. Pulmonary: Clear bilaterally to ascultation. No increased WOB, no accessory muscle usage on room air. No wheezes, rales, or crackles. Psych: Not tangential, no pressured speech, organized.  Some paranoid ideation, good insight but not fully understanding of discharge process and dates.  Denies SI/HI.  Laboratory: Most recent CBC Lab Results  Component Value Date   WBC 10.8 (H) 09/07/2022   HGB 10.9 (L) 09/07/2022   HCT 33.7 (L) 09/07/2022   MCV 88.9 09/07/2022   PLT 321 09/07/2022   Most recent BMP    Latest Ref Rng & Units 09/19/2022   12:34 AM  BMP  Glucose 70 - 99 mg/dL 960   BUN 8 - 23 mg/dL 12   Creatinine 4.54 - 1.24 mg/dL 0.98   Sodium 119 - 147 mmol/L 136   Potassium 3.5 - 5.1 mmol/L 3.9   Chloride 98 - 111 mmol/L 98   CO2 22 - 32 mmol/L 28   Calcium 8.9 - 10.3 mg/dL 8.5    Other pertinent labs: - None  New Imaging/Diagnostic Tests: - EKG overnight with QTc 428, no ischemic changes and unchanged from previous  Nial Hawe [name], MD 09/20/2022, 7:05 AM [facility]  FMTS Intern pager: [phone], text pages welcome Secure chat group [facility] Teaching Service

## 2022-09-20 NOTE — Assessment & Plan Note (Signed)
Will be evaluated, counseled, and treated outpatient with Psych follow up.

## 2022-09-20 NOTE — Assessment & Plan Note (Addendum)
Declines diabetes education.  GI allergy with metformin.  Consider Insulin, GLP, or other agents outpatient. - Outpatient follow up with PCP - Jardiance 25 mg daily - Discontinued CBGs d/t continuous patient refusal

## 2022-09-20 NOTE — Assessment & Plan Note (Signed)
Likely near or at baseline.  Per psychiatry, recommend continuing same psychiatric medicines outpatient. - Remains on Seroquel, depakote, prozac, buspar, hydroxyzine, per Psych note - Appreciate psychiatry's assistance with this patient, now signed off - Will need ongoing outpatient psychiatric follow-up, consulted TOC for scheduling a [facility] facility

## 2022-09-20 NOTE — Assessment & Plan Note (Addendum)
Mental status remains improved. Continue to treat empirically, diagnostic LP consent not obtained.  - Empiric Pen G 24 million units continuous daily infusion for 14 days (8/8-8/22) - Benzathine Pen G 2.4 million units x1 IM after completion of IV course  - Will need follow up with non-treponemal test in 6 months, 12 months, 24 months

## 2022-09-20 NOTE — Assessment & Plan Note (Signed)
DJD of L hip.  Will likely need ortho follow-up outpatient, PM&R call him to reschedule after discharge.  Pain better controlled today. - Tramadol 100 mg BID with 50mg  BID PRN for breakthrough pain. - Continue gabapentin home dose 600 mg TID - Lidocaine patch - Tizanidine 4 mg BID

## 2022-09-21 ENCOUNTER — Other Ambulatory Visit (HOSPITAL_COMMUNITY): Payer: Self-pay

## 2022-09-21 ENCOUNTER — Telehealth: Payer: Self-pay | Admitting: Family Medicine

## 2022-09-21 DIAGNOSIS — A523 Neurosyphilis, unspecified: Secondary | ICD-10-CM | POA: Diagnosis not present

## 2022-09-21 MED ORDER — FLUOXETINE HCL 20 MG PO CAPS
60.0000 mg | ORAL_CAPSULE | Freq: Every day | ORAL | 0 refills | Status: DC
  Filled 2022-09-21: qty 100, 33d supply, fill #0

## 2022-09-21 MED ORDER — MELATONIN 5 MG PO TABS
5.0000 mg | ORAL_TABLET | Freq: Every evening | ORAL | 0 refills | Status: DC | PRN
  Filled 2022-09-21: qty 30, 30d supply, fill #0

## 2022-09-21 MED ORDER — HYDROXYZINE HCL 25 MG PO TABS
25.0000 mg | ORAL_TABLET | Freq: Three times a day (TID) | ORAL | 0 refills | Status: DC | PRN
  Filled 2022-09-21: qty 150, 50d supply, fill #0

## 2022-09-21 MED ORDER — DIVALPROEX SODIUM 500 MG PO DR TAB
1500.0000 mg | DELAYED_RELEASE_TABLET | Freq: Two times a day (BID) | ORAL | 0 refills | Status: DC
  Filled 2022-09-21: qty 160, 27d supply, fill #0

## 2022-09-21 MED ORDER — EMPAGLIFLOZIN 25 MG PO TABS
25.0000 mg | ORAL_TABLET | Freq: Every day | ORAL | 0 refills | Status: DC
Start: 2022-09-21 — End: 2022-11-15
  Filled 2022-09-21: qty 30, 30d supply, fill #0

## 2022-09-21 MED ORDER — QUETIAPINE FUMARATE 50 MG PO TABS
250.0000 mg | ORAL_TABLET | Freq: Every day | ORAL | 0 refills | Status: DC
  Filled 2022-09-21: qty 150, 30d supply, fill #0

## 2022-09-21 MED ORDER — QUETIAPINE FUMARATE 25 MG PO TABS
25.0000 mg | ORAL_TABLET | Freq: Two times a day (BID) | ORAL | 0 refills | Status: DC | PRN
  Filled 2022-09-21: qty 60, 30d supply, fill #0

## 2022-09-21 NOTE — Telephone Encounter (Signed)
-----   Message from [employer] sent at 09/21/2022  3:07 PM EDT ----- Regarding: Changing PCP for Patient Hello all,  I have seen this patient for >2 weeks on inpatient and worked with him daily.  We have a good rapport.  He will be important to support extensively outpatient so as to ensure adherence to his psychiatric meds.  Dr. Marsh Dolly and I agree that he would be better served if I became his PCP as she has never met him before.  The patient is also in agreement with me being his PCP and wants this to happen.  Thank you, Dimitry S.

## 2022-09-21 NOTE — Progress Notes (Signed)
Daily Progress Note Intern Pager: [phone]  Patient name: Cody Nolan Medical record number: [id number] Date of birth: 1960-02-14 Age: 62 y.o. Gender: male  Primary Care Provider: Lockie Mola, MD Consultants: Psychiatry Code Status: FULL  Pt Overview and Major Events to Date:  8/4 - Admitted, confused 8/6 - Treponemal antibodies reactive.  ID consulted, LP planned. 8/7 - No LP per neurology, unable to obtain consent; started empiric Penicillin G IV 8/8 - Now hypomanic, improving mental status 8/12 - IVC expired, not renewed 8/20 - At baseline 8/21 - Psych signed off with outpatient follow up recs  Assessment and Plan: Cody Nolan is a 62 y.o. male with a pertinent PMH of T2DM and BPD who presented with altered mentation and ?seizure-like activity when brought in by police from the community and was admitted for substance-induced psychosis and AKI.  Now near baseline and no longer IVC, undergoing empiric treatment for neurosyphilis.  Working on establishing outpatient follow up with Psych and ensuring smooth discharge, EDD 8/23.  Provided information to patient in discharge instructions concerning his PM&R appointment and multiple psych resources he can use to be seen outpatient. 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 Continue to treat empirically, diagnostic LP consent not obtained.  - Empiric Pen G 24 million units continuous daily infusion for 14 days (8/8-8/22) - Benzathine Pen G 2.4 million units x1 IM after completion of IV course  - Will need follow up with non-treponemal test in 6 months, 12 months, 24 months - Keeping one additional day to monitor for reaction to IM penicillin shot Bipolar 1 disorder (HCC) Likely near or at baseline.  Per psychiatry, recommend continuing same psychiatric medicines outpatient. - Remains on Seroquel, depakote, prozac, buspar, hydroxyzine, per Psych note - Appreciate psychiatry's assistance  with this patient, now signed off - Will need ongoing outpatient psychiatric follow-up, will either walk in at [facility] outpatient or will call Dr. Evelene Croon (info in DC instructions) Controlled diabetes mellitus type 2 with complications (HCC) Declines diabetes education.  GI allergy with metformin.  Consider Insulin, GLP, or other agents outpatient. - Outpatient follow up with PCP - Jardiance 25 mg daily - Discontinued CBGs d/t continuous patient refusal Chronic pain DJD of L hip.  Will likely need ortho follow-up outpatient, PM&R will call him to reschedule after discharge.  Pain better controlled today. - Tramadol 100 mg BID with 50mg  BID PRN for breakthrough pain. - Continue gabapentin home dose 600 mg TID - Lidocaine patch - Tizanidine 4 mg BID OCD (obsessive compulsive disorder) Will be evaluated, counseled, and treated outpatient with Psych follow up.  Chronic and Stable Problems: GERD: Pantoprazole daily  OA: Pain control as above CHF, Hx MI: Continue metoprolol and atorvastatin.  No ASA per patient preference, restart on discharge.  FEN/GI: Regular, no mIVF PPx: Lovenox Dispo:  Home 8/23 after observation following completion of penicillin course.  Subjective:  This morning, patient is pleasant and grateful for his care.  States that he is good to discharge tomorrow once he has found "a safe and affordable place to go."  States that he has a housing option lined up for tomorrow.  He says that he feels good on his medicines and he slept well last night.  Feels very steady and rested.  Does report that he has had an allergy to penicillin in the past.  Objective: Temp:  [97.7 F (36.5 C)-98.6 F (37 C)] 98.1 F (36.7 C) (08/22 0902) Pulse Rate:  [  82-93] 93 (08/22 0902) Resp:  [16-20] 18 (08/22 0902) BP: (81-136)/(40-123) 91/67 (08/22 0915) SpO2:  [88 %-97 %] 93 % (08/22 0902)  Physical Exam: General: Age-appropriate, resting comfortably in bed, NAD, alert and at  baseline. Cardiovascular: Regular rate and rhythm. Normal S1/S2. No murmurs, rubs, or gallops appreciated. 2+ radial pulses. Pulmonary: Clear bilaterally to ascultation. No increased WOB, no accessory muscle usage on room air. No wheezes, rales, or crackles. Psych: Not tangential, no pressured speech.  Linear reasoning and organized.  Some mild paranoid ideation and bizarre associations, but no flight of ideas.  Denies SI/HI.  Laboratory: Most recent CBC Lab Results  Component Value Date   WBC 10.8 (H) 09/07/2022   HGB 10.9 (L) 09/07/2022   HCT 33.7 (L) 09/07/2022   MCV 88.9 09/07/2022   PLT 321 09/07/2022   Most recent BMP    Latest Ref Rng & Units 09/19/2022   12:34 AM  BMP  Glucose 70 - 99 mg/dL 161   BUN 8 - 23 mg/dL 12   Creatinine 0.96 - 1.24 mg/dL 0.45   Sodium 409 - 811 mmol/L 136   Potassium 3.5 - 5.1 mmol/L 3.9   Chloride 98 - 111 mmol/L 98   CO2 22 - 32 mmol/L 28   Calcium 8.9 - 10.3 mg/dL 8.5     Other pertinent labs: -None  New Imaging/Diagnostic Tests: -None  Cody Mcelhannon, MD 09/21/2022, 10:47 AM [facility]  FMTS Intern pager: [phone], text pages welcome Secure chat group [facility] Teaching Service

## 2022-09-21 NOTE — Assessment & Plan Note (Addendum)
Mental status remains improved. Continue to treat empirically, diagnostic LP consent not obtained.  - Empiric Pen G 24 million units continuous daily infusion for 14 days (8/8-8/22) - Benzathine Pen G 2.4 million units x1 IM after completion of IV course  - Will need follow up with non-treponemal test in 6 months, 12 months, 24 months - Keeping one additional day to monitor for reaction to IM penicillin shot

## 2022-09-21 NOTE — Discharge Summary (Signed)
Family Medicine Teaching [facility] Discharge Summary  Patient name: Cody Nolan Medical record number: [id number] Date of birth: 05-10-1960 Age: 62 y.o. 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 His hospital course is detailed below:  AMS  BPD with Mania and psychotic features  Substance-induced psychosis Presented after driving car onto sidewalk with agitation and ?seizure-like activity.  EMS gave Versed for seizure and agitation.  Required Geodon 20 mg in ED for agitation.  Labs with Cr 3.23 (baseline 0.7), K 2.9, and BUN 26.  WBC 22.5 with ANC 17.8 but no infectious symptoms.  Normal trop, EKG, CXR, KUB, ammonia, ethanol, CT head.  UDS positive for amphetamines, THC, cocaine, benzodiazepines.  RPR reactive with titer of 1:16, T palladium antibody was positive. GC/chlamydia was negative.  Neurology consulted and recommended EEG initially (though ultimately felt like presentation 2/2 substance use so this was canceled) as well as MRI brain which showed no abnormalities.  Psychiatry consulted and felt presentation 2/2 substance-induced bipolar disorder; they recommended quetiapine at bedtime  and IVC.  Psych continued to adjust medications, going up on dose of quetiapine and adding fluoxetine, PRN hydroxyzine, continuing buspirone.  Adjusted diagnosis to mania and noted OCD.  Patient remained IVC until 8/12 when order expired and was not renewed as patient was returning to baseline and voluntarily remaining hospitalized for neurosyphilis treatment.  Psych signed off on 8/21 with recommendation for outpatient follow up.  Syphilis RPR positive, treponemal antibodies reactive.  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  Unable to establish syphilis infection history with patient, relatives, and HD.  Consulted ID, who requested an LP.  Neurology held that patient did not have capacity, but were unable to reach next of kin for consent and procedure was not performed.  Empiric Penicillin G 24 million units IV continuous started for 14 days per ID (8/8-8/22).  Received 1 dose of Benzathine Pen G 2.4 million units IM on 8/22 after finishing IV course.  T2DM Patient refused CBGs while hospitalized, but BMP did show fasting glucose of 230 and was started on 10 mg Jardiance.  Repeat BMP with glucose of 200 and Jardiance increased to 25 mg.  However, patient's copay for Jardiance is $45 and he refuses metformin due to diarrhea reaction.  With A1c 6.4, insulin not indicated.  Did not feel patient would be checking CBGs at home if prescribed glipizide, so patient not discharged on any diabetes medications.  AKI Initial creatinine of 3.23 which downtrended to ~0.7 (baseline) after fluid resuscitation throughout hospitalization.  UA showed no hemoglobin/bilirubin, no concern for pigment nephropathy.  Rhabdomyolysis CK elevated to 1162 on admission which down trended to 1003 after fluid resuscitation.  Thought to be in relation to recent drug use.  UA without hemoglobin.  Fluids were given cautiously due to history of CHF and unclear EF.  CK trended down  and normalized on day 3 of admission.  Other chronic conditions were medically managed with home medications and formulary alternatives as necessary (HLD, HTN, neuropathy, anxiety).  PCP Follow-up Recommendations: Will need follow up syphilis testing with nontreponemal test in 6, 12, and 24 months Outpatient referral for chronic disease management, need help making appointment with Ortho and PM&R. Started Jardiance inpatient but $45 with copay and has not been taking insulin outpatient as he believes his diabetes is cured; will need follow up for diabetes care and possibly work on CGM with glipizide or GLP-1. Discontinued amlodipine due to persistent soft BPs inpatient. Repeat BMP and CBC on follow up.  Disposition: Home with outpatient Psych follow up planned  Discharge Condition: Stable, no longer manic, at psychiatric baseline  Discharge Exam:  Vitals:   09/22/22 0252 09/22/22 0738  BP: 98/69 96/64  Pulse: 85 87  Resp: 19 18  Temp: 98.5 F (36.9 C) 97.9 F (36.6 C)  SpO2: 92% 93%   Physical Exam: General: Adult male resting in bed, somewhat drowsy after being woken up.  No acute distress.  Alert and at baseline. Eyes: PERRLA, no scleral icterus. ENTM: MMM. Neck: Nonrigid, moving appropriately. Cardiovascular: RRR.  Normal S1/S2.  No murmur/rub/gallops. Respiratory: Normal work of breathing on room air.  CTAB. Gastrointestinal: Nontender to palpation. MSK: Range of motion of back and hips moderately limited by pain.  Otherwise extremities moving appropriate. Extremities: No peripheral edema bilaterally. Neuro: A&O x 4.  No focal neurological deficit. Psych: Content, full range affect.  No pressured speech, not tangential.  No paranoid ideation.  Denies SI/HI.  Significant Procedures: None  Significant Labs and Imaging:  No results for input(s): "WBC", "HGB", "HCT", "PLT" in the last 48 hours. No results for input(s): "NA", "K", "CL", "CO2", "GLUCOSE", "BUN",  "CREATININE", "CALCIUM", "MG", "PHOS", "ALKPHOS", "AST", "ALT", "ALBUMIN", "PROTEIN" in the last 48 hours.  - RPR: Positive, Titer 1:16 - UDS: +Cocaine, +BZAs, +Amphetamines, +THC - EEG: Negative for seizure - CXR: Unremarkable - CT Head/MR Brain: No stroke, acute processes - KUB: Unremarkable  Results/Tests Pending at Time of Discharge: None  Discharge Medications:  Allergies as of 09/22/2022       Reactions   Aleve [naproxen Sodium] Anaphylaxis   Lisinopril Other (See Comments)   COUGH    Metformin And Related Diarrhea        Medication List     STOP taking these medications    amLODipine 10 MG tablet Commonly known as: NORVASC   diphenhydrAMINE 25 MG tablet Commonly known as: BENADRYL   insulin lispro 100 UNIT/ML KwikPen Commonly known as: HumaLOG KwikPen   Semaglutide (1 MG/DOSE) 4 MG/3ML Sopn   Tresiba FlexTouch 200 UNIT/ML FlexTouch Pen Generic drug: insulin degludec       TAKE these medications    acetaminophen 500 MG tablet Commonly known as: TYLENOL Take 1,000 mg by mouth every 6 (six) hours as needed for moderate pain.   atorvastatin 80 MG tablet Commonly known as: LIPITOR Take 1 tablet (80 mg total) by mouth daily.   busPIRone 10 MG tablet Commonly known as: BUSPAR TAKE 1 Tablet BY MOUTH 3 TIMES DAILY   divalproex 500 MG DR tablet Commonly known as: DEPAKOTE Take 3 tablets (1,500 mg total) by mouth every 12 (twelve) hours.   FLUoxetine 20 MG capsule Commonly known as: PROZAC Take 3 capsules (60 mg total) by mouth daily. What changed:  medication strength See the new instructions.   fluticasone 50 MCG/ACT nasal spray Commonly known as: FLONASE Place 2 sprays into both nostrils daily.   gabapentin 600 MG tablet Commonly known as: NEURONTIN Take 1 tablet (600 mg total) by mouth 3 (three) times daily.   hydrOXYzine 25 MG tablet Commonly known as: ATARAX Take 1 tablet (25 mg total) by mouth 3 (three) times daily as needed for  anxiety.   Jardiance 25 MG Tabs tablet Generic drug: empagliflozin Take 1 tablet (25 mg total) by mouth daily.   melatonin 5 MG Tabs Take 1 tablet (5 mg total) by mouth at bedtime as needed.   metoprolol tartrate 25 MG tablet Commonly known as: LOPRESSOR TAKE 1 Tablet  BY MOUTH TWICE DAILY What changed:  when to take this reasons to take this   omeprazole 40 MG capsule Commonly known as: PRILOSEC Take 1 capsule (40 mg total) by mouth daily.   QUEtiapine 50 MG tablet Commonly known as: SEROQUEL Take 5 tablets (250 mg total) by mouth at bedtime.   QUEtiapine 25 MG tablet Commonly known as: SEROQUEL Take 1 tablet (25 mg total) by mouth 2 (two) times daily as needed (agitation, anxiety).   tamsulosin 0.4 MG Caps capsule Commonly known as: FLOMAX Take 1 capsule (0.4 mg total) by mouth daily.   tiZANidine 4 MG tablet Commonly known as: ZANAFLEX Take 1 tablet (4 mg total) by mouth 2 (two) times daily as needed for muscle spasms.   traMADol 50 MG tablet Commonly known as: Ultram Take 1 tablet (50 mg total) by mouth every 6 (six) hours as needed for up to 60 doses. What changed: reasons to take this   Vitamin D (Cholecalciferol) 25 MCG (1000 UT) Caps Take 1 tablet by mouth daily.       Discharge Instructions: Please refer to Patient Instructions section of EMR for full details.  Patient was counseled important signs and symptoms that should prompt return to medical care, changes in medications, dietary instructions, activity restrictions, and follow up appointments.   Follow-Up Appointments: Future Appointments  Date Time Provider Department Center  10/03/2022  2:30 PM Nelia Shi, MD [facility], [facility]   Nelia Shi, MD 09/22/2022, 11:29 AM PGY-1, [facility]

## 2022-09-21 NOTE — Assessment & Plan Note (Signed)
Declines diabetes education.  GI allergy with metformin.  Consider Insulin, GLP, or other agents outpatient. - Outpatient follow up with PCP - Jardiance 25 mg daily - Discontinued CBGs d/t continuous patient refusal

## 2022-09-21 NOTE — TOC Benefit Eligibility Note (Addendum)
Patient Product/process development scientist completed.    The patient is insured through [employer]. Patient has Medicare and is not eligible for a copay card, but may be able to apply for patient assistance, if available.    Ran test claim for Jardiance 25 mg and the current 30 day co-pay is $45.00.  Ran test claim for Ozempic (0.25 or 0.5 mg/dose) 2 mg/3 ml Sopn and the current 30 day co-pay is $45.00.  This test claim was processed through [facility]- copay amounts may vary at other pharmacies due to pharmacy/plan contracts, or as the patient moves through the different stages of their insurance plan.     Roland Earl, CPHT Pharmacy Technician III Certified Patient Advocate [facility] Pharm[facility] Patient Advocate Team Direct Number: [phone]  Fax: [phone]

## 2022-09-21 NOTE — Telephone Encounter (Signed)
 Switch approved

## 2022-09-21 NOTE — Assessment & Plan Note (Signed)
DJD of L hip.  Will likely need ortho follow-up outpatient, PM&R will call him to reschedule after discharge.  Pain better controlled today. - Tramadol 100 mg BID with 50mg  BID PRN for breakthrough pain. - Continue gabapentin home dose 600 mg TID - Lidocaine patch - Tizanidine 4 mg BID

## 2022-09-21 NOTE — Assessment & Plan Note (Signed)
Will be evaluated, counseled, and treated outpatient with Psych follow up.

## 2022-09-21 NOTE — Assessment & Plan Note (Addendum)
Likely near or at baseline.  Per psychiatry, recommend continuing same psychiatric medicines outpatient. - Remains on Seroquel, depakote, prozac, buspar, hydroxyzine, per Psych note - Appreciate psychiatry's assistance with this patient, now signed off - Will need ongoing outpatient psychiatric follow-up, will either walk in at [facility] outpatient or will call Dr. Evelene Croon (info in DC instructions)

## 2022-09-22 DIAGNOSIS — A523 Neurosyphilis, unspecified: Secondary | ICD-10-CM | POA: Diagnosis not present

## 2022-09-22 NOTE — Plan of Care (Signed)
  Problem: Education: Goal: Expressions of having a comfortable level of knowledge regarding the disease process will increase Outcome: Adequate for Discharge   Problem: Coping: Goal: Ability to adjust to condition or change in health will improve Outcome: Adequate for Discharge Goal: Ability to identify appropriate support needs will improve Outcome: Adequate for Discharge   Problem: Health Behavior/Discharge Planning: Goal: Compliance with prescribed medication regimen will improve Outcome: Adequate for Discharge   Problem: Medication: Goal: Risk for medication side effects will decrease Outcome: Adequate for Discharge   Problem: Clinical Measurements: Goal: Complications related to the disease process, condition or treatment will be avoided or minimized Outcome: Adequate for Discharge Goal: Diagnostic test results will improve Outcome: Adequate for Discharge   Problem: Safety: Goal: Verbalization of understanding the information provided will improve Outcome: Adequate for Discharge   Problem: Self-Concept: Goal: Level of anxiety will decrease Outcome: Adequate for Discharge Goal: Ability to verbalize feelings about condition will improve Outcome: Adequate for Discharge   Problem: Education: Goal: Knowledge of General Education information will improve Description: Including pain rating scale, medication(s)/side effects and non-pharmacologic comfort measures Outcome: Adequate for Discharge   Problem: Health Behavior/Discharge Planning: Goal: Ability to manage health-related needs will improve Outcome: Adequate for Discharge   Problem: Clinical Measurements: Goal: Ability to maintain clinical measurements within normal limits will improve Outcome: Adequate for Discharge Goal: Will remain free from infection Outcome: Adequate for Discharge Goal: Diagnostic test results will improve Outcome: Adequate for Discharge Goal: Respiratory complications will improve Outcome:  Adequate for Discharge Goal: Cardiovascular complication will be avoided Outcome: Adequate for Discharge   Problem: Nutrition: Goal: Adequate nutrition will be maintained Outcome: Adequate for Discharge   Problem: Coping: Goal: Level of anxiety will decrease Outcome: Adequate for Discharge   Problem: Elimination: Goal: Will not experience complications related to bowel motility Outcome: Adequate for Discharge Goal: Will not experience complications related to urinary retention Outcome: Adequate for Discharge   Problem: Pain Managment: Goal: General experience of comfort will improve Outcome: Adequate for Discharge   Problem: Safety: Goal: Ability to remain free from injury will improve Outcome: Adequate for Discharge   Problem: Skin Integrity: Goal: Risk for impaired skin integrity will decrease Outcome: Adequate for Discharge

## 2022-09-22 NOTE — Plan of Care (Signed)
  Problem: Education: Goal: Expressions of having a comfortable level of knowledge regarding the disease process will increase Outcome: Progressing   Problem: Coping: Goal: Ability to adjust to condition or change in health will improve Outcome: Progressing   Problem: Medication: Goal: Risk for medication side effects will decrease Outcome: Progressing   Problem: Clinical Measurements: Goal: Complications related to the disease process, condition or treatment will be avoided or minimized Outcome: Progressing   Problem: Safety: Goal: Verbalization of understanding the information provided will improve Outcome: Progressing   Problem: Self-Concept: Goal: Level of anxiety will decrease Outcome: Progressing Goal: Ability to verbalize feelings about condition will improve Outcome: Progressing   Problem: Education: Goal: Knowledge of General Education information will improve Description: Including pain rating scale, medication(s)/side effects and non-pharmacologic comfort measures Outcome: Progressing   Problem: Clinical Measurements: Goal: Ability to maintain clinical measurements within normal limits will improve Outcome: Progressing Goal: Will remain free from infection Outcome: Progressing Goal: Respiratory complications will improve Outcome: Progressing Goal: Cardiovascular complication will be avoided Outcome: Progressing   Problem: Elimination: Goal: Will not experience complications related to bowel motility Outcome: Progressing Goal: Will not experience complications related to urinary retention Outcome: Progressing   Problem: Safety: Goal: Ability to remain free from injury will improve Outcome: Progressing   Problem: Skin Integrity: Goal: Risk for impaired skin integrity will decrease Outcome: Progressing   Problem: Coping: Goal: Level of anxiety will decrease Outcome: Not Progressing   Problem: Pain Managment: Goal: General experience of comfort will  improve Outcome: Not Progressing

## 2022-09-22 NOTE — TOC Transition Note (Signed)
Transition of Care [facility]) - CM/SW Discharge Note   Patient Details  Name: Cody Nolan MRN: [id number] Date of Birth: [birth year]  Transition of Care [facility]) CM/SW Contact:  Lockie Pares, RN Phone Number: 09/22/2022, 11:18 AM   Clinical Narrative:     Change to new PCP Dr [name]. Called office to make appt no answer. Placed information on patient AVS to call Monday to make appt post hospitalization.  No further needs ID  Final next level of care: Home/Self Care Barriers to Discharge: No Barriers Identified   Patient Goals and CMS Choice      Discharge Placement                         Discharge Plan and Services Additional resources added to the After Visit Summary for                                       Social Determinants of Health (SDOH) Interventions SDOH Screenings   Depression (PHQ2-9): Low Risk  (07/05/2021)  Tobacco Use: Medium Risk (09/02/2022)     Readmission Risk Interventions     No data to display

## 2022-09-25 ENCOUNTER — Telehealth: Payer: Self-pay

## 2022-09-25 NOTE — Transitions of Care (Post Inpatient/ED Visit) (Unsigned)
   09/25/2022  Name: Cody Nolan MRN: [id number] DOB: May 27, 1960  Today's TOC FU Call Status: Today's TOC FU Call Status:: Unsuccessful Call (1st Attempt) Unsuccessful Call (1st Attempt) Date: 09/25/22  Attempted to reach the patient regarding the most recent Inpatient/ED visit.  Follow Up Plan: Additional outreach attempts will be made to reach the patient to complete the Transitions of Care (Post Inpatient/ED visit) call.   Signature Karena Addison, LPN [facility] Nurse Health Advisor Direct Dial [phone]

## 2022-09-26 NOTE — Transitions of Care (Post Inpatient/ED Visit) (Unsigned)
   09/26/2022  Name: Cody Nolan MRN: [id number] DOB: November 03, 1960  Today's TOC FU Call Status: Today's TOC FU Call Status:: Unsuccessful Call (2nd Attempt) Unsuccessful Call (1st Attempt) Date: 09/25/22 Unsuccessful Call (2nd Attempt) Date: 09/26/22  Attempted to reach the patient regarding the most recent Inpatient/ED visit.  Follow Up Plan: Additional outreach attempts will be made to reach the patient to complete the Transitions of Care (Post Inpatient/ED visit) call.   Signature Karena Addison, LPN [facility] Nurse Health Advisor Direct Dial [phone]

## 2022-09-27 NOTE — Transitions of Care (Post Inpatient/ED Visit) (Signed)
   09/27/2022  Name: Cody Nolan MRN: [id number] DOB: 1960/03/29  Today's TOC FU Call Status: Today's TOC FU Call Status:: Unsuccessful Call (3rd Attempt) Unsuccessful Call (1st Attempt) Date: 09/25/22 Unsuccessful Call (2nd Attempt) Date: 09/26/22 Unsuccessful Call (3rd Attempt) Date: 09/27/22  Attempted to reach the patient regarding the most recent Inpatient/ED visit.  Follow Up Plan: No further outreach attempts will be made at this time. We have been unable to contact the patient.  Signature Karena Addison, LPN [facility] Nurse Health Advisor Direct Dial [phone]

## 2022-10-03 ENCOUNTER — Ambulatory Visit: Payer: Self-pay | Admitting: Family Medicine

## 2022-10-03 NOTE — Assessment & Plan Note (Deleted)
-   Will need follow up syphilis testing with nontreponemal test in 6, 12, and 24 months

## 2022-10-03 NOTE — Progress Notes (Deleted)
   SUBJECTIVE:   CHIEF COMPLAINT / HPI:  No chief complaint on file.  RUDYARD MCNEMAR is a 62 y.o. male with a pertinent past medical history of syphilis s/p treatment,  presenting to the clinic for hospital follow up after hospitalization for altered mental status thought to be due to mania in the setting of substance use versus untreated BPD.  BPD  PTSD   Syphilis During the patient's hospitalization, patient had positive RPR and confirmatory treponemal antibody test.  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     T2DM Last A1c 6.4 on 09/03/2022 Home CBGs: *** Medications: None Adherence: Discontinued home insulin and semaglutide himself Eye exam: Due Foot exam: Due Microalbumin: *** Statin: Atorvastatin 80 mg *** symptoms of hypoglycemia, polyuria, polydipsia, numbness extremities, foot ulcers/trauma   Outpatient referral for chronic disease management, need help making appointment with Ortho and PM&R. Started Jardiance inpatient but $45 with copay and has not been taking insulin outpatient as he believes his diabetes is cured; will need follow up for diabetes care and possibly work on CGM with glipizide or GLP-1. Discontinued amlodipine due to persistent soft BPs inpatient. Repeat BMP and CBC on follow up.  PERTINENT PMH / PSH: ***   OBJECTIVE:   There were no vitals taken for this visit. Physical Exam General: Age-appropriate, resting comfortably in chair, NAD, alert and at baseline. HEENT:  Head: Normocephalic, atraumatic. No tenderness to percussion over sinuses. Eyes: PERRLA. No conjunctival erythema or scleral injections. Ears: TMs non-bulging and non-erythematous bilaterally. No erythema of external ear canal. No cerumen impaction. Nose: Non-erythematous turbinates. No rhinorrhea. Mouth/Oral: Clear, no tonsillar exudate. MMM. Neck:  Supple. No LAD. Cardiovascular: Regular rate and rhythm. Normal S1/S2. No murmurs, rubs, or gallops appreciated. 2+ radial pulses. Pulmonary: Clear bilaterally to ascultation. No increased WOB, no accessory muscle usage. No wheezes, crackles, or rhonchi. Abdominal: No tenderness to deep or light palpation. No rebound or guarding. No HSM. Skin: Warm and dry. Extremities: No peripheral edema bilaterally. Capillary refill <2 seconds.  No results found for this or any previous visit (from the past 48 hour(s)).     07/05/2021    1:53 PM  Depression screen PHQ 2/9  Decreased Interest 0  Down, Depressed, Hopeless 0  PHQ - 2 Score 0  Altered sleeping 0  Tired, decreased energy 0  Change in appetite 0  Feeling bad or failure about yourself  0  Trouble concentrating 0  Moving slowly or fidgety/restless 0  Suicidal thoughts 0  PHQ-9 Score 0     ASSESSMENT/PLAN:   No problem-specific Assessment & Plan notes found for this encounter.   No follow-ups on file.  Ciarra Braddy [name], MD [facility]

## 2022-10-16 ENCOUNTER — Other Ambulatory Visit: Payer: Self-pay

## 2022-10-16 ENCOUNTER — Telehealth: Payer: Self-pay | Admitting: Pharmacist

## 2022-10-16 ENCOUNTER — Other Ambulatory Visit: Payer: Self-pay | Admitting: Family Medicine

## 2022-10-16 DIAGNOSIS — M545 Low back pain, unspecified: Secondary | ICD-10-CM

## 2022-10-16 MED ORDER — INSULIN PEN NEEDLE 31G X 5 MM MISC
1.0000 | 99 refills | Status: AC | PRN
Start: 2022-10-16 — End: ?
  Filled 2022-10-16: qty 100, 30d supply, fill #0

## 2022-10-16 MED FILL — Gabapentin Tab 600 MG: ORAL | 30 days supply | Qty: 90 | Fill #5 | Status: AC

## 2022-10-16 MED FILL — Tizanidine HCl Tab 4 MG (Base Equivalent): ORAL | 30 days supply | Qty: 60 | Fill #5 | Status: AC

## 2022-10-16 NOTE — Telephone Encounter (Signed)
Patient contacted our office to request pen needle tips for use with  Tresiba (insulin degludec).   Since last contact patient reports he is doing very well.   Agreed to help with pen needle tip dispensing for use of Tresiba.    Total time with patient call and documentation of interaction: 11 minutes.  F/U Phone call planned: None.

## 2022-10-16 NOTE — Telephone Encounter (Signed)
Reviewed and agree with Dr Macky Lower plan.

## 2022-10-17 ENCOUNTER — Other Ambulatory Visit: Payer: Self-pay

## 2022-10-20 ENCOUNTER — Other Ambulatory Visit: Payer: Self-pay

## 2022-11-02 ENCOUNTER — Other Ambulatory Visit (HOSPITAL_COMMUNITY): Payer: Self-pay

## 2022-11-02 ENCOUNTER — Other Ambulatory Visit: Payer: Self-pay

## 2022-11-15 ENCOUNTER — Ambulatory Visit: Payer: Medicare HMO | Admitting: Family Medicine

## 2022-11-15 ENCOUNTER — Other Ambulatory Visit: Payer: Self-pay

## 2022-11-15 ENCOUNTER — Encounter: Payer: Self-pay | Admitting: Family Medicine

## 2022-11-15 VITALS — BP 137/89 | HR 88 | Ht 67.0 in | Wt 238.0 lb

## 2022-11-15 DIAGNOSIS — E7800 Pure hypercholesterolemia, unspecified: Secondary | ICD-10-CM

## 2022-11-15 DIAGNOSIS — M1610 Unilateral primary osteoarthritis, unspecified hip: Secondary | ICD-10-CM | POA: Diagnosis not present

## 2022-11-15 DIAGNOSIS — N179 Acute kidney failure, unspecified: Secondary | ICD-10-CM

## 2022-11-15 DIAGNOSIS — A523 Neurosyphilis, unspecified: Secondary | ICD-10-CM

## 2022-11-15 DIAGNOSIS — N401 Enlarged prostate with lower urinary tract symptoms: Secondary | ICD-10-CM

## 2022-11-15 DIAGNOSIS — M545 Low back pain, unspecified: Secondary | ICD-10-CM

## 2022-11-15 DIAGNOSIS — I10 Essential (primary) hypertension: Secondary | ICD-10-CM

## 2022-11-15 DIAGNOSIS — F319 Bipolar disorder, unspecified: Secondary | ICD-10-CM

## 2022-11-15 DIAGNOSIS — F419 Anxiety disorder, unspecified: Secondary | ICD-10-CM

## 2022-11-15 DIAGNOSIS — R3916 Straining to void: Secondary | ICD-10-CM

## 2022-11-15 MED ORDER — FLUOXETINE HCL 20 MG PO CAPS
60.0000 mg | ORAL_CAPSULE | Freq: Every day | ORAL | 1 refills | Status: DC
  Filled 2022-11-15: qty 270, 90d supply, fill #0
  Filled 2023-01-23 – 2023-03-28 (×2): qty 270, 90d supply, fill #1

## 2022-11-15 MED ORDER — TRAMADOL HCL 50 MG PO TABS
ORAL_TABLET | ORAL | 5 refills | Status: DC
Start: 2022-11-15 — End: 2023-05-29
  Filled 2022-11-15: qty 120, 30d supply, fill #0
  Filled 2022-12-18: qty 120, 30d supply, fill #1
  Filled 2023-01-23: qty 120, 30d supply, fill #2
  Filled 2023-02-27: qty 120, 30d supply, fill #3
  Filled 2023-03-28: qty 120, 30d supply, fill #4
  Filled 2023-04-30: qty 120, 30d supply, fill #5

## 2022-11-15 MED ORDER — BUSPIRONE HCL 10 MG PO TABS
10.0000 mg | ORAL_TABLET | Freq: Three times a day (TID) | ORAL | 3 refills | Status: DC
Start: 2022-11-15 — End: 2023-04-30
  Filled 2022-11-15: qty 90, 30d supply, fill #0
  Filled 2023-01-23: qty 90, 30d supply, fill #1
  Filled 2023-02-22 – 2023-02-26 (×2): qty 90, 30d supply, fill #2
  Filled 2023-03-28: qty 90, 30d supply, fill #3

## 2022-11-15 MED ORDER — OMEPRAZOLE 40 MG PO CPDR
40.0000 mg | DELAYED_RELEASE_CAPSULE | Freq: Every day | ORAL | 3 refills | Status: AC
  Filled 2022-11-15: qty 90, 90d supply, fill #0

## 2022-11-15 MED ORDER — DIVALPROEX SODIUM 500 MG PO DR TAB
1500.0000 mg | DELAYED_RELEASE_TABLET | Freq: Two times a day (BID) | ORAL | 0 refills | Status: DC
Start: 2022-11-15 — End: 2023-01-23
  Filled 2022-11-15: qty 180, 30d supply, fill #0

## 2022-11-15 MED ORDER — EMPAGLIFLOZIN 25 MG PO TABS
25.0000 mg | ORAL_TABLET | Freq: Every day | ORAL | 0 refills | Status: DC
Start: 2022-11-15 — End: 2022-12-18
  Filled 2022-11-15: qty 30, 30d supply, fill #0

## 2022-11-15 MED ORDER — QUETIAPINE FUMARATE ER 300 MG PO TB24
300.0000 mg | ORAL_TABLET | Freq: Every day | ORAL | 3 refills | Status: DC
Start: 2022-11-15 — End: 2023-10-18
  Filled 2022-11-15: qty 90, 90d supply, fill #0
  Filled 2023-01-23 – 2023-03-28 (×2): qty 90, 90d supply, fill #1
  Filled 2023-04-30 – 2023-06-29 (×3): qty 90, 90d supply, fill #2
  Filled 2023-09-10 – 2023-09-12 (×2): qty 90, 90d supply, fill #3

## 2022-11-15 MED ORDER — TAMSULOSIN HCL 0.4 MG PO CAPS
0.4000 mg | ORAL_CAPSULE | Freq: Every day | ORAL | 3 refills | Status: DC
Start: 2022-11-15 — End: 2023-11-30
  Filled 2022-11-15: qty 90, 90d supply, fill #0

## 2022-11-15 MED ORDER — ATORVASTATIN CALCIUM 80 MG PO TABS
80.0000 mg | ORAL_TABLET | Freq: Every day | ORAL | 3 refills | Status: DC
  Filled 2022-11-15: qty 90, 90d supply, fill #0
  Filled 2023-01-23 – 2023-02-26 (×3): qty 90, 90d supply, fill #1
  Filled 2023-03-28 – 2023-05-28 (×3): qty 90, 90d supply, fill #2
  Filled 2023-07-24 – 2023-09-10 (×2): qty 90, 90d supply, fill #3

## 2022-11-15 MED ORDER — QUETIAPINE FUMARATE 25 MG PO TABS
25.0000 mg | ORAL_TABLET | Freq: Two times a day (BID) | ORAL | 0 refills | Status: DC | PRN
  Filled 2022-11-15: qty 60, 30d supply, fill #0

## 2022-11-15 MED ORDER — METOPROLOL TARTRATE 25 MG PO TABS
25.0000 mg | ORAL_TABLET | Freq: Two times a day (BID) | ORAL | 3 refills | Status: DC
  Filled 2022-11-15 – 2023-03-28 (×2): qty 180, 90d supply, fill #0
  Filled 2023-05-28: qty 180, 90d supply, fill #1

## 2022-11-15 MED ORDER — HYDROXYZINE HCL 25 MG PO TABS
25.0000 mg | ORAL_TABLET | Freq: Three times a day (TID) | ORAL | 3 refills | Status: DC | PRN
  Filled 2022-11-15: qty 90, 30d supply, fill #0
  Filled 2022-12-18: qty 90, 30d supply, fill #1
  Filled 2023-01-23: qty 90, 30d supply, fill #2
  Filled 2023-02-22 – 2023-02-26 (×2): qty 90, 30d supply, fill #3

## 2022-11-15 NOTE — Patient Instructions (Addendum)
I have refilled your medicines.  As we discussed, I changed you to 1 long-acting pill of the Seroquel at night.  It is a slightly higher dose so you may need less during the day.  I have sent in all those medicines.  Have also made you an appointment with your new PCP as we discussed.  I will send in a referral for your hip.  I do think you need to see the orthopedist.  Please be sure and keep your follow-up appointment with your PCP.  If you have any questions or concerns in the meantime, you can leave a message for either him or me.  I am also drawing some lab work today.  I will send you a note in the mail regarding that.  If there is anything acute, I will call you.

## 2022-11-15 NOTE — Assessment & Plan Note (Addendum)
Currently stable.  He has been taking at least 1 if not 2 of his as needed Seroquel during the day.  I would like to consolidate his medication is much as possible so we will switch him to the extended release Seroquel at night.  He can still take an additional 1 or 2 during the day but it is less likely that he will need that.  He is amenable to this.  Follow-up 4 to 6 weeks.

## 2022-11-15 NOTE — Progress Notes (Signed)
    CHIEF COMPLAINT / HPI: #1.  Left hip pain: Getting worse.  Is ready to have some intervention.  Tramadol is helping slightly with does not really get his pain below a level for 5. 2.  Housing instability: Currently living in a rooming house but not sure if he is going to be able to stay there for various reasons.  Does feel safe there. 3.  Bipolar disorder: Says he is taking his medicines as we prescribed for him in the hospital.  He is taking his as needed Seroquel once or twice a day most days.  Says he feels the best he is felt in a long time 3.  Hypercholesterolemia: Reports compliance.  No problems with medication.   PERTINENT  PMH / PSH: I have reviewed the patient's medications, allergies, past medical and surgical history, smoking status and updated in the EMR as appropriate.   OBJECTIVE:  BP 137/89   Pulse 88   Ht 5\' 7"  (1.702 m)   Wt 238 lb (108 kg)   SpO2 97%   BMI 37.28 kg/m    ASSESSMENT / PLAN: #1.  Hip pain: Left greater than right.  Reviewed his most recent imaging.  He has bone-on-bone bilaterally.  Left hip also has some cystic change on x-ray.  Refer to orthopedics for evaluation for total hip replacement.  Bipolar 1 disorder (HCC) Currently stable.  He has been taking at least 1 if not 2 of his as needed Seroquel during the day.  I would like to consolidate his medication is much as possible so we will switch him to the extended release Seroquel at night.  He can still take an additional 1 or 2 during the day but it is less likely that he will need that.  He is amenable to this.  Follow-up 4 to 6 weeks.  Essential hypertension, benign Compliant with medication.  Labs today.  Follow-up 4 to 6 weeks.  Hypercholesteremia Says he has been compliant with his medication.  Will recheck LDL.  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  Will recheck labs at the 22-month mark from end of treatment.   Denny Levy MD

## 2022-11-16 LAB — COMPREHENSIVE METABOLIC PANEL
ALT: 9 [IU]/L (ref 0–44)
AST: 10 [IU]/L (ref 0–40)
Albumin: 4.2 g/dL (ref 3.9–4.9)
Alkaline Phosphatase: 81 [IU]/L (ref 44–121)
BUN/Creatinine Ratio: 10 (ref 10–24)
BUN: 7 mg/dL — ABNORMAL LOW (ref 8–27)
Bilirubin Total: <0.2 mg/dL (ref 0.0–1.2)
CO2: 27 mmol/L (ref 20–29)
Calcium: 9.3 mg/dL (ref 8.6–10.2)
Chloride: 100 mmol/L (ref 96–106)
Creatinine, Ser: 0.67 mg/dL — ABNORMAL LOW (ref 0.76–1.27)
Globulin, Total: 3.6 g/dL (ref 1.5–4.5)
Glucose: 124 mg/dL — ABNORMAL HIGH (ref 70–99)
Potassium: 3.7 mmol/L (ref 3.5–5.2)
Sodium: 142 mmol/L (ref 134–144)
Total Protein: 7.8 g/dL (ref 6.0–8.5)
eGFR: 106 mL/min/{1.73_m2} (ref >59)

## 2022-11-16 LAB — VALPROIC ACID LEVEL: Valproic Acid Lvl: 44 ug/mL — ABNORMAL LOW (ref 50–100)

## 2022-11-17 ENCOUNTER — Other Ambulatory Visit: Payer: Self-pay

## 2022-11-17 ENCOUNTER — Other Ambulatory Visit (HOSPITAL_BASED_OUTPATIENT_CLINIC_OR_DEPARTMENT_OTHER): Payer: Self-pay

## 2022-11-17 ENCOUNTER — Other Ambulatory Visit: Payer: Self-pay | Admitting: Family Medicine

## 2022-11-17 MED ORDER — MELATONIN 5 MG PO TABS
5.0000 mg | ORAL_TABLET | Freq: Every evening | ORAL | 0 refills | Status: AC | PRN
  Filled 2022-11-17 – 2022-12-18 (×2): qty 30, 30d supply, fill #0

## 2022-11-17 MED FILL — Gabapentin Tab 600 MG: ORAL | 30 days supply | Qty: 90 | Fill #6 | Status: AC

## 2022-11-17 NOTE — Assessment & Plan Note (Signed)
Compliant with medication.  Labs today.  Follow-up 4 to 6 weeks.

## 2022-11-17 NOTE — Assessment & Plan Note (Signed)
He has completed treatment while he was hospitalized.  Will recheck labs at the 20-month mark from end of treatment.

## 2022-11-17 NOTE — Assessment & Plan Note (Signed)
Says he has been compliant with his medication.  Will recheck LDL.

## 2022-11-20 ENCOUNTER — Encounter: Payer: Self-pay | Admitting: Family Medicine

## 2022-11-22 ENCOUNTER — Other Ambulatory Visit: Payer: Self-pay

## 2022-11-22 ENCOUNTER — Other Ambulatory Visit: Payer: Self-pay | Admitting: Family Medicine

## 2022-11-22 DIAGNOSIS — M25552 Pain in left hip: Secondary | ICD-10-CM

## 2022-11-22 MED FILL — Tizanidine HCl Tab 4 MG (Base Equivalent): ORAL | 22 days supply | Qty: 44 | Fill #0 | Status: CN

## 2022-12-04 ENCOUNTER — Other Ambulatory Visit: Payer: Self-pay

## 2022-12-13 ENCOUNTER — Other Ambulatory Visit: Payer: Self-pay

## 2022-12-13 MED FILL — Tizanidine HCl Tab 4 MG (Base Equivalent): ORAL | 22 days supply | Qty: 44 | Fill #0 | Status: AC

## 2022-12-14 ENCOUNTER — Ambulatory Visit: Payer: Medicare HMO | Admitting: Family Medicine

## 2022-12-18 ENCOUNTER — Other Ambulatory Visit: Payer: Self-pay

## 2022-12-18 ENCOUNTER — Other Ambulatory Visit: Payer: Self-pay | Admitting: Family Medicine

## 2022-12-18 DIAGNOSIS — N179 Acute kidney failure, unspecified: Secondary | ICD-10-CM

## 2022-12-18 MED FILL — Gabapentin Tab 600 MG: ORAL | 30 days supply | Qty: 90 | Fill #7 | Status: AC

## 2022-12-19 ENCOUNTER — Other Ambulatory Visit: Payer: Self-pay

## 2022-12-19 MED ORDER — QUETIAPINE FUMARATE 25 MG PO TABS
25.0000 mg | ORAL_TABLET | Freq: Two times a day (BID) | ORAL | 0 refills | Status: DC | PRN
  Filled 2022-12-19: qty 60, 30d supply, fill #0

## 2022-12-19 MED ORDER — EMPAGLIFLOZIN 25 MG PO TABS
25.0000 mg | ORAL_TABLET | Freq: Every day | ORAL | 0 refills | Status: DC
Start: 2022-12-19 — End: 2023-01-23
  Filled 2022-12-19: qty 30, 30d supply, fill #0

## 2022-12-20 ENCOUNTER — Other Ambulatory Visit: Payer: Self-pay

## 2022-12-20 ENCOUNTER — Ambulatory Visit: Payer: Medicare HMO | Admitting: Orthopaedic Surgery

## 2023-01-02 ENCOUNTER — Ambulatory Visit: Payer: Medicare HMO | Admitting: Orthopaedic Surgery

## 2023-01-03 IMAGING — CR DG LUMBAR SPINE 2-3V
1 series · 2 of 2 positions shown · non-contrast
Comparison: Lumbar spine radiographs 11/15/2006.

CLINICAL DATA: Bulging lumbar disc. Lower back pain for 15 years.

EXAM:
LUMBAR SPINE - 2-3 VIEW

[Series 1: dg lumbar spine 2-3 views · 0.14mm/px · 2 of 2 slices shown]
[im 1/2]
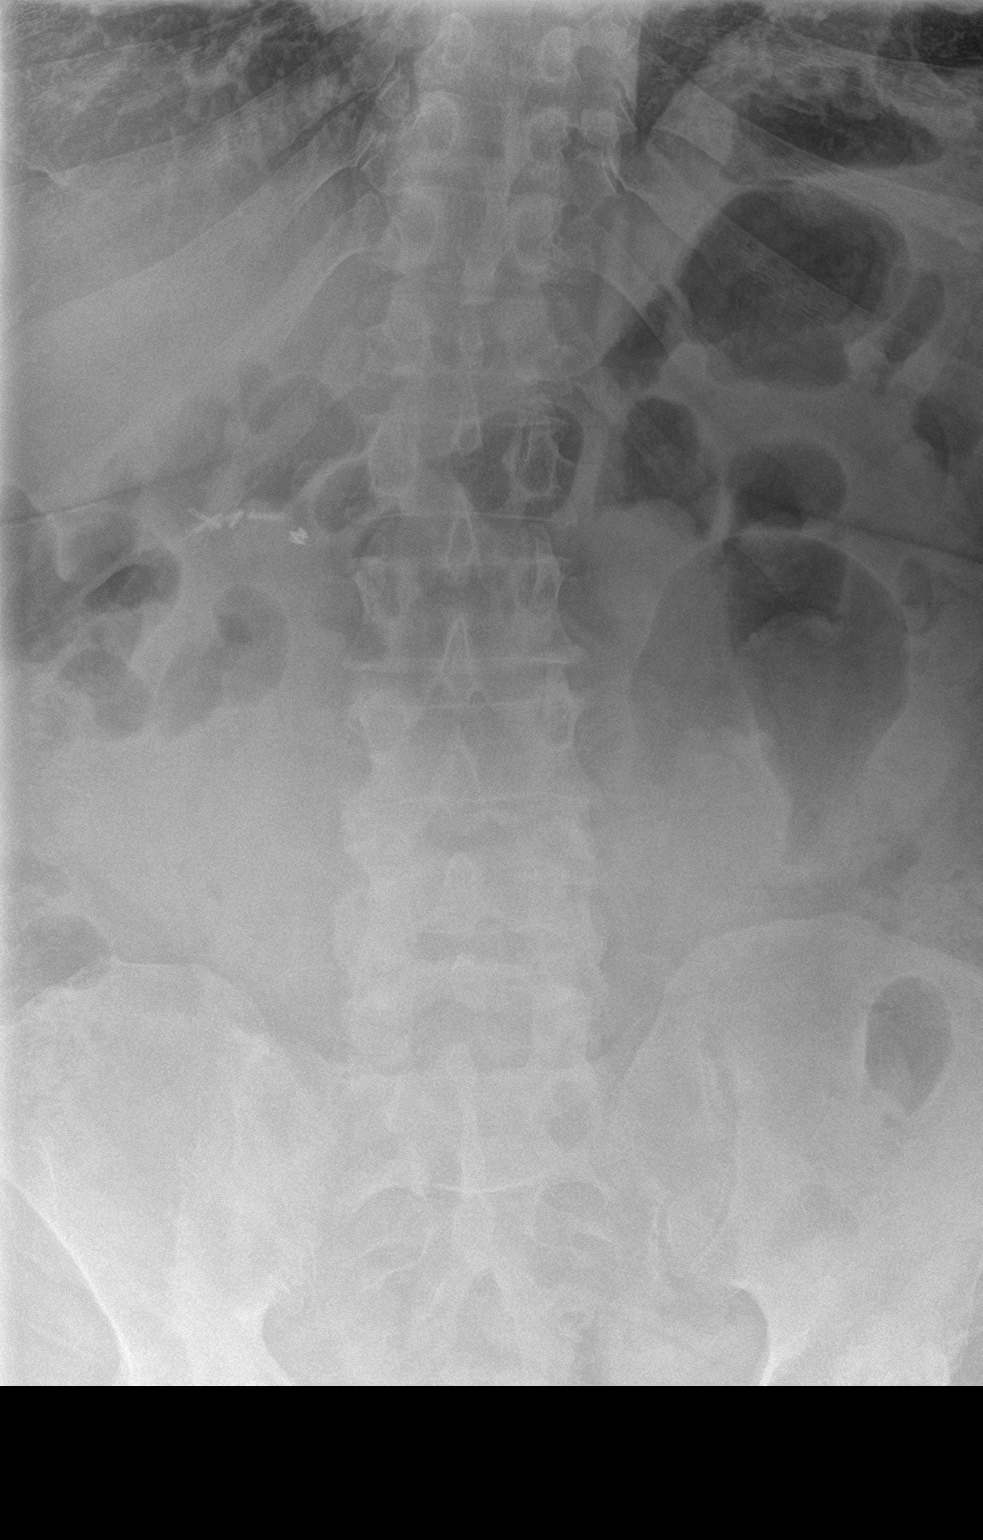
[im 2/2]
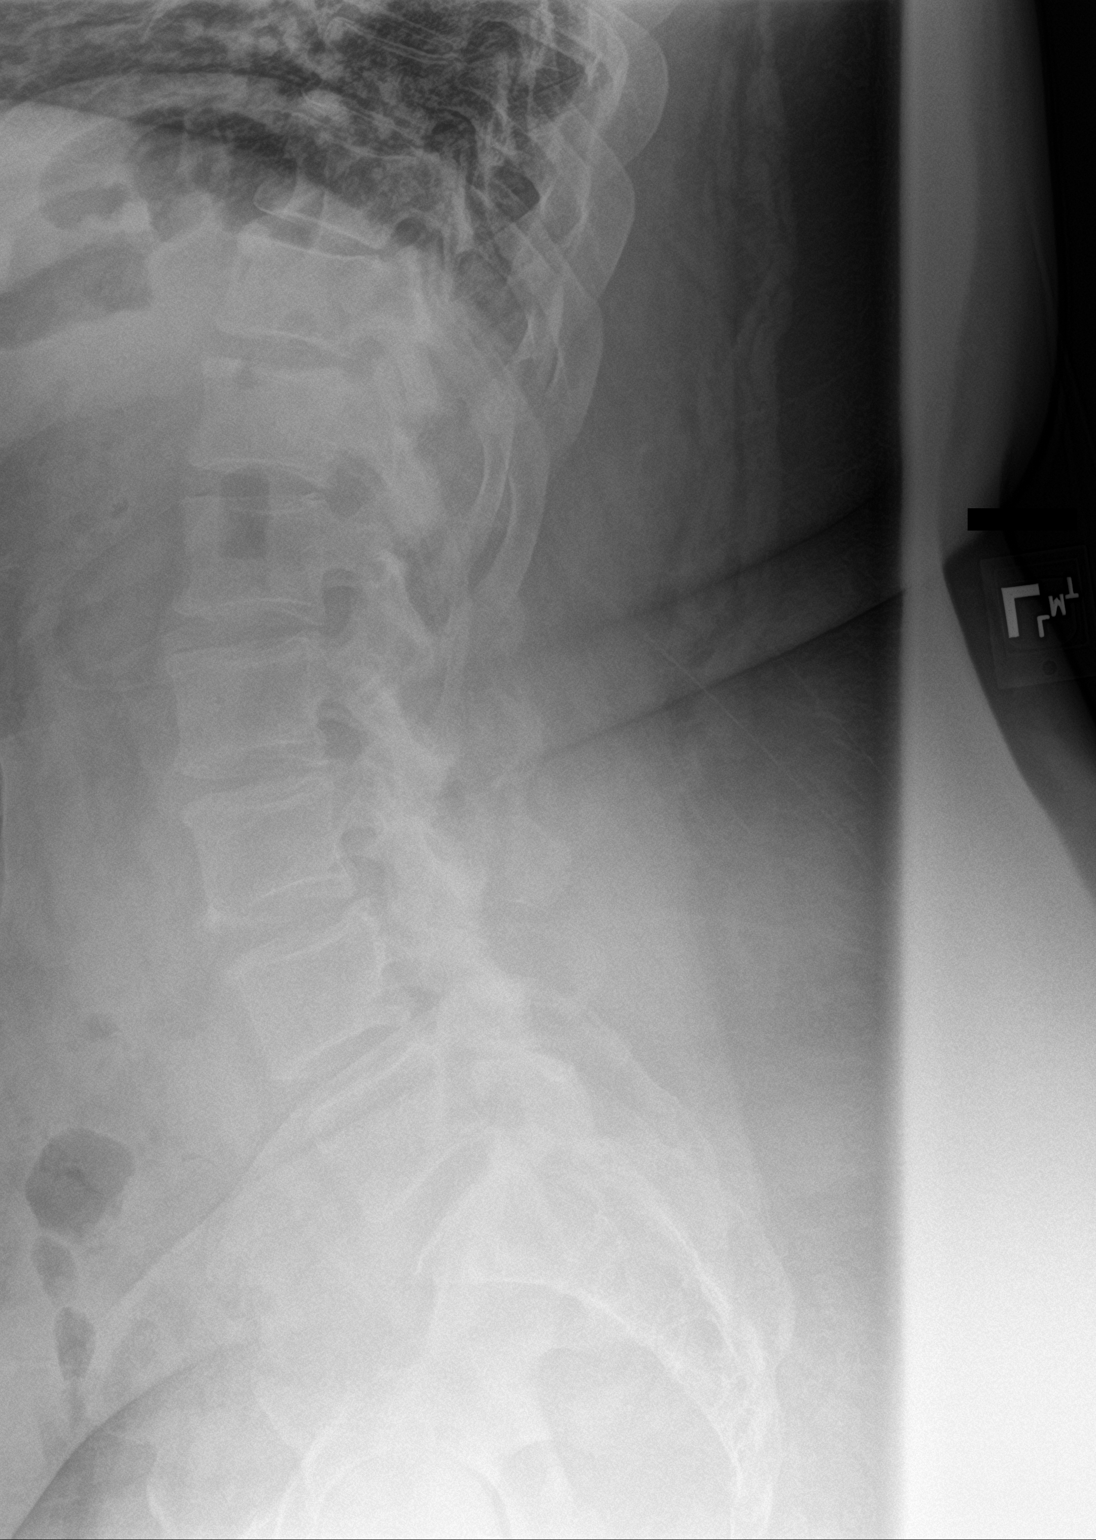

[2 of 2 positions shown; findings below may reference images not displayed]

FINDINGS: There are 5 non-rib-bearing lumbar-type vertebral bodies. Normal
frontal and sagittal alignment. Vertebral body heights are
maintained.

Mild L1-2 through L4-5 disc space narrowing.

Facet joint arthropathy is greatest at L5-S1 and L4-5. The bilateral
sacroiliac joint spaces are maintained. Moderate to severe bilateral
femoroacetabular joint space narrowing.

Right upper quadrant likely cholecystectomy clips.
IMPRESSION: :
IMPRESSION: 1. Mild L1-2 through L4-5 degenerative disc space narrowing.
2. Moderate to severe bilateral femoroacetabular osteoarthritis.

## 2023-01-23 ENCOUNTER — Other Ambulatory Visit: Payer: Self-pay | Admitting: Family Medicine

## 2023-01-23 ENCOUNTER — Other Ambulatory Visit: Payer: Self-pay

## 2023-01-23 ENCOUNTER — Other Ambulatory Visit (HOSPITAL_COMMUNITY): Payer: Self-pay

## 2023-01-23 DIAGNOSIS — M25552 Pain in left hip: Secondary | ICD-10-CM

## 2023-01-23 DIAGNOSIS — N179 Acute kidney failure, unspecified: Secondary | ICD-10-CM

## 2023-01-23 MED FILL — Gabapentin Tab 600 MG: ORAL | 30 days supply | Qty: 90 | Fill #8 | Status: AC

## 2023-01-25 ENCOUNTER — Other Ambulatory Visit: Payer: Self-pay

## 2023-01-25 MED ORDER — TIZANIDINE HCL 4 MG PO TABS
4.0000 mg | ORAL_TABLET | Freq: Two times a day (BID) | ORAL | 0 refills | Status: DC | PRN
Start: 2023-01-25 — End: 2023-02-22
  Filled 2023-01-25: qty 15, 8d supply, fill #0

## 2023-01-25 MED ORDER — EMPAGLIFLOZIN 25 MG PO TABS
25.0000 mg | ORAL_TABLET | Freq: Every day | ORAL | 0 refills | Status: DC
  Filled 2023-01-25: qty 30, 30d supply, fill #0

## 2023-01-25 MED ORDER — QUETIAPINE FUMARATE 25 MG PO TABS
25.0000 mg | ORAL_TABLET | Freq: Two times a day (BID) | ORAL | 0 refills | Status: DC | PRN
  Filled 2023-01-25: qty 60, 30d supply, fill #0

## 2023-01-25 MED ORDER — DIVALPROEX SODIUM 500 MG PO DR TAB
1500.0000 mg | DELAYED_RELEASE_TABLET | Freq: Two times a day (BID) | ORAL | 0 refills | Status: DC
  Filled 2023-01-25: qty 180, 30d supply, fill #0

## 2023-01-29 ENCOUNTER — Other Ambulatory Visit: Payer: Self-pay

## 2023-02-21 ENCOUNTER — Other Ambulatory Visit: Payer: Self-pay

## 2023-02-21 ENCOUNTER — Telehealth: Payer: Self-pay

## 2023-02-21 DIAGNOSIS — E119 Type 2 diabetes mellitus without complications: Secondary | ICD-10-CM

## 2023-02-21 MED ORDER — ACCU-CHEK GUIDE TEST VI STRP
ORAL_STRIP | 12 refills | Status: AC
Start: 2023-02-21 — End: ?
  Filled 2023-02-21: qty 100, 100d supply, fill #0
  Filled 2023-06-29: qty 100, 100d supply, fill #1
  Filled 2023-11-30: qty 100, 100d supply, fill #2
  Filled 2023-12-17: qty 100, 90d supply, fill #2

## 2023-02-21 MED ORDER — ACCU-CHEK GUIDE W/DEVICE KIT
1.0000 | PACK | Freq: Once | 0 refills | Status: AC
Start: 2023-02-21 — End: 2023-03-01
  Filled 2023-02-21: qty 1, 30d supply, fill #0

## 2023-02-21 MED ORDER — ACCU-CHEK SOFTCLIX LANCETS MISC
12 refills | Status: AC
  Filled 2023-02-21: qty 100, 100d supply, fill #0

## 2023-02-21 MED ORDER — ACCU-CHEK SOFTCLIX LANCET DEV KIT
1.0000 | PACK | Freq: Once | 0 refills | Status: AC
Start: 2023-02-21 — End: 2023-02-21
  Filled 2023-02-21: qty 1, fill #0

## 2023-02-21 NOTE — Telephone Encounter (Signed)
Patient contacted for follow-up of request for meter.   Patient reports he has not had a glucometer recently.   I shared that I would contact his pharmacy and attempt to resolve issue.  He likely should be covered for glucometer as he has a long history of diabetes.    Contacted pharmacy - no prescription available.  I will send glucometer and all supplies for patient use.  4 prescriptions sent.    Total time with patient call and documentation of interaction: 13 minutes.

## 2023-02-21 NOTE — Telephone Encounter (Signed)
Patient calls nurse line requesting that message be sent to Dr. Raymondo Band regarding blood sugar management.   He reports that he is unable to check blood sugar levels as he does not have a glucometer.   Requesting prescription for meter. Please indicate how often patient should be checking blood sugar per day.   Thanks.   Veronda Prude, RN

## 2023-02-22 ENCOUNTER — Other Ambulatory Visit: Payer: Self-pay

## 2023-02-22 ENCOUNTER — Other Ambulatory Visit: Payer: Self-pay | Admitting: Family Medicine

## 2023-02-22 DIAGNOSIS — N179 Acute kidney failure, unspecified: Secondary | ICD-10-CM

## 2023-02-22 DIAGNOSIS — M25552 Pain in left hip: Secondary | ICD-10-CM

## 2023-02-22 MED ORDER — TIZANIDINE HCL 4 MG PO TABS
4.0000 mg | ORAL_TABLET | Freq: Two times a day (BID) | ORAL | 0 refills | Status: DC | PRN
Start: 2023-02-22 — End: 2023-03-28
  Filled 2023-02-22: qty 15, 8d supply, fill #0

## 2023-02-22 MED ORDER — EMPAGLIFLOZIN 25 MG PO TABS
25.0000 mg | ORAL_TABLET | Freq: Every day | ORAL | 0 refills | Status: DC
  Filled 2023-02-22: qty 30, 30d supply, fill #0

## 2023-02-22 MED FILL — Gabapentin Tab 600 MG: ORAL | 30 days supply | Qty: 90 | Fill #9 | Status: CN

## 2023-02-26 ENCOUNTER — Other Ambulatory Visit: Payer: Self-pay | Admitting: Pharmacist

## 2023-02-26 ENCOUNTER — Other Ambulatory Visit: Payer: Self-pay

## 2023-02-26 DIAGNOSIS — R202 Paresthesia of skin: Secondary | ICD-10-CM

## 2023-02-26 DIAGNOSIS — E118 Type 2 diabetes mellitus with unspecified complications: Secondary | ICD-10-CM

## 2023-02-26 MED ORDER — GABAPENTIN 600 MG PO TABS
600.0000 mg | ORAL_TABLET | Freq: Three times a day (TID) | ORAL | 11 refills | Status: DC
Start: 2023-02-26 — End: 2023-10-19
  Filled 2023-02-26: qty 90, 30d supply, fill #0
  Filled 2023-03-28: qty 90, 30d supply, fill #1
  Filled 2023-04-30: qty 90, 30d supply, fill #2
  Filled 2023-06-05: qty 90, 30d supply, fill #3
  Filled 2023-06-29: qty 90, 30d supply, fill #4
  Filled 2023-07-27: qty 90, 30d supply, fill #5
  Filled 2023-09-10: qty 90, 30d supply, fill #6
  Filled 2023-10-18: qty 90, 30d supply, fill #7

## 2023-02-26 MED FILL — Gabapentin Tab 600 MG: ORAL | 30 days supply | Qty: 90 | Fill #9 | Status: CN

## 2023-02-27 ENCOUNTER — Other Ambulatory Visit: Payer: Self-pay

## 2023-02-28 ENCOUNTER — Other Ambulatory Visit: Payer: Self-pay

## 2023-03-28 ENCOUNTER — Other Ambulatory Visit: Payer: Self-pay | Admitting: Family Medicine

## 2023-03-28 ENCOUNTER — Other Ambulatory Visit: Payer: Self-pay

## 2023-03-28 DIAGNOSIS — M25552 Pain in left hip: Secondary | ICD-10-CM

## 2023-03-28 DIAGNOSIS — N179 Acute kidney failure, unspecified: Secondary | ICD-10-CM

## 2023-03-30 ENCOUNTER — Other Ambulatory Visit: Payer: Self-pay

## 2023-03-30 MED ORDER — EMPAGLIFLOZIN 25 MG PO TABS
25.0000 mg | ORAL_TABLET | Freq: Every day | ORAL | 1 refills | Status: DC
Start: 2023-03-30 — End: 2023-05-28
  Filled 2023-03-30: qty 30, 30d supply, fill #0
  Filled 2023-04-30: qty 30, 30d supply, fill #1

## 2023-03-30 MED ORDER — DIVALPROEX SODIUM 500 MG PO DR TAB
1500.0000 mg | DELAYED_RELEASE_TABLET | Freq: Two times a day (BID) | ORAL | 0 refills | Status: DC
  Filled 2023-03-30: qty 180, 30d supply, fill #0

## 2023-03-30 MED ORDER — QUETIAPINE FUMARATE 25 MG PO TABS
25.0000 mg | ORAL_TABLET | Freq: Two times a day (BID) | ORAL | 0 refills | Status: DC | PRN
  Filled 2023-03-30: qty 60, 30d supply, fill #0

## 2023-03-30 MED ORDER — HYDROXYZINE HCL 25 MG PO TABS
25.0000 mg | ORAL_TABLET | Freq: Three times a day (TID) | ORAL | 3 refills | Status: DC | PRN
  Filled 2023-03-30: qty 90, 30d supply, fill #0
  Filled 2023-04-30: qty 90, 30d supply, fill #1
  Filled 2023-05-28: qty 90, 30d supply, fill #2
  Filled 2023-06-29: qty 90, 30d supply, fill #3

## 2023-03-30 MED ORDER — TIZANIDINE HCL 4 MG PO TABS
4.0000 mg | ORAL_TABLET | Freq: Two times a day (BID) | ORAL | 0 refills | Status: DC | PRN
Start: 2023-03-30 — End: 2023-05-28
  Filled 2023-03-30: qty 15, 8d supply, fill #0

## 2023-04-03 ENCOUNTER — Other Ambulatory Visit (HOSPITAL_COMMUNITY): Payer: Self-pay

## 2023-04-04 ENCOUNTER — Other Ambulatory Visit: Payer: Self-pay

## 2023-04-30 ENCOUNTER — Other Ambulatory Visit: Payer: Self-pay

## 2023-04-30 ENCOUNTER — Other Ambulatory Visit: Payer: Self-pay | Admitting: Family Medicine

## 2023-04-30 DIAGNOSIS — F419 Anxiety disorder, unspecified: Secondary | ICD-10-CM

## 2023-04-30 MED ORDER — QUETIAPINE FUMARATE 25 MG PO TABS
25.0000 mg | ORAL_TABLET | Freq: Two times a day (BID) | ORAL | 0 refills | Status: DC | PRN
  Filled 2023-04-30: qty 60, 30d supply, fill #0

## 2023-04-30 MED ORDER — DIVALPROEX SODIUM 500 MG PO DR TAB
1500.0000 mg | DELAYED_RELEASE_TABLET | Freq: Two times a day (BID) | ORAL | 0 refills | Status: DC
  Filled 2023-04-30: qty 180, 30d supply, fill #0

## 2023-04-30 MED ORDER — BUSPIRONE HCL 10 MG PO TABS
10.0000 mg | ORAL_TABLET | Freq: Three times a day (TID) | ORAL | 3 refills | Status: DC
Start: 2023-04-30 — End: 2023-09-10
  Filled 2023-04-30: qty 90, 30d supply, fill #0
  Filled 2023-05-28: qty 90, 30d supply, fill #1
  Filled 2023-06-29: qty 90, 30d supply, fill #2
  Filled 2023-07-26: qty 90, 30d supply, fill #3

## 2023-05-03 ENCOUNTER — Other Ambulatory Visit: Payer: Self-pay

## 2023-05-28 ENCOUNTER — Other Ambulatory Visit: Payer: Self-pay

## 2023-05-28 ENCOUNTER — Other Ambulatory Visit: Payer: Self-pay | Admitting: Family Medicine

## 2023-05-28 DIAGNOSIS — M25552 Pain in left hip: Secondary | ICD-10-CM

## 2023-05-28 DIAGNOSIS — M545 Low back pain, unspecified: Secondary | ICD-10-CM

## 2023-05-28 DIAGNOSIS — N179 Acute kidney failure, unspecified: Secondary | ICD-10-CM

## 2023-05-29 ENCOUNTER — Other Ambulatory Visit: Payer: Self-pay

## 2023-05-29 MED ORDER — QUETIAPINE FUMARATE 25 MG PO TABS
25.0000 mg | ORAL_TABLET | Freq: Two times a day (BID) | ORAL | 0 refills | Status: DC | PRN
  Filled 2023-05-29: qty 60, 30d supply, fill #0

## 2023-05-29 MED ORDER — TIZANIDINE HCL 4 MG PO TABS
4.0000 mg | ORAL_TABLET | Freq: Two times a day (BID) | ORAL | 0 refills | Status: AC | PRN
Start: 2023-05-29 — End: ?
  Filled 2023-05-29: qty 15, 8d supply, fill #0

## 2023-05-29 MED ORDER — FLUOXETINE HCL 20 MG PO CAPS
60.0000 mg | ORAL_CAPSULE | Freq: Every day | ORAL | 0 refills | Status: DC
  Filled 2023-05-29 – 2023-06-29 (×2): qty 90, 30d supply, fill #0

## 2023-05-29 MED ORDER — DIVALPROEX SODIUM 500 MG PO DR TAB
1500.0000 mg | DELAYED_RELEASE_TABLET | Freq: Two times a day (BID) | ORAL | 0 refills | Status: DC
  Filled 2023-05-29: qty 180, 30d supply, fill #0

## 2023-05-29 MED ORDER — EMPAGLIFLOZIN 25 MG PO TABS
25.0000 mg | ORAL_TABLET | Freq: Every day | ORAL | 0 refills | Status: DC
  Filled 2023-05-29: qty 30, 30d supply, fill #0

## 2023-05-29 NOTE — Telephone Encounter (Signed)
 Refilling 30 days of psych meds and empagliflozin , 15 days tizanidine , and declined tramadol .  Patient needs to be seen in clinic for follow up prior to next refills.  Placed instructions for pharmacist to request the same and added same request to med sig.

## 2023-05-29 NOTE — Telephone Encounter (Signed)
 Refilling 30 days of psych meds and empagliflozin , 15 days tizanidine , and declined Tramadol  which was intended as temporary med at hospital discharge.  Placed instructions for pharmacist to have patient call clinic to schedule follow up appointment prior to next refill.  Placed same info in med sig for prescriptions.  Will have [facility] team call patient to schedule appointment as well.

## 2023-05-31 ENCOUNTER — Other Ambulatory Visit: Payer: Self-pay

## 2023-06-05 ENCOUNTER — Other Ambulatory Visit (HOSPITAL_COMMUNITY): Payer: Self-pay

## 2023-06-05 ENCOUNTER — Other Ambulatory Visit: Payer: Self-pay | Admitting: Family Medicine

## 2023-06-05 ENCOUNTER — Telehealth: Payer: Self-pay | Admitting: Pharmacist

## 2023-06-05 ENCOUNTER — Other Ambulatory Visit: Payer: Self-pay

## 2023-06-05 DIAGNOSIS — M545 Low back pain, unspecified: Secondary | ICD-10-CM

## 2023-06-05 DIAGNOSIS — M25552 Pain in left hip: Secondary | ICD-10-CM

## 2023-06-05 MED ORDER — TRAMADOL HCL 50 MG PO TABS
50.0000 mg | ORAL_TABLET | ORAL | 0 refills | Status: DC
  Filled 2023-06-05: qty 36, 9d supply, fill #0

## 2023-06-05 NOTE — Telephone Encounter (Signed)
 Patient contacts office to report request for both tizanidine  and tramadol .   He reports that he needs both.   Patient leaves Voice Mail requesting Assistance.   Patient contacted for follow-up of and discussed request for both tizanidine  AND tramadol .   Additionally, while on the phone patient requests assistance with support for using Ozempic  (semaglutide ) again.  With the help of Aron Lard, CPhT, I was able to share that his current Medicare [employer] cost for Ozempic  will be $47 per month.    I also shared information on Low Income Subsidy / Medicare Extra Help.  I provided him an ambulatory blood pressure cuff and information on the Low Income Subsidy with contact number.  He picked this up at ~ 4:25 PM from [facility].  Appears that patient's request was NOT granted by PCP, Dr. Lansing Planas.  Patient was not happy about abrupt discontinuation and requested transfer of care to Dr. Rozelle Corning.  I apologized and stated I could not help with any short-term supply prior to his visit without Dr. Bartholome Ligas approval.    I shared that I would communicate his lack of satisfaction with the lack of refill.    He has not been seen in the clinic since 10/2022.    Total time with patient call and documentation of interactions: 27 minutes.  F/U Phone call planned: None

## 2023-06-06 ENCOUNTER — Other Ambulatory Visit: Payer: Self-pay

## 2023-06-07 ENCOUNTER — Other Ambulatory Visit: Payer: Self-pay

## 2023-06-22 ENCOUNTER — Ambulatory Visit (INDEPENDENT_AMBULATORY_CARE_PROVIDER_SITE_OTHER): Admitting: Family Medicine

## 2023-06-22 ENCOUNTER — Other Ambulatory Visit: Payer: Self-pay

## 2023-06-22 ENCOUNTER — Encounter: Payer: Self-pay | Admitting: Family Medicine

## 2023-06-22 VITALS — BP 91/55 | HR 88 | Ht 67.0 in | Wt 242.6 lb

## 2023-06-22 DIAGNOSIS — M160 Bilateral primary osteoarthritis of hip: Secondary | ICD-10-CM

## 2023-06-22 DIAGNOSIS — E119 Type 2 diabetes mellitus without complications: Secondary | ICD-10-CM

## 2023-06-22 DIAGNOSIS — F319 Bipolar disorder, unspecified: Secondary | ICD-10-CM

## 2023-06-22 DIAGNOSIS — I959 Hypotension, unspecified: Secondary | ICD-10-CM

## 2023-06-22 DIAGNOSIS — A523 Neurosyphilis, unspecified: Secondary | ICD-10-CM | POA: Diagnosis not present

## 2023-06-22 DIAGNOSIS — M25511 Pain in right shoulder: Secondary | ICD-10-CM

## 2023-06-22 DIAGNOSIS — G8929 Other chronic pain: Secondary | ICD-10-CM

## 2023-06-22 LAB — POCT GLYCOSYLATED HEMOGLOBIN (HGB A1C): HbA1c, POC (controlled diabetic range): 6.8 % (ref 0.0–7.0)

## 2023-06-22 MED ORDER — TRAMADOL HCL 50 MG PO TABS
50.0000 mg | ORAL_TABLET | Freq: Three times a day (TID) | ORAL | 2 refills | Status: DC
  Filled 2023-06-22: qty 90, 30d supply, fill #0
  Filled 2023-07-24: qty 90, 30d supply, fill #1
  Filled 2023-09-10: qty 90, 30d supply, fill #2

## 2023-06-22 MED ORDER — BACLOFEN 10 MG PO TABS
5.0000 mg | ORAL_TABLET | Freq: Three times a day (TID) | ORAL | 2 refills | Status: DC
  Filled 2023-06-22: qty 45, 30d supply, fill #0
  Filled 2023-07-26: qty 45, 30d supply, fill #1
  Filled 2023-09-10: qty 45, 30d supply, fill #2

## 2023-06-22 MED ORDER — IBUPROFEN 600 MG PO TABS
600.0000 mg | ORAL_TABLET | Freq: Three times a day (TID) | ORAL | 0 refills | Status: AC | PRN
  Filled 2023-06-22: qty 30, 10d supply, fill #0

## 2023-06-22 NOTE — Assessment & Plan Note (Addendum)
 A1c 6.8 today.  While it is good that patient was able to obtain Jardiance , it is not ideal in the setting of his chronic borderline hypotension. - Intermittently takes Tresiba  26 units daily; discussed S/Sx of hypoglycemia - Patient to pursue Medicare assistance phone number for Ozempic  price change - Urine Pr/Cr and BMP today

## 2023-06-22 NOTE — Assessment & Plan Note (Signed)
 ID recommended repeat non-treponemal test at 6, 12, and 24 months from treatment in August.  Now 9 months out and never tested. - s/p extended course IV PCN treatment in August 2024 - RPR w/ reflex today

## 2023-06-22 NOTE — Progress Notes (Addendum)
 SUBJECTIVE:   CHIEF COMPLAINT / HPI:  Cody Nolan is a 63 y.o. 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  Patient was last seen October 16th by Dr. Andree Kayser after hospital discharge in August, when he was hospitalized with encephalopathy and psychosis in the setting of substance use.  Has been getting frequent refills for multiple psychoactive medications and tramadol .  Patient presents today by request for medication review and general health check-in.  Bilateral hip arthritis Confirmed by XR, bone on bone osteoarthritis L>R.  Referred to Ortho in October 2024 by Dr. Andree Kayser for possible hip replacement.  No showed appointment 01/02/2023 and referral was closed. Reports that he does not want to go to day surgery center (meaning same day outpatient surgery). Pain regimen: Prescribed Tramadol  100 mg BID (has had 50 mg BID prescribed), requests tizanidine  4 mg BID (15 pills provided monthly past few months), gabapentin  600 mg TID  BPD with manic and psychotic features  Polysubstance use disorder Denies manic episodes.  Has been sleeping ~6 hours nightly.  Endorses moderate anxiety, but still able to rest and relax. Endorses no substance use in past few months, no drinking, and no smoking. Reports hospitalization in August was due to a "binge," which will not happen again. Medication regimen: Depakote  1500 mg BID, buspirone  10 mg TID, fluoxetine  60 mg daily, quetiapine  XR 300 mg QHS, quetiapine  25 mg BID PRN, hydroxyzine  25 mg TID PRN  R shoulder pain Getting worse for past week. No injuries or falls. History of osteoarthritis and bursitis of that shoulder. Has had previous steroid injection with relief.  Neurosyphilis Completed inpatient treatment in August with plan for nontreponemal test at 6, 12,  and 24 month follow ups.  Has not had first test yet and is now 9 months out from treatment.  Will test today.  Type 2 diabetes mellitus Last A1c 6.4 in August 2024, though patient disagrees that he has diabetes Home CBGs: Mid-100s Medications: Jardiance  25 mg daily, Tresiba  26 units daily (reports taking some days intermittently, unclear who is prescribing this) Dr. Koval previously suggested 5 mg glipizide with review of S/Sx of hypoglycemia if unable to obtain Jardiance  or Ozempic  Medicare [employer] cost for Ozempic  will be $47 per month Dr. Koval provided info about Low Income Subsidy / Medicare Extra Help on 5/6 for Ozempic  Hypoglycemia plan: Drink juice if needed Eye exam: Due Foot exam: Due, deferred today Microalbumin: Due Statin: Atorvastatin  80 mg daily No symptoms of hypoglycemia, polyuria, polydipsia, numbness of extremities, foot ulcers/trauma  Hypotension Dr. Koval provided a blood pressure cuff on 5/6 from [facility]. Patient reports his home BP measurements have ranges in SBP 90s, states this is normal for him and he feels great. Denies postural dizziness, general weakness, syncope with standing. Medication regimen: Metoprolol  tartrate 25 mg BID and Jardiance  25 mg BID   PERTINENT PMH / PSH: BPD with manic and psychotic features Substance induced psychosis in August 2024 Polysubstance use disorder Neurosyphilis s/p treatment August 2024 T2DM Bilateral hip arthritis   OBJECTIVE:   BP (!) 91/55   Pulse 88   Ht 5\' 7"  (1.702 m)   Wt 242 lb 9.6 oz (110 kg)   SpO2 99%   BMI 38.00 kg/m   General: Age-appropriate, resting comfortably in chair, NAD, alert and at baseline. Cardiovascular: Regular rate and rhythm. Normal  S1/S2. No murmurs, rubs, or gallops appreciated. 2+ radial pulses. Pulmonary: Clear bilaterally to ascultation. No wheezes, crackles, or rhonchi. Normal WOB on room air. Skin: Warm and dry. MSK: Full ROM of R shoulder though painful with abduction, mild  edema near bursae of glenohumeral joint.  Limping gait, favors R leg.  Pain with extension and flexion of bilateral hips, no pain over trochanters or SI joints.  Negative straight leg raise bilaterally. Extremities: No peripheral edema bilaterally. Capillary refill <2 seconds. Psych: Mildly pressured speech and disorganized conversation, however patient is redirectable and interruptible.  Displays linear thinking and no clear flight of ideas.  Full range affect, appropriate overall.  At prior baseline.   ASSESSMENT/PLAN:   Assessment & Plan Primary osteoarthritis of both hips Significant bone-on-bone osteoarthritis L>R with flattening of femoral heads.  Limping gain.  Significant chronic pain and likely in need of hip replacement(s). - Provided Gilberto Labella address and phone number, unsure if patient will move forward with hip arthroplasty given his concerns but would benefit from this greatly - Transition from tizanidine  to baclofen 5 mg TID given hypotension - After shared decision making, will settle on 50 mg tramadol  TID today with understanding that patient must present to clinic once every 3 months to ensure continued refills - Add ibuprofen  600 mg TID PRN, though caution not to use daily given risk of ulcer - Follow up in 2 weeks at repeat visit - Ultimate goal for hip arthroplasty to decrease pain medicine regimen and address polypharmacy Hypotension, unspecified hypotension type Chronic, though does have HTN diagnosis.  Has been running low BP at least since hospital visit in August. - Encouraged good PO intake as weather gets hot - Recommended discontinuing Jardiance  25 mg, but patient states he will keep taking it - Discontinue metoprolol  25 mg BID - Transitioned from tizanidine  to baclofen as above Presumed neurosyphilis ID recommended repeat non-treponemal test at 6, 12, and 24 months from treatment in August.  Now 9 months out and never tested. - s/p extended course IV PCN  treatment in August 2024 - RPR w/ reflex today Bipolar 1 disorder [facility]) Patient is somewhat disorganized and has some pressured speech, but is interruptible and near baseline from his hospital admission in August.  Not acutely in psychosis or manic episode and denies recent substance use.  Does endorse increased anxiety.  Can consider increasing buspirone  dose per discussion with patient, will follow up next visit. - Continue Depakote  1500 mg BID, buspirone  10 mg TID, fluoxetine  60 mg daily, quetiapine  XR 300 mg QHS, quetiapine  25 mg BID PRN, hydroxyzine  25 mg TID PRN Type 2 diabetes mellitus without complication, without long-term current use of insulin  (HCC) A1c 6.8 today.  While it is good that patient was able to obtain Jardiance , it is not ideal in the setting of his chronic borderline hypotension. - Intermittently takes Tresiba  26 units daily; discussed S/Sx of hypoglycemia - Patient to pursue Medicare assistance phone number for Ozempic  price change - Urine Pr/Cr and BMP today Chronic right shoulder pain Mild edema of R shoulder, no acute injury, history of osteoarthritis and bursitis.  Favor bursitis.  Reports improvement with injection in the past, no time today in visit for procedure. - Return visit scheduled in 2 weeks for ultrasound and likely injection  Return in about 2 weeks (around 07/06/2023) for R shoulder injection.  Olamae Ferrara Lansing Planas, MD [facility]

## 2023-06-22 NOTE — Assessment & Plan Note (Addendum)
 Significant bone-on-bone osteoarthritis L>R with flattening of femoral heads.  Limping gain.  Significant chronic pain and likely in need of hip replacement(s). - Provided Gilberto Labella address and phone number, unsure if patient will move forward with hip arthroplasty given his concerns but would benefit from this greatly - Transition from tizanidine  to baclofen 5 mg TID given hypotension - After shared decision making, will settle on 50 mg tramadol  TID today with understanding that patient must present to clinic once every 3 months to ensure continued refills - Add ibuprofen  600 mg TID PRN, though caution not to use daily given risk of ulcer - Follow up in 2 weeks at repeat visit - Ultimate goal for hip arthroplasty to decrease pain medicine regimen and address polypharmacy

## 2023-06-22 NOTE — Assessment & Plan Note (Signed)
 Patient is somewhat disorganized and has some pressured speech, but is interruptible and near baseline from his hospital admission in August.  Not acutely in psychosis or manic episode and denies recent substance use.  Does endorse increased anxiety.  Can consider increasing buspirone  dose per discussion with patient, will follow up next visit. - Continue Depakote  1500 mg BID, buspirone  10 mg TID, fluoxetine  60 mg daily, quetiapine  XR 300 mg QHS, quetiapine  25 mg BID PRN, hydroxyzine  25 mg TID PRN

## 2023-06-22 NOTE — Patient Instructions (Addendum)
 It was great to see you today! Thank you for choosing [facility] for your primary care.  Today we addressed: Bipolar 1 disorder (HCC)  Neurosyphilis We will check your blood syphilis levels today and go from there.  Type 2 diabetes Please stop your Jardiance . Please keep working on calling the numbers given to you by Dr. Koval to get assistance with Ozempic .  Low blood pressure I would recommend stopping the Jardiance  25 mg and metoprolol .  Primary osteoarthritis of both hips Please follow up with the [facility] about a hip surgery: [address], [place], [place] [address] [phone] I am refilling 3 months of tramadol , 50 mg x3 pills daily. I am also switching you from tizanidine  to baclofen. You can also start taking ibuprofen  600 mg up to every 8 hours if needed.  We are checking some labs today, including BMP and syphilis tests.  You will get a MyChart message or a letter if results are normal. Otherwise, you will get a call from us .  You should return to our clinic in 2 weeks for a shoulder injection.  Thank you for coming to see us  at [facility] and for the opportunity to care for you! Ursala Cressy, MD 06/22/2023, 1:50 PM

## 2023-06-22 NOTE — Assessment & Plan Note (Deleted)
 Patient is actually hypotensive today, has been running low on BP ever since hospital visit in August. - Recommended discontinuing Jardiance  25 mg, but patient states he will keep taking it - Transitioned from tizanidine  to baclofen as above

## 2023-06-22 NOTE — Assessment & Plan Note (Signed)
 Mild edema of R shoulder, no acute injury, history of osteoarthritis and bursitis.  Favor bursitis.  Reports improvement with injection in the past, no time today in visit for procedure. - Return visit scheduled in 2 weeks for ultrasound and likely injection

## 2023-06-23 LAB — PROTEIN / CREATININE RATIO, URINE
Creatinine, Urine: 68.5 mg/dL
Protein, Ur: 6.2 mg/dL
Protein/Creat Ratio: 91 mg/g{creat} (ref 0–200)

## 2023-06-27 ENCOUNTER — Ambulatory Visit: Payer: Self-pay | Admitting: Family Medicine

## 2023-06-27 LAB — RPR: RPR Ser Ql: REACTIVE — AB

## 2023-06-27 LAB — BASIC METABOLIC PANEL WITH GFR
BUN/Creatinine Ratio: 17 (ref 10–24)
BUN: 14 mg/dL (ref 8–27)
CO2: 22 mmol/L (ref 20–29)
Calcium: 9.5 mg/dL (ref 8.6–10.2)
Chloride: 101 mmol/L (ref 96–106)
Creatinine, Ser: 0.83 mg/dL (ref 0.76–1.27)
Glucose: 159 mg/dL — ABNORMAL HIGH (ref 70–99)
Potassium: 4.1 mmol/L (ref 3.5–5.2)
Sodium: 140 mmol/L (ref 134–144)
eGFR: 99 mL/min/{1.73_m2} (ref >59)

## 2023-06-27 LAB — RPR, QUANT+TP ABS (REFLEX): Rapid Plasma Reagin, Quant: 1:32 {titer} — ABNORMAL HIGH

## 2023-06-27 NOTE — Telephone Encounter (Signed)
 Note positive RPR, treponemal Ab, and titer 1:32 increased from 1:16 in August, at which time he completed 2 week treatment course for suspected neurosyphilis (unable to obtain LP due to lack of capacity to consent to procedure).  Called Dr. Shereen Dike of ID today, who stated that twofold increase in titer is less concerning (>4 would be red flag).  No indication for repeat IV PCN treatment course.  Recommended dose of Bicillin  x1 now and repeat labs 1 year from initial treatment.  Patient returning 07/06/2023 for shoulder injection, will administer Bicillin  then.

## 2023-06-29 ENCOUNTER — Other Ambulatory Visit (HOSPITAL_COMMUNITY): Payer: Self-pay

## 2023-06-29 ENCOUNTER — Other Ambulatory Visit: Payer: Self-pay

## 2023-06-29 ENCOUNTER — Other Ambulatory Visit: Payer: Self-pay | Admitting: Family Medicine

## 2023-06-29 DIAGNOSIS — N179 Acute kidney failure, unspecified: Secondary | ICD-10-CM

## 2023-06-29 MED ORDER — EMPAGLIFLOZIN 25 MG PO TABS
25.0000 mg | ORAL_TABLET | Freq: Every day | ORAL | 0 refills | Status: DC
Start: 2023-06-29 — End: 2023-10-19
  Filled 2023-06-29: qty 30, 30d supply, fill #0

## 2023-06-29 MED ORDER — QUETIAPINE FUMARATE 25 MG PO TABS
25.0000 mg | ORAL_TABLET | Freq: Two times a day (BID) | ORAL | 2 refills | Status: DC | PRN
  Filled 2023-06-29: qty 60, 30d supply, fill #0
  Filled 2023-07-24: qty 60, 30d supply, fill #1
  Filled 2023-09-10: qty 60, 30d supply, fill #2

## 2023-06-29 MED ORDER — DIVALPROEX SODIUM 500 MG PO DR TAB
1500.0000 mg | DELAYED_RELEASE_TABLET | Freq: Two times a day (BID) | ORAL | 2 refills | Status: DC
  Filled 2023-06-29: qty 180, 30d supply, fill #0
  Filled 2023-07-26: qty 180, 30d supply, fill #1
  Filled 2023-11-30 – 2023-12-17 (×2): qty 180, 30d supply, fill #2

## 2023-07-03 ENCOUNTER — Other Ambulatory Visit: Payer: Self-pay

## 2023-07-06 ENCOUNTER — Encounter: Payer: Self-pay | Admitting: Family Medicine

## 2023-07-06 ENCOUNTER — Ambulatory Visit (INDEPENDENT_AMBULATORY_CARE_PROVIDER_SITE_OTHER): Payer: Self-pay | Admitting: Family Medicine

## 2023-07-06 VITALS — BP 80/40 | HR 88 | Ht 67.0 in | Wt 247.0 lb

## 2023-07-06 DIAGNOSIS — A539 Syphilis, unspecified: Secondary | ICD-10-CM

## 2023-07-06 DIAGNOSIS — G8929 Other chronic pain: Secondary | ICD-10-CM

## 2023-07-06 DIAGNOSIS — I959 Hypotension, unspecified: Secondary | ICD-10-CM | POA: Diagnosis not present

## 2023-07-06 DIAGNOSIS — M25511 Pain in right shoulder: Secondary | ICD-10-CM | POA: Diagnosis not present

## 2023-07-06 MED ORDER — PENICILLIN G BENZATHINE 1200000 UNIT/2ML IM SUSY
1.2000 10*6.[IU] | PREFILLED_SYRINGE | Freq: Once | INTRAMUSCULAR | Status: AC
  Administered 2023-07-06: 1.2 10*6.[IU] via INTRAMUSCULAR

## 2023-07-06 MED ORDER — METHYLPREDNISOLONE ACETATE 40 MG/ML IJ SUSP
40.0000 mg | Freq: Once | INTRAMUSCULAR | Status: AC
Start: 2023-07-06 — End: 2023-07-06
  Administered 2023-07-06: 40 mg via INTRA_ARTICULAR

## 2023-07-06 NOTE — Patient Instructions (Addendum)
 It was great to see you today! Thank you for choosing [facility] for your primary care.  Chronic right shoulder pain Today you received an injection with corticosteroid. This injection is usually done in response to pain and inflammation. There is some "numbing" medicine also in the shot so the injected area may be numb and feel really good for the next couple of hours. The numbing medicine usually wears off in 2-3 hours though, and then your pain level will be right back where it was before the injection.   The actually benefit from the steroid injection is usually noticed in 2-7 days. You may actually experience a small (as in 10%) INCREASE in pain in the first 24 hours---that is common.   Things to watch out for that you should contact us  or a health care provider urgently would include: 1. Unusual (as in more than 10%) increase in pain 2. New fever > 101.5 3. New swelling or redness of the injected area.  4. Streaking of red lines around the area injected.    You should return to our clinic in 3 months for refill of tramadol .  Thank you for coming to see us  at [facility] and for the opportunity to care for you! Lansing Planas Melora Menon, MD 07/06/2023, 3:37 PM

## 2023-07-06 NOTE — Progress Notes (Signed)
   SUBJECTIVE:   CHIEF COMPLAINT / HPI:  Cody Nolan is a 63 y.o. 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 He presents to the clinic for R shoulder injection and syphilis treatment.  R shoulder pain, osteoarthritis Patient has previously had some shoulder injections and requests R shoulder injection today.  Hypotension Has not taken metoprolol  in 1 week. Continues taking Jardiance , though instructed him to discontinue last visit. Reports one episode of dizziness with near fall a few weeks ago. Otherwise, no dyspnea, dizziness, pre-syncopal events, or weakness.  Syphilis Completed inpatient treatment in August.  Repeat RPR positive with only  1:1 titer.  Per conversation with ID, will give single dose Bicillin  and recheck RPR in 6-12 months.   PERTINENT PMH / PSH: BPD with manic and psychotic features Substance induced psychosis in August 2024 Polysubstance use disorder Neurosyphilis s/p treatment August 2024 T2DM Bilateral hip arthritis, multiple other joint osteoarthritis   OBJECTIVE:   BP (!) 80/40   Pulse 88   Ht 5\' 7"  (1.702 m)   Wt 247 lb (112 kg)   SpO2 98%   BMI 38.69 kg/m   General: Age-appropriate, resting comfortably in chair, NAD, alert and at baseline. Cardiovascular: Regular rate and rhythm. Normal S1/S2. No murmurs, rubs, or gallops appreciated. 2+ radial pulses. Pulmonary: Clear bilaterally to ascultation. No wheezes, crackles, or rhonchi. Normal WOB on room air. No accessory muscle use. Extremities: No peripheral edema bilaterally. Capillary refill <2 seconds. MSK: Full ROM of R shoulder, but some pain with passive ROM.  No bony abnormalities, no overlying lesions. Psych: Full range affect. No flight of ideas, organized reasoning. Interruptible.   ASSESSMENT/PLAN:    Assessment & Plan Chronic right shoulder pain Known osteoarthritis of R shoulder, previously responsive to injections.  Desires injection today.  PROCEDURE: RIGHT SHOULDER INJECTION Patient was given informed consent, signed copy in the chart. Appropriate time out was taken. Area prepped and draped in usual sterile fashion. Ethyl chloride was used for local anesthesia. A 21 gauge 1 1/2 inch needle was used. 4 cc of methylprednisolone  40 mg/ml plus  1 cc of 1% lidocaine  without epinephrine was injected into the right shoulder using a posterior subacromial approach.  The patient tolerated the procedure well. There were no complications. Post procedure instructions were given. Syphilis Discussed with ID, see phone note 06/27/23. - Bicillin  1.2 million units IM administered - Follow up RPR in 6-12 months Hypotension, unspecified hypotension type Chronically soft BP, near baseline today.  Asymptomatic today, does report one dizziness episode a few weeks ago. - Discontinue metoprolol  25 mg - Reiterated importance of holding Jardiance , patient states he will continue taking it  Return in about 3 months (around 10/06/2023) for refills.  Dealie Koelzer Lansing Planas, MD [facility]

## 2023-07-06 NOTE — Progress Notes (Signed)
 I saw and examined the patient and have discussed with the resident. I agree with the resident's note. I supervised and was present for the key portions of the procedure.

## 2023-07-06 NOTE — Assessment & Plan Note (Signed)
 Known osteoarthritis of R shoulder, previously responsive to injections.  Desires injection today.  PROCEDURE: RIGHT SHOULDER INJECTION Patient was given informed consent, signed copy in the chart. Appropriate time out was taken. Area prepped and draped in usual sterile fashion. Ethyl chloride was used for local anesthesia. A 21 gauge 1 1/2 inch needle was used. 4 cc of methylprednisolone  40 mg/ml plus  1 cc of 1% lidocaine  without epinephrine was injected into the right shoulder using a posterior subacromial approach.  The patient tolerated the procedure well. There were no complications. Post procedure instructions were given.

## 2023-07-24 ENCOUNTER — Other Ambulatory Visit: Payer: Self-pay

## 2023-07-24 ENCOUNTER — Other Ambulatory Visit: Payer: Self-pay | Admitting: Family Medicine

## 2023-07-24 ENCOUNTER — Telehealth: Payer: Self-pay | Admitting: Pharmacist

## 2023-07-24 DIAGNOSIS — N179 Acute kidney failure, unspecified: Secondary | ICD-10-CM

## 2023-07-24 DIAGNOSIS — E119 Type 2 diabetes mellitus without complications: Secondary | ICD-10-CM

## 2023-07-24 MED ORDER — TRESIBA FLEXTOUCH 100 UNIT/ML ~~LOC~~ SOPN
26.0000 [IU] | PEN_INJECTOR | Freq: Every day | SUBCUTANEOUS | 5 refills | Status: AC
  Filled 2023-07-24: qty 15, 57d supply, fill #0
  Filled 2023-09-10: qty 15, 57d supply, fill #1
  Filled 2023-11-19: qty 15, 57d supply, fill #2
  Filled 2024-03-03: qty 15, 57d supply, fill #3

## 2023-07-24 MED ORDER — OZEMPIC (1 MG/DOSE) 4 MG/3ML ~~LOC~~ SOPN
1.0000 mg | PEN_INJECTOR | SUBCUTANEOUS | 5 refills | Status: DC
  Filled 2023-07-24: qty 3, 28d supply, fill #0
  Filled 2023-09-10: qty 3, 28d supply, fill #1
  Filled 2023-10-18: qty 3, 28d supply, fill #2

## 2023-07-24 NOTE — Telephone Encounter (Addendum)
 Patient contacts office and requests call back at earliest convenience.   Attempted to contact patient for follow-up.  Left HIPAA compliant voice mail requesting call back to direct phone: [phone]  Patient returns call again and reports need for new prescriptions be sent to his pharmacy.  I did not engage in changing any doses over the phone.  I agreed to send refills for both Tresiba  and Ozempic  to his local pharmacy.  Total time with patient call and documentation of interaction: 17 minutes.

## 2023-07-25 NOTE — Telephone Encounter (Signed)
 Reviewed and agree with Dr Macky Lower plan.

## 2023-07-26 ENCOUNTER — Other Ambulatory Visit: Payer: Self-pay | Admitting: Family Medicine

## 2023-07-26 ENCOUNTER — Other Ambulatory Visit: Payer: Self-pay

## 2023-07-26 MED ORDER — HYDROXYZINE HCL 25 MG PO TABS
25.0000 mg | ORAL_TABLET | Freq: Three times a day (TID) | ORAL | 3 refills | Status: DC | PRN
  Filled 2023-07-26: qty 90, 30d supply, fill #0
  Filled 2023-09-10: qty 90, 30d supply, fill #1
  Filled 2023-11-16: qty 90, 30d supply, fill #2
  Filled 2024-01-16: qty 90, 30d supply, fill #3

## 2023-07-26 MED ORDER — FLUOXETINE HCL 20 MG PO CAPS
60.0000 mg | ORAL_CAPSULE | Freq: Every day | ORAL | 0 refills | Status: DC
  Filled 2023-07-26: qty 90, 30d supply, fill #0

## 2023-07-27 ENCOUNTER — Other Ambulatory Visit: Payer: Self-pay

## 2023-07-27 ENCOUNTER — Other Ambulatory Visit: Payer: Self-pay | Admitting: Family Medicine

## 2023-07-27 DIAGNOSIS — M160 Bilateral primary osteoarthritis of hip: Secondary | ICD-10-CM

## 2023-07-30 ENCOUNTER — Other Ambulatory Visit: Payer: Self-pay | Admitting: Family Medicine

## 2023-07-30 ENCOUNTER — Other Ambulatory Visit: Payer: Self-pay

## 2023-07-30 DIAGNOSIS — M160 Bilateral primary osteoarthritis of hip: Secondary | ICD-10-CM

## 2023-07-30 DIAGNOSIS — N179 Acute kidney failure, unspecified: Secondary | ICD-10-CM

## 2023-07-31 ENCOUNTER — Other Ambulatory Visit: Payer: Self-pay

## 2023-08-02 ENCOUNTER — Other Ambulatory Visit: Payer: Self-pay

## 2023-08-10 ENCOUNTER — Other Ambulatory Visit: Payer: Self-pay

## 2023-09-10 ENCOUNTER — Other Ambulatory Visit: Payer: Self-pay

## 2023-09-10 ENCOUNTER — Other Ambulatory Visit: Payer: Self-pay | Admitting: Family Medicine

## 2023-09-10 DIAGNOSIS — F419 Anxiety disorder, unspecified: Secondary | ICD-10-CM

## 2023-09-11 ENCOUNTER — Other Ambulatory Visit: Payer: Self-pay

## 2023-09-11 MED ORDER — FLUOXETINE HCL 20 MG PO CAPS
60.0000 mg | ORAL_CAPSULE | Freq: Every day | ORAL | 0 refills | Status: DC
  Filled 2023-09-11: qty 90, 30d supply, fill #0

## 2023-09-11 MED ORDER — BUSPIRONE HCL 10 MG PO TABS
10.0000 mg | ORAL_TABLET | Freq: Three times a day (TID) | ORAL | 0 refills | Status: DC
  Filled 2023-09-11: qty 90, 30d supply, fill #0

## 2023-09-13 ENCOUNTER — Other Ambulatory Visit: Payer: Self-pay

## 2023-10-18 ENCOUNTER — Other Ambulatory Visit: Payer: Self-pay

## 2023-10-18 ENCOUNTER — Other Ambulatory Visit: Payer: Self-pay | Admitting: Family Medicine

## 2023-10-18 ENCOUNTER — Telehealth: Payer: Self-pay | Admitting: Pharmacist

## 2023-10-18 DIAGNOSIS — M160 Bilateral primary osteoarthritis of hip: Secondary | ICD-10-CM

## 2023-10-18 DIAGNOSIS — F419 Anxiety disorder, unspecified: Secondary | ICD-10-CM

## 2023-10-18 MED ORDER — QUETIAPINE FUMARATE 25 MG PO TABS
25.0000 mg | ORAL_TABLET | Freq: Two times a day (BID) | ORAL | 2 refills | Status: AC | PRN
  Filled 2023-10-18: qty 90, 45d supply, fill #0
  Filled 2023-11-16 – 2023-12-17 (×3): qty 90, 45d supply, fill #1
  Filled 2024-03-03: qty 90, 45d supply, fill #2

## 2023-10-18 MED ORDER — BUSPIRONE HCL 10 MG PO TABS
10.0000 mg | ORAL_TABLET | Freq: Three times a day (TID) | ORAL | 1 refills | Status: AC
  Filled 2023-10-18: qty 270, 90d supply, fill #0
  Filled 2024-03-03: qty 270, 90d supply, fill #1

## 2023-10-18 MED ORDER — BACLOFEN 10 MG PO TABS
5.0000 mg | ORAL_TABLET | Freq: Three times a day (TID) | ORAL | 2 refills | Status: DC
  Filled 2023-10-18: qty 45, 30d supply, fill #0
  Filled 2023-11-16: qty 45, 30d supply, fill #1
  Filled 2023-12-17: qty 45, 30d supply, fill #2

## 2023-10-18 MED ORDER — TRAMADOL HCL 50 MG PO TABS
50.0000 mg | ORAL_TABLET | Freq: Three times a day (TID) | ORAL | 0 refills | Status: DC
  Filled 2023-10-18: qty 90, 30d supply, fill #0

## 2023-10-18 MED ORDER — QUETIAPINE FUMARATE ER 300 MG PO TB24
300.0000 mg | ORAL_TABLET | Freq: Every day | ORAL | 3 refills | Status: AC
  Filled 2023-10-18 – 2023-12-17 (×2): qty 90, 90d supply, fill #0

## 2023-10-18 MED ORDER — FLUOXETINE HCL 20 MG PO CAPS
60.0000 mg | ORAL_CAPSULE | Freq: Every day | ORAL | 0 refills | Status: DC
  Filled 2023-10-18: qty 90, 30d supply, fill #0

## 2023-10-18 NOTE — Telephone Encounter (Signed)
 Patient contacts office to share that he has scheduled appointment with PCP, Dr. Toma, tomorrow PM and would like to request refills of several medications so they can be prepared for his pick-up prior to tomorrow PM to avoid making two trips to [facility] for medication pick-up.   Requests refills for: Buspirone , tramadol , baclofen , seroquel  and fluoxetine .   Review of chart reveals no refills on above medications.   Agreed with refills on all with exception of tramadol  as this is not listed on current medication list.   Route to PCP.     Total time with patient call and documentation of interaction: 18 minutes.

## 2023-10-18 NOTE — Telephone Encounter (Signed)
 Reviewed and agree with Dr Rennis plan.

## 2023-10-19 ENCOUNTER — Ambulatory Visit (INDEPENDENT_AMBULATORY_CARE_PROVIDER_SITE_OTHER): Admitting: Family Medicine

## 2023-10-19 ENCOUNTER — Other Ambulatory Visit: Payer: Self-pay

## 2023-10-19 VITALS — BP 115/64 | HR 115 | Ht 67.0 in | Wt 255.6 lb

## 2023-10-19 DIAGNOSIS — G6289 Other specified polyneuropathies: Secondary | ICD-10-CM

## 2023-10-19 DIAGNOSIS — G8929 Other chronic pain: Secondary | ICD-10-CM | POA: Diagnosis not present

## 2023-10-19 DIAGNOSIS — F429 Obsessive-compulsive disorder, unspecified: Secondary | ICD-10-CM

## 2023-10-19 DIAGNOSIS — R200 Anesthesia of skin: Secondary | ICD-10-CM

## 2023-10-19 DIAGNOSIS — R000 Tachycardia, unspecified: Secondary | ICD-10-CM

## 2023-10-19 DIAGNOSIS — E119 Type 2 diabetes mellitus without complications: Secondary | ICD-10-CM | POA: Diagnosis not present

## 2023-10-19 DIAGNOSIS — F319 Bipolar disorder, unspecified: Secondary | ICD-10-CM

## 2023-10-19 DIAGNOSIS — R202 Paresthesia of skin: Secondary | ICD-10-CM

## 2023-10-19 LAB — POCT GLYCOSYLATED HEMOGLOBIN (HGB A1C): HbA1c, POC (controlled diabetic range): 6.6 % (ref 0.0–7.0)

## 2023-10-19 MED ORDER — METOPROLOL SUCCINATE ER 25 MG PO TB24: 12.5000 mg | ORAL_TABLET | Freq: Every day | ORAL | Status: AC

## 2023-10-19 MED ORDER — OZEMPIC (1 MG/DOSE) 4 MG/3ML ~~LOC~~ SOPN: 2.0000 mg | PEN_INJECTOR | SUBCUTANEOUS | Status: DC

## 2023-10-19 MED ORDER — GABAPENTIN 300 MG PO CAPS: 300.0000 mg | ORAL_CAPSULE | Freq: Three times a day (TID) | ORAL | Status: DC

## 2023-10-19 NOTE — Progress Notes (Signed)
 SUBJECTIVE:   CHIEF COMPLAINT / HPI:  Cody Nolan is a 63 y.o. 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  Type 2 diabetes mellitus - Last A1c 6.8 three months ago - Medications: Ozempic  1 mg - Eye exam: Due - Foot exam: Due - Microalbumin: Due - Statin: Atorvastatin  80 mg - No symptoms of hypoglycemia, polyuria, polydipsia, foot ulcers/trauma - Continues to endorse numbness of extremities related to neuropathy  BPD with manic and psychotic features Obsessive compulsive disorder Polysubstance use disorder history Declines Psychiatry referral.  States that he believes he is doing well and would like to maintain his psychiatric medicines as they are. Denies recent substance use.  PERTINENT PMH / PSH: BPD with manic and psychotic features Substance induced psychosis in August 2024 Polysubstance use disorder Neurosyphilis s/p treatment August 2024 T2DM Bilateral hip arthritis  *Remainder reviewed in problem list.   OBJECTIVE:   BP 115/64   Pulse (!) 115   Ht 5' 7 (1.702 m)   Wt 255 lb 9.6 oz (115.9 kg)   SpO2 95%   BMI 40.03 kg/m   General: Age-appropriate, resting comfortably in chair, NAD, alert and at baseline. Cardiovascular: Tachycardic rate.  Regular rhythm. Normal S1/S2. No murmurs, rubs, or gallops appreciated. 2+ radial pulses. Pulmonary: Clear bilaterally to ascultation. No wheezes, crackles, or rhonchi. Normal WOB on room air. No accessory muscle use. Abdominal: No tenderness to deep or light palpation. No rebound or guarding. No HSM. Extremities: No peripheral edema bilaterally. Capillary refill <2 seconds. Psych: Full range affect.  Easily interruptible, no flight of ideas, organized reasoning,  denies hallucinations, denies SI/HI.  Normal judgment and insight.  Diabetic Foot Exam - Simple   Simple Foot Form Visual Inspection No deformities, no ulcerations, no other skin breakdown bilaterally: Yes Sensation Testing Intact to touch and monofilament testing bilaterally: Yes Pulse Check Posterior Tibialis and Dorsalis pulse intact bilaterally: Yes Comments Diabetic foot exam with monofilament shows intact sensation and discrimination over all toes and plantar surfaces of feet bilaterally.  No ulcers or calluses present.     ASSESSMENT/PLAN:   Assessment & Plan Type 2 diabetes mellitus without complication, without long-term current use of insulin  (HCC) A1c well-controlled at 6.6. - Increase Ozempic  to 2 mg weekly given patient's recent weight gain and good tolerance of 1 mg dose - Diabetic foot exam normal today - Recommended ophthalmology diabetic eye evaluation - Microalbumin/creatinine urine evaluation - BMP for creatinine monitoring Other chronic pain Multiple sources of pain, however most severe location is likely in hips given patient's severe osteoarthritis on radiography.  Patient continues to refuse to follow-up with orthopedic surgery and pursue hip replacement surgery given his desire to stay overnight in the hospital after this procedure, which surgery was informed of his generally outpatient procedure. Again discussed pain regimen patient.  Per previously discussed agreement, prefer to avoid multiple opiates given patient's history of substance use.  Patient denies illicit substance use since prior hospitalization in August. Patient notes that he has only filled tramadol  twice since last visit, states he does not always feel motivated to take it because his pain is severe enough that the medicine does not always help.  I am certain that patient has poorly controlled pain, but given his multiple high-dose psychiatric medicines with  serotonergic effects, substance use  history, and inconsistent follow-up in the past, do not feel comfortable prescribing additional pain medicines for the patient at this time.  This pain would be best addressed by hip replacement surgery. - Continue tramadol  50 mg 3 times daily, refills every 3 months - Patient declines physical therapy Tachycardia Unclear etiology.  Rhythm is regular, do not suspect A-fib at this time. Patient maintains that his baseline heart rate is tachycardic.  Due to borderline tachycardia and multiple prior visits and patient was taken metoprolol  at the time.  Metoprolol  was discontinued at prior visit due to patient's hypotension.  Today, BP is stable and normal.  Will resume low-dose metoprolol  and monitor closely.  Will evaluate for anemia or hyperthyroidism. - Restart metoprolol  12.5 mg - TSH, CBC - Patient open to considering echocardiogram in the future Other polyneuropathy Numbness and tingling in right hand Patient reports persistent neuropathy. - Increase gabapentin  to 900 mg 3 times daily Bipolar 1 disorder (HCC) Obsessive-compulsive disorder, unspecified type Patient continues on medication regimen as below.  On evaluation today, patient's mood is stable and he is showing no signs of psychosis or mania.  This is reassuring, however feel patient would be best served by following up with psychiatry specialist given the complexity of his psychiatric regimen and problems. - Continue Depakote  1500 mg BID, buspirone  10 mg TID, fluoxetine  60 mg daily, quetiapine  XR 300 mg QHS, quetiapine  25 mg BID PRN, hydroxyzine  25 mg TID PRN - Again encouraged patient to follow-up with psychiatry; patient continues to refuse, citing poor interactions with psychiatry during his prior hospitalization  Anitria Andon Toma, MD [facility]

## 2023-10-19 NOTE — Patient Instructions (Addendum)
 It was great to see you today! Thank you for choosing [facility] for your primary care.  Today we addressed: 1. Diabetes Your A1c looks great today!  We are checking your kidney function with your urine. We can increase your Ozempic  to 2 mg starting next month.  Tachycardia Because your heart rate is high, we are checking your thyroid labs and your blood count. Please start taking metoprolol  12.5 mg once daily again.  Neuropathy I have increased your gabapentin  to 900 mg three times daily.  Please consider seeing psychiatry to adjust your psychiatric medicines.  We are checking some labs today, including your metabolic panel, blood count, thyroid lab.  You will get a MyChart message or a letter if results are normal. Otherwise, you will get a call from us .  You should return to our clinic in 3 months.  Thank you for coming to see us  at [facility] and for the opportunity to care for you! Cristian Davitt, MD 10/19/2023, 3:55 PM

## 2023-10-19 NOTE — Assessment & Plan Note (Signed)
 A1c well-controlled at 6.6. - Increase Ozempic  to 2 mg weekly given patient's recent weight gain and good tolerance of 1 mg dose - Diabetic foot exam normal today - Recommended ophthalmology diabetic eye evaluation - Microalbumin/creatinine urine evaluation - BMP for creatinine monitoring

## 2023-10-19 NOTE — Assessment & Plan Note (Signed)
 Patient continues on medication regimen as below.  On evaluation today, patient's mood is stable and he is showing no signs of psychosis or mania.  This is reassuring, however feel patient would be best served by following up with psychiatry specialist given the complexity of his psychiatric regimen and problems. - Continue Depakote  1500 mg BID, buspirone  10 mg TID, fluoxetine  60 mg daily, quetiapine  XR 300 mg QHS, quetiapine  25 mg BID PRN, hydroxyzine  25 mg TID PRN - Again encouraged patient to follow-up with psychiatry; patient continues to refuse, citing poor interactions with psychiatry during his prior hospitalization

## 2023-10-19 NOTE — Assessment & Plan Note (Signed)
 Patient reports persistent neuropathy. - Increase gabapentin  to 900 mg 3 times daily

## 2023-10-19 NOTE — Assessment & Plan Note (Signed)
 Multiple sources of pain, however most severe location is likely in hips given patient's severe osteoarthritis on radiography.  Patient continues to refuse to follow-up with orthopedic surgery and pursue hip replacement surgery given his desire to stay overnight in the hospital after this procedure, which surgery was informed of his generally outpatient procedure. Again discussed pain regimen patient.  Per previously discussed agreement, prefer to avoid multiple opiates given patient's history of substance use.  Patient denies illicit substance use since prior hospitalization in August. Patient notes that he has only filled tramadol  twice since last visit, states he does not always feel motivated to take it because his pain is severe enough that the medicine does not always help.  I am certain that patient has poorly controlled pain, but given his multiple high-dose psychiatric medicines with serotonergic effects, substance use history, and inconsistent follow-up in the past, do not feel comfortable prescribing additional pain medicines for the patient at this time.  This pain would be best addressed by hip replacement surgery. - Continue tramadol  50 mg 3 times daily, refills every 3 months - Patient declines physical therapy

## 2023-10-20 LAB — BASIC METABOLIC PANEL WITH GFR
BUN/Creatinine Ratio: 13 (ref 10–24)
BUN: 9 mg/dL (ref 8–27)
CO2: 29 mmol/L (ref 20–29)
Calcium: 9.5 mg/dL (ref 8.6–10.2)
Chloride: 98 mmol/L (ref 96–106)
Creatinine, Ser: 0.71 mg/dL — ABNORMAL LOW (ref 0.76–1.27)
Glucose: 173 mg/dL — ABNORMAL HIGH (ref 70–99)
Potassium: 4.2 mmol/L (ref 3.5–5.2)
Sodium: 142 mmol/L (ref 134–144)
eGFR: 104 mL/min/1.73 (ref >59)

## 2023-10-20 LAB — MICROALBUMIN / CREATININE URINE RATIO
Creatinine, Urine: 178.1 mg/dL
Microalb/Creat Ratio: 3 mg/g{creat} (ref 0–29)
Microalbumin, Urine: 5.6 ug/mL

## 2023-10-20 LAB — CBC WITH DIFFERENTIAL/PLATELET
Basophils Absolute: 0.1 x10E3/uL (ref 0.0–0.2)
Basos: 1 %
EOS (ABSOLUTE): 0.1 x10E3/uL (ref 0.0–0.4)
Eos: 1 %
Hematocrit: 40.8 % (ref 37.5–51.0)
Hemoglobin: 12.7 g/dL — ABNORMAL LOW (ref 13.0–17.7)
Immature Grans (Abs): 0.1 x10E3/uL (ref 0.0–0.1)
Immature Granulocytes: 1 %
Lymphocytes Absolute: 3.7 x10E3/uL — ABNORMAL HIGH (ref 0.7–3.1)
Lymphs: 38 %
MCH: 28.7 pg (ref 26.6–33.0)
MCHC: 31.1 g/dL — ABNORMAL LOW (ref 31.5–35.7)
MCV: 92 fL (ref 79–97)
Monocytes Absolute: 0.9 x10E3/uL (ref 0.1–0.9)
Monocytes: 9 %
Neutrophils Absolute: 5 x10E3/uL (ref 1.4–7.0)
Neutrophils: 50 %
Platelets: 250 x10E3/uL (ref 150–450)
RBC: 4.42 x10E6/uL (ref 4.14–5.80)
RDW: 13.9 % (ref 11.6–15.4)
WBC: 9.9 x10E3/uL (ref 3.4–10.8)

## 2023-10-20 LAB — TSH: TSH: 2.52 u[IU]/mL (ref 0.450–4.500)

## 2023-10-23 ENCOUNTER — Ambulatory Visit: Payer: Self-pay | Admitting: Family Medicine

## 2023-10-23 NOTE — Telephone Encounter (Signed)
 Called patient, left HIPAA compliant voicemail asking patient to call back clinic when able.  For clinic team if patient calls back: Overall, kidney function and health (creatinine and microalbumin) as well as electrolytes are normal. Only notable abnormality is borderline low hemoglobin.  I would recommend getting a colonoscopy as discussed with patient in clinic prior, as a low blood count in an adult male can be a sign of colorectal cancer.  This is a low probability and not a diagnostic test, but it is important to be up to date on colonoscopy regardless and Cody Nolan has not had one in the past 10 years to the best of my knowledge.  Happy to place a referral and GI will call him if open to the idea.  I will follow up in the next clinic visit as well.

## 2023-11-12 ENCOUNTER — Ambulatory Visit

## 2023-11-16 ENCOUNTER — Other Ambulatory Visit: Payer: Self-pay | Admitting: Family Medicine

## 2023-11-16 ENCOUNTER — Other Ambulatory Visit: Payer: Self-pay

## 2023-11-16 ENCOUNTER — Telehealth: Payer: Self-pay

## 2023-11-16 ENCOUNTER — Telehealth: Payer: Self-pay | Admitting: Pharmacist

## 2023-11-16 DIAGNOSIS — E119 Type 2 diabetes mellitus without complications: Secondary | ICD-10-CM

## 2023-11-16 DIAGNOSIS — R200 Anesthesia of skin: Secondary | ICD-10-CM

## 2023-11-16 DIAGNOSIS — E118 Type 2 diabetes mellitus with unspecified complications: Secondary | ICD-10-CM

## 2023-11-16 DIAGNOSIS — M160 Bilateral primary osteoarthritis of hip: Secondary | ICD-10-CM

## 2023-11-16 MED ORDER — OZEMPIC (2 MG/DOSE) 8 MG/3ML ~~LOC~~ SOPN
2.0000 mg | PEN_INJECTOR | SUBCUTANEOUS | 5 refills | Status: AC
  Filled 2023-11-16: qty 3, 28d supply, fill #0
  Filled 2023-12-17: qty 3, 28d supply, fill #1
  Filled 2023-12-19: qty 3, 28d supply, fill #2
  Filled 2024-01-16: qty 3, 28d supply, fill #3
  Filled 2024-03-03: qty 3, 28d supply, fill #4

## 2023-11-16 MED ORDER — GABAPENTIN 800 MG PO TABS
800.0000 mg | ORAL_TABLET | Freq: Three times a day (TID) | ORAL | 3 refills | Status: AC
  Filled 2023-11-16: qty 270, 90d supply, fill #0

## 2023-11-16 NOTE — Telephone Encounter (Signed)
 Reviewed and agree with Dr Rennis plan.

## 2023-11-16 NOTE — Telephone Encounter (Signed)
 Patient calls nurse line requesting to speak with Dr. Koval.   Attempted to get more information, however patient stated, you can not help me and I only want to speak with Dr. Koval.  Will forward.

## 2023-11-16 NOTE — Telephone Encounter (Signed)
 Patient contacts office to report need for TWO prescription adjustments.  He reports he has been taking Gabapentin  600mg  TID and discussed with Dr. Toma a higher dose.  The new prescription was sent for 300mg  TID.  I sent revised higher dose of 800mg  TID for this patient as a next higher dose.  We discussed excess sleepiness as a side effect he should be aware of with this dose increase.   Patient also requested titration to higher dose of Ozempic  (semaglutide ) - currently at 1mg  weekly.  Agreed to dose increase. Patient educated on purpose, proper use and potential adverse effects and need for dose reduction with fewer clicks.  Following instruction patient verbalized understanding of treatment plan.   New prescriptions provided.  Total time with patient call and documentation of interaction: 14 minutes.

## 2023-11-18 MED ORDER — FLUOXETINE HCL 20 MG PO CAPS
60.0000 mg | ORAL_CAPSULE | Freq: Every day | ORAL | 2 refills | Status: DC
  Filled 2023-11-18: qty 90, 30d supply, fill #0
  Filled 2023-12-17: qty 90, 30d supply, fill #1
  Filled 2024-01-21: qty 90, 30d supply, fill #2

## 2023-11-18 MED ORDER — TRAMADOL HCL 50 MG PO TABS
50.0000 mg | ORAL_TABLET | Freq: Three times a day (TID) | ORAL | 1 refills | Status: DC
  Filled 2023-11-18: qty 90, 30d supply, fill #0
  Filled 2023-12-17: qty 90, 30d supply, fill #1

## 2023-11-19 ENCOUNTER — Other Ambulatory Visit: Payer: Self-pay

## 2023-11-30 ENCOUNTER — Other Ambulatory Visit: Payer: Self-pay | Admitting: Family Medicine

## 2023-11-30 ENCOUNTER — Other Ambulatory Visit: Payer: Self-pay

## 2023-11-30 DIAGNOSIS — N401 Enlarged prostate with lower urinary tract symptoms: Secondary | ICD-10-CM

## 2023-12-03 ENCOUNTER — Other Ambulatory Visit: Payer: Self-pay

## 2023-12-03 MED ORDER — TAMSULOSIN HCL 0.4 MG PO CAPS
0.4000 mg | ORAL_CAPSULE | Freq: Every day | ORAL | 3 refills | Status: AC
  Filled 2023-12-03 – 2023-12-17 (×2): qty 90, 90d supply, fill #0
  Filled 2024-03-05: qty 90, 90d supply, fill #1

## 2023-12-05 ENCOUNTER — Other Ambulatory Visit: Payer: Self-pay

## 2023-12-10 ENCOUNTER — Other Ambulatory Visit: Payer: Self-pay

## 2023-12-11 ENCOUNTER — Other Ambulatory Visit: Payer: Self-pay

## 2023-12-12 ENCOUNTER — Other Ambulatory Visit: Payer: Self-pay

## 2023-12-17 ENCOUNTER — Other Ambulatory Visit: Payer: Self-pay

## 2023-12-18 ENCOUNTER — Other Ambulatory Visit: Payer: Self-pay

## 2023-12-19 ENCOUNTER — Other Ambulatory Visit: Payer: Self-pay

## 2024-01-16 ENCOUNTER — Other Ambulatory Visit: Payer: Self-pay | Admitting: Family Medicine

## 2024-01-16 ENCOUNTER — Other Ambulatory Visit: Payer: Self-pay

## 2024-01-16 DIAGNOSIS — M160 Bilateral primary osteoarthritis of hip: Secondary | ICD-10-CM

## 2024-01-16 MED ORDER — TRAMADOL HCL 50 MG PO TABS
50.0000 mg | ORAL_TABLET | Freq: Three times a day (TID) | ORAL | 0 refills | Status: DC
  Filled 2024-01-16: qty 90, 30d supply, fill #0

## 2024-01-21 ENCOUNTER — Other Ambulatory Visit: Payer: Self-pay

## 2024-01-21 ENCOUNTER — Other Ambulatory Visit: Payer: Self-pay | Admitting: Family Medicine

## 2024-01-21 DIAGNOSIS — M160 Bilateral primary osteoarthritis of hip: Secondary | ICD-10-CM

## 2024-01-21 MED ORDER — DIVALPROEX SODIUM 500 MG PO DR TAB
1500.0000 mg | DELAYED_RELEASE_TABLET | Freq: Two times a day (BID) | ORAL | 2 refills | Status: AC
  Filled 2024-01-21: qty 180, 30d supply, fill #0

## 2024-01-21 MED ORDER — BACLOFEN 10 MG PO TABS
5.0000 mg | ORAL_TABLET | Freq: Three times a day (TID) | ORAL | 0 refills | Status: DC
  Filled 2024-01-21: qty 45, 30d supply, fill #0

## 2024-01-22 ENCOUNTER — Other Ambulatory Visit: Payer: Self-pay

## 2024-02-11 ENCOUNTER — Ambulatory Visit

## 2024-02-19 ENCOUNTER — Other Ambulatory Visit: Payer: Self-pay

## 2024-02-27 ENCOUNTER — Ambulatory Visit

## 2024-02-27 ENCOUNTER — Telehealth: Payer: Self-pay

## 2024-02-27 VITALS — Ht 67.0 in | Wt 255.0 lb

## 2024-02-27 DIAGNOSIS — Z0000 Encounter for general adult medical examination without abnormal findings: Secondary | ICD-10-CM | POA: Diagnosis not present

## 2024-02-27 DIAGNOSIS — Z5941 Food insecurity: Secondary | ICD-10-CM | POA: Diagnosis not present

## 2024-02-27 NOTE — Patient Instructions (Signed)
 Mr. Drummonds,  Thank you for taking the time for your Medicare Wellness Visit. I appreciate your continued commitment to your health goals. Please review the care plan we discussed, and feel free to reach out if I can assist you further.  Please note that Annual Wellness Visits do not include a physical exam. Some assessments may be limited, especially if the visit was conducted virtually. If needed, we may recommend an in-person follow-up with your provider.  Ongoing Care Seeing your primary care provider every 3 to 6 months helps us  monitor your health and provide consistent, personalized care.   Referrals If a referral was made during today's visit and you haven't received any updates within two weeks, please contact the referred provider directly to check on the status.  Recommended Screenings:  Health Maintenance  Topic Date Due   Medicare Annual Wellness Visit  Never done   Eye exam for diabetics  Never done   Colon Cancer Screening  Never done   Zoster (Shingles) Vaccine (1 of 2) Never done   Pneumococcal Vaccine for age over 34 (2 of 2 - PCV) 07/06/2013   COVID-19 Vaccine (4 - 2025-26 season) 10/01/2023   Flu Shot  04/29/2024*   Kidney health urinalysis for diabetes  04/17/2024   Hemoglobin A1C  04/17/2024   Yearly kidney function blood test for diabetes  10/18/2024   Complete foot exam   10/18/2024   DTaP/Tdap/Td vaccine (2 - Td or Tdap) 09/10/2030   HPV Vaccine (No Doses Required) Completed   Hepatitis C Screening  Completed   HIV Screening  Completed   Hepatitis B Vaccine  Aged Out   Meningitis B Vaccine  Aged Out  *Topic was postponed. The date shown is not the original due date.       02/27/2024    3:51 PM  Advanced Directives  Does Patient Have a Medical Advance Directive? No  Would patient like information on creating a medical advance directive? No - Patient declined    Vision: Annual vision screenings are recommended for early detection of glaucoma,  cataracts, and diabetic retinopathy. These exams can also reveal signs of chronic conditions such as diabetes and high blood pressure.  Dental: Annual dental screenings help detect early signs of oral cancer, gum disease, and other conditions linked to overall health, including heart disease and diabetes.  Please see the attached documents for additional preventive care recommendation.

## 2024-02-27 NOTE — Progress Notes (Cosign Needed Addendum)
 "   Chief Complaint  Patient presents with   Medicare Wellness    Inital     Subjective:   Cody Nolan is a 64 y.o. male who presents for a Medicare Annual Wellness Visit.  Visit info / Clinical Intake: Medicare Wellness Visit Type:: Initial Annual Wellness Visit Persons participating in visit and providing information:: patient Medicare Wellness Visit Mode:: Telephone If telephone:: video declined Since this visit was completed virtually, some vitals may be partially provided or unavailable. Missing vitals are due to the limitations of the virtual format.: Documented vitals are patient reported If Telephone or Video please confirm:: I connected with patient using audio/video enable telemedicine. I verified patient identity with two identifiers, discussed telehealth limitations, and patient agreed to proceed. Patient Location:: Home Provider Location:: Home Office Interpreter Needed?: No Pre-visit prep was completed: yes AWV questionnaire completed by patient prior to visit?: no Living arrangements:: other (Feels safe) Patient's Overall Health Status Rating: (!) fair Typical amount of pain: (!) a lot Does pain affect daily life?: (!) yes Are you currently prescribed opioids?: no  Dietary Habits and Nutritional Risks How many meals a day?: 2 Eats fruit and vegetables daily?: (!) no Most meals are obtained by: preparing own meals In the last 2 weeks, have you had any of the following?: none Diabetic:: (!) yes Any non-healing wounds?: no How often do you check your BS?: as needed Would you like to be referred to a Nutritionist or for Diabetic Management? : no  Functional Status Activities of Daily Living (to include ambulation/medication): (!) Needs Assist Feeding: Independent Dressing/Grooming: Independent Bathing: Independent Toileting: Independent Transfer: Independent Ambulation: Independent with device- listed below Medication Administration: Independent Home  Management (perform basic housework or laundry): Dependent (SHOPPING, LIFTING, WEAKNESS AND PAINS IN LEGS, HX OF CVA WITH WEAK LEFT HAND SIDE) Manage your own finances?: yes Primary transportation is: driving Concerns about vision?: (!) yes Concerns about hearing?: (!) yes Uses hearing aids?: no  Fall Screening Falls in the past year?: 1 Number of falls in past year: 0 Was there an injury with Fall?: 0 Fall Risk Category Calculator: 1 Patient Fall Risk Level: Low Fall Risk  Fall Risk Patient at Risk for Falls Due to: Impaired balance/gait; Impaired mobility Fall risk Follow up: Falls evaluation completed; Education provided  Home and Transportation Safety: All rugs have non-skid backing?: N/A, no rugs All stairs or steps have railings?: N/A, no stairs Grab bars in the bathtub or shower?: (!) no Have non-skid surface in bathtub or shower?: (!) no Good home lighting?: yes Regular seat belt use?: yes Hospital stays in the last year:: no  Cognitive Assessment Difficulty concentrating, remembering, or making decisions? : yes (DUE TO STROKE) Will 6CIT or Mini Cog be Completed: yes What year is it?: 0 points What month is it?: 0 points Give patient an address phrase to remember (5 components): [place] [address] About what time is it?: 0 points Count backwards from 20 to 1: 0 points Say the months of the year in reverse: 0 points Repeat the address phrase from earlier: 0 points 6 CIT Score: 0 points  Advance Directives (For Healthcare) Does Patient Have a Medical Advance Directive?: No Would patient like information on creating a medical advance directive?: No - Patient declined  Reviewed/Updated  Reviewed/Updated: Reviewed All (Medical, Surgical, Family, Medications, Allergies, Care Teams, Patient Goals)    Allergies (verified) Aleve [naproxen sodium], Lisinopril , and Metformin  and related   Current Medications (verified) Outpatient Encounter Medications as  of  02/27/2024  Medication Sig   Accu-Chek Softclix Lancets lancets Use as instructed   atorvastatin  (LIPITOR) 80 MG tablet Take 1 tablet (80 mg total) by mouth daily.   baclofen  (LIORESAL ) 10 MG tablet Take 0.5 tablets (5 mg total) by mouth 3 (three) times daily.   busPIRone  (BUSPAR ) 10 MG tablet Take 1 tablet (10 mg total) by mouth 3 (three) times daily.   divalproex  (DEPAKOTE ) 500 MG DR tablet Take 3 tablets (1,500 mg total) by mouth every 12 (twelve) hours. Please CALL the Family Medicine Center at [phone] to schedule an appointment prior to your next refill.   FLUoxetine  (PROZAC ) 20 MG capsule Take 3 capsules (60 mg total) by mouth daily.Patient needs appointment prior to next refill, please ask him to call and schedule at [facility].   fluticasone  (FLONASE ) 50 MCG/ACT nasal spray Place 2 sprays into both nostrils daily.   gabapentin  (NEURONTIN ) 800 MG tablet Take 1 tablet (800 mg total) by mouth 3 (three) times daily.   glucose blood (ACCU-CHEK GUIDE TEST) test strip Use as instructed   hydrOXYzine  (ATARAX ) 25 MG tablet Take 1 tablet (25 mg total) by mouth 3 (three) times daily as needed for anxiety.   ibuprofen  (ADVIL ) 600 MG tablet Take 1 tablet (600 mg total) by mouth every 8 (eight) hours as needed.   insulin  degludec (TRESIBA  FLEXTOUCH) 100 UNIT/ML FlexTouch Pen Inject 26 Units into the skin daily.   Insulin  Pen Needle 31G X 5 MM MISC Use as directed.   melatonin 5 MG TABS Take 1 tablet (5 mg total) by mouth at bedtime as needed.   metoprolol  succinate (TOPROL -XL) 25 MG 24 hr tablet Take 0.5 tablets (12.5 mg total) by mouth at bedtime.   omeprazole  (PRILOSEC) 40 MG capsule Take 1 capsule (40 mg total) by mouth daily.   QUEtiapine  (SEROQUEL  XR) 300 MG 24 hr tablet Take 1 tablet (300 mg total) by mouth at bedtime.   QUEtiapine  (SEROQUEL ) 25 MG tablet Take 1 tablet (25 mg total) by mouth 2 (two) times daily as needed (agitation, anxiety). Please CALL the [facility] Center at [phone] to  schedule an appointment prior to your next refill.   Semaglutide , 2 MG/DOSE, (OZEMPIC , 2 MG/DOSE,) 8 MG/3ML SOPN Inject 2 mg into the skin once a week.   tamsulosin  (FLOMAX ) 0.4 MG CAPS capsule Take 1 capsule (0.4 mg total) by mouth daily.   tiZANidine  (ZANAFLEX ) 4 MG tablet Take 1 tablet (4 mg total) by mouth 2 (two) times daily as needed. Please CALL the Family [facility] Center at [phone] to schedule an appointment prior to your next refill.   traMADol  (ULTRAM ) 50 MG tablet Take 1 tablet (50 mg total) by mouth 3 (three) times daily.   [DISCONTINUED] ipratropium (ATROVENT ) 0.02 % nebulizer solution Take 2.5 mLs (0.5 mg total) by nebulization every 6 (six) hours as needed.   No facility-administered encounter medications on file as of 02/27/2024.    History: Past Medical History:  Diagnosis Date   CHF (congestive heart failure) (HCC)    Depression    Diabetes mellitus    Hypertension    Myocardial infarction [facility])    Osteoarthritis, hip, bilateral 06/14/2022   Sleep apnea    Sleep apnea    Tachycardia    Past Surgical History:  Procedure Laterality Date   CHOLECYSTECTOMY     knee  3 knee surgeries   Family History  Problem Relation Age of Onset   Heart disease Mother    Hypertension Other  Social History   Occupational History   Not on file  Tobacco Use   Smoking status: Former    Current packs/day: 0.00    Average packs/day: 0.8 packs/day for 10.0 years (7.5 ttl pk-yrs)    Types: Cigarettes    Start date: 07/01/2010    Quit date: 06/30/2020    Years since quitting: 3.6   Smokeless tobacco: Never   Tobacco comments:    Quit - [place] in Spring 2022  Substance and Sexual Activity   Alcohol use: Yes    Comment: 2 cans of beer a month   Drug use: No   Sexual activity: Not Currently    Partners: Female   Tobacco Counseling Counseling given: Not Answered Tobacco comments: Quit - [place] in Spring 2022  SDOH Screenings   Food Insecurity: Food  Insecurity Present (02/27/2024)  Housing: Low Risk (02/27/2024)  Transportation Needs: No Transportation Needs (02/27/2024)  Utilities: Not At Risk (02/27/2024)  Alcohol Screen: Low Risk (02/27/2024)  Depression (PHQ2-9): Low Risk (02/27/2024)  Financial Resource Strain: Medium Risk (02/27/2024)  Physical Activity: Inactive (02/27/2024)  Social Connections: Socially Isolated (02/27/2024)  Stress: No Stress Concern Present (02/27/2024)  Tobacco Use: Medium Risk (02/27/2024)  Health Literacy: Adequate Health Literacy (02/27/2024)   See flowsheets for full screening details  Depression Screen PHQ 2 & 9 Depression Scale- Over the past 2 weeks, how often have you been bothered by any of the following problems? Little interest or pleasure in doing things: 0 Feeling down, depressed, or hopeless (PHQ Adolescent also includes...irritable): 0 PHQ-2 Total Score: 0 Trouble falling or staying asleep, or sleeping too much: 0 Feeling tired or having little energy: 0 Poor appetite or overeating (PHQ Adolescent also includes...weight loss): 0 Feeling bad about yourself - or that you are a failure or have let yourself or your family down: 0 Trouble concentrating on things, such as reading the newspaper or watching television (PHQ Adolescent also includes...like school work): 0 Moving or speaking so slowly that other people could have noticed. Or the opposite - being so fidgety or restless that you have been moving around a lot more than usual: 0 Thoughts that you would be better off dead, or of hurting yourself in some way: 0 PHQ-9 Total Score: 0 If you checked off any problems, how difficult have these problems made it for you to do your work, take care of things at home, or get along with other people?: Not difficult at all  Depression Treatment Depression Interventions/Treatment : EYV7-0 Score <4 Follow-up Not Indicated     Goals Addressed             This Visit's Progress    02/27/2024: Patient stated        My goal for 2026 is to lose 40 pounds. Be more mobile and be pain free. Start going to pain management. Join the [employer] for physical activity.             Objective:    Today's Vitals   02/27/24 1546  Weight: 255 lb (115.7 kg)  Height: 5' 7 (1.702 m)  PainSc: 10-Worst pain ever  PainLoc: Generalized   Body mass index is 39.94 kg/m.  Hearing/Vision screen Hearing Screening - Comments:: Some hearing issues, no hearing aids. Defer to PCP Vision Screening - Comments:: No eyeglasses, vision is getting worse.  Defer to PCP Immunizations and Health Maintenance Health Maintenance  Topic Date Due   OPHTHALMOLOGY EXAM  Never done   Colonoscopy  Never  done   Zoster Vaccines- Shingrix (1 of 2) Never done   Pneumococcal Vaccine: 50+ Years (2 of 2 - PCV) 07/06/2013   COVID-19 Vaccine (4 - 2025-26 season) 10/01/2023   Influenza Vaccine  04/29/2024 (Originally 08/31/2023)   Diabetic kidney evaluation - Urine ACR  04/17/2024   HEMOGLOBIN A1C  04/17/2024   Diabetic kidney evaluation - eGFR measurement  10/18/2024   FOOT EXAM  10/18/2024   Medicare Annual Wellness (AWV)  02/26/2025   DTaP/Tdap/Td (2 - Td or Tdap) 09/10/2030   HPV VACCINES (No Doses Required) Completed   Hepatitis C Screening  Completed   HIV Screening  Completed   Hepatitis B Vaccines 19-59 Average Risk  Aged Out   Meningococcal B Vaccine  Aged Out        Assessment/Plan:  This is a routine wellness examination for Cody Nolan.  Patient Care Team: Toma Matas, MD as PCP - General (Family Medicine)  I have personally reviewed and noted the following in the patients chart:   Medical and social history Use of alcohol, tobacco or illicit drugs  Current medications and supplements including opioid prescriptions. Functional ability and status Nutritional status Physical activity Advanced directives List of other physicians Hospitalizations, surgeries, and ER visits in previous 12  months Vitals Screenings to include cognitive, depression, and falls Referrals and appointments  No orders of the defined types were placed in this encounter.  In addition, I have reviewed and discussed with patient certain preventive protocols, quality metrics, and best practice recommendations. A written personalized care plan for preventive services as well as general preventive health recommendations were provided to patient.   Cody LOISE Fuller, LPN   8/71/7973   Return in about 1 year (around 02/26/2025) for Medicare wellness.  After Visit Summary: (Declined) Due to this being a telephonic visit, with patients personalized plan was offered to patient but patient Declined AVS at this time   Nurse Notes:  Patient needs a referral to in house Social Worker for food insecurity. Appointment(s) made: (04/14/2024 at 1:30 pm with PCP) Separate telephone note sent for (referrals to pain management, [facility] and [facility] for screening colonoscopy.) HM Addressed: Vaccines Due: Pneumococcal, Shingrix and Covid vaccines.   "

## 2024-02-27 NOTE — Telephone Encounter (Signed)
 During this patient's AWV, he stated that he needed assistance with the following: Patient is wanting to know if he qualifies for in home care to help with ADLs due to his severe pain in his bilateral hips and lower back.  Patient stated that he can not perform ADL's because he is not able to stand for long periods of time.  Patient is requesting a rx for 4-prong cane to help with unsteady gait/balance.  Patient is requesting a referral to [facility] to help monitor his pain.  He is requesting narcotic pain patches.  Patient stated that he stays in the bed majority of the day due to pain.  Today is pain score was 10/10.  He takes Tramadol , but he said that is not giving him the ability to do the regular things during the day, such as cooking, cleaning, shopping.  Patient also is overdue for diabetic eye exam and would like to be referred to [facility] or Dr. Arley Ruder for a detailed diabetic check on his eyes. Patient needs to be referred to ENT to evaluate his hearing, since he stated that his hearing has decreased in both ears.  Please advise.

## 2024-02-28 ENCOUNTER — Telehealth: Payer: Self-pay

## 2024-02-28 NOTE — Telephone Encounter (Signed)
 Called and LVM for patient to call office to schedule an appointment per Dr. Toma.  Cena JONELLE Pesa, CMA

## 2024-02-28 NOTE — Progress Notes (Signed)
 Complex Care Management Note  Care Guide Note 02/28/2024 Name: Cody Nolan MRN: [id number] DOB: 1960/11/23  Cody Nolan is a 64 y.o. year old male who sees Shitarev, Dimitry, MD for primary care. I reached out to [name] by phone today to offer complex care management services.  Cody Nolan was given information about Complex Care Management services today including:   The Complex Care Management services include support from the care team which includes your Nurse Care Manager, Clinical Social Worker, or Pharmacist.  The Complex Care Management team is here to help remove barriers to the health concerns and goals most important to you. Complex Care Management services are voluntary, and the patient may decline or stop services at any time by request to their care team member.   Complex Care Management Consent Status: Patient agreed to services and verbal consent obtained.   Follow up plan:  Telephone appointment with complex care management team member scheduled for:  03/27/24 @ 1 PM  Encounter Outcome:  Patient Scheduled  Leotis Rase [facility], [facility] Guide  Direct Dial : [phone]  Fax [phone]

## 2024-03-03 ENCOUNTER — Other Ambulatory Visit: Payer: Self-pay | Admitting: Family Medicine

## 2024-03-03 ENCOUNTER — Other Ambulatory Visit: Payer: Self-pay

## 2024-03-03 DIAGNOSIS — M160 Bilateral primary osteoarthritis of hip: Secondary | ICD-10-CM

## 2024-03-03 DIAGNOSIS — E7800 Pure hypercholesterolemia, unspecified: Secondary | ICD-10-CM

## 2024-03-04 ENCOUNTER — Other Ambulatory Visit: Payer: Self-pay

## 2024-03-04 MED ORDER — BACLOFEN 10 MG PO TABS
5.0000 mg | ORAL_TABLET | Freq: Three times a day (TID) | ORAL | 0 refills | Status: AC
  Filled 2024-03-04: qty 45, 30d supply, fill #0

## 2024-03-04 MED ORDER — TRAMADOL HCL 50 MG PO TABS
50.0000 mg | ORAL_TABLET | Freq: Three times a day (TID) | ORAL | 0 refills | Status: AC
  Filled 2024-03-04: qty 90, 30d supply, fill #0

## 2024-03-04 MED ORDER — ATORVASTATIN CALCIUM 80 MG PO TABS
80.0000 mg | ORAL_TABLET | Freq: Every day | ORAL | 3 refills | Status: AC
  Filled 2024-03-04: qty 90, 90d supply, fill #0

## 2024-03-04 MED ORDER — HYDROXYZINE HCL 25 MG PO TABS
25.0000 mg | ORAL_TABLET | Freq: Three times a day (TID) | ORAL | 3 refills | Status: AC | PRN
  Filled 2024-03-04: qty 90, 30d supply, fill #0

## 2024-03-04 MED ORDER — FLUOXETINE HCL 20 MG PO CAPS
60.0000 mg | ORAL_CAPSULE | Freq: Every day | ORAL | 2 refills | Status: AC
  Filled 2024-03-04 (×2): qty 90, 30d supply, fill #0

## 2024-03-04 NOTE — Telephone Encounter (Signed)
 Please call patient and ask him to schedule appointment to follow up on his medicines.  I have refilled everything for the next month.

## 2024-03-05 ENCOUNTER — Other Ambulatory Visit: Payer: Self-pay

## 2024-03-07 ENCOUNTER — Other Ambulatory Visit: Payer: Self-pay

## 2024-03-27 ENCOUNTER — Telehealth: Admitting: Licensed Clinical Social Worker

## 2024-04-03 ENCOUNTER — Ambulatory Visit

## 2024-04-14 ENCOUNTER — Ambulatory Visit: Payer: Self-pay | Admitting: Family Medicine
